# Patient Record
Sex: Female | Born: 1955 | Race: White | Hispanic: No | Smoking: Never smoker
Health system: Southern US, Community
[De-identification: ages and names within clinical notes are randomized; demographics above are authoritative.]

## PROBLEM LIST (undated history)

## (undated) DIAGNOSIS — K900 Celiac disease: Secondary | ICD-10-CM

## (undated) DIAGNOSIS — F50819 Binge eating disorder, unspecified: Secondary | ICD-10-CM

## (undated) DIAGNOSIS — E119 Type 2 diabetes mellitus without complications: Secondary | ICD-10-CM

## (undated) DIAGNOSIS — F419 Anxiety disorder, unspecified: Secondary | ICD-10-CM

## (undated) DIAGNOSIS — F32A Depression, unspecified: Secondary | ICD-10-CM

## (undated) DIAGNOSIS — F4310 Post-traumatic stress disorder, unspecified: Secondary | ICD-10-CM

## (undated) DIAGNOSIS — E7800 Pure hypercholesterolemia, unspecified: Secondary | ICD-10-CM

## (undated) DIAGNOSIS — M545 Low back pain, unspecified: Secondary | ICD-10-CM

## (undated) DIAGNOSIS — F319 Bipolar disorder, unspecified: Secondary | ICD-10-CM

## (undated) DIAGNOSIS — G4733 Obstructive sleep apnea (adult) (pediatric): Secondary | ICD-10-CM

## (undated) DIAGNOSIS — G43909 Migraine, unspecified, not intractable, without status migrainosus: Secondary | ICD-10-CM

## (undated) DIAGNOSIS — J45909 Unspecified asthma, uncomplicated: Secondary | ICD-10-CM

## (undated) DIAGNOSIS — M1990 Unspecified osteoarthritis, unspecified site: Secondary | ICD-10-CM

## (undated) DIAGNOSIS — F329 Major depressive disorder, single episode, unspecified: Secondary | ICD-10-CM

## (undated) DIAGNOSIS — J189 Pneumonia, unspecified organism: Secondary | ICD-10-CM

## (undated) DIAGNOSIS — D649 Anemia, unspecified: Secondary | ICD-10-CM

## (undated) DIAGNOSIS — I10 Essential (primary) hypertension: Secondary | ICD-10-CM

## (undated) DIAGNOSIS — G8929 Other chronic pain: Secondary | ICD-10-CM

## (undated) DIAGNOSIS — K589 Irritable bowel syndrome without diarrhea: Secondary | ICD-10-CM

## (undated) DIAGNOSIS — F5081 Binge eating disorder: Secondary | ICD-10-CM

## (undated) DIAGNOSIS — K219 Gastro-esophageal reflux disease without esophagitis: Secondary | ICD-10-CM

## (undated) DIAGNOSIS — F988 Other specified behavioral and emotional disorders with onset usually occurring in childhood and adolescence: Secondary | ICD-10-CM

## (undated) DIAGNOSIS — E0590 Thyrotoxicosis, unspecified without thyrotoxic crisis or storm: Secondary | ICD-10-CM

## (undated) HISTORY — DX: Type 2 diabetes mellitus without complications: E11.9

## (undated) HISTORY — DX: Other specified behavioral and emotional disorders with onset usually occurring in childhood and adolescence: F98.8

## (undated) HISTORY — PX: COLPOSCOPY: SHX161

## (undated) HISTORY — DX: Pure hypercholesterolemia, unspecified: E78.00

## (undated) HISTORY — PX: HAND SURGERY: SHX662

## (undated) HISTORY — DX: Binge eating disorder, unspecified: F50.819

## (undated) HISTORY — DX: Obstructive sleep apnea (adult) (pediatric): G47.33

## (undated) HISTORY — PX: OTHER SURGICAL HISTORY: SHX169

## (undated) HISTORY — DX: Migraine, unspecified, not intractable, without status migrainosus: G43.909

## (undated) HISTORY — DX: Low back pain, unspecified: M54.50

## (undated) HISTORY — DX: Other chronic pain: G89.29

## (undated) HISTORY — DX: Unspecified asthma, uncomplicated: J45.909

## (undated) HISTORY — DX: Binge eating disorder: F50.81

## (undated) HISTORY — PX: SHOULDER SURGERY: SHX246

## (undated) HISTORY — DX: Post-traumatic stress disorder, unspecified: F43.10

## (undated) HISTORY — DX: Thyrotoxicosis, unspecified without thyrotoxic crisis or storm: E05.90

---

## 2015-08-01 ENCOUNTER — Emergency Department (HOSPITAL_COMMUNITY)
Admission: EM | Admit: 2015-08-01 | Discharge: 2015-08-01 | Disposition: A | Discharge status: Home / Self Care | Payer: Self-pay | Attending: Emergency Medicine | Admitting: Emergency Medicine

## 2015-08-01 ENCOUNTER — Encounter (HOSPITAL_COMMUNITY): Payer: Self-pay | Admitting: General Practice

## 2015-08-01 ENCOUNTER — Emergency Department (HOSPITAL_COMMUNITY): Payer: Self-pay

## 2015-08-01 DIAGNOSIS — S92152A Displaced avulsion fracture (chip fracture) of left talus, initial encounter for closed fracture: Secondary | ICD-10-CM | POA: Insufficient documentation

## 2015-08-01 DIAGNOSIS — W109XXA Fall (on) (from) unspecified stairs and steps, initial encounter: Secondary | ICD-10-CM | POA: Insufficient documentation

## 2015-08-01 DIAGNOSIS — Y9301 Activity, walking, marching and hiking: Secondary | ICD-10-CM | POA: Insufficient documentation

## 2015-08-01 DIAGNOSIS — Y92239 Unspecified place in hospital as the place of occurrence of the external cause: Secondary | ICD-10-CM | POA: Insufficient documentation

## 2015-08-01 DIAGNOSIS — S93402A Sprain of unspecified ligament of left ankle, initial encounter: Secondary | ICD-10-CM | POA: Insufficient documentation

## 2015-08-01 DIAGNOSIS — I10 Essential (primary) hypertension: Secondary | ICD-10-CM | POA: Insufficient documentation

## 2015-08-01 DIAGNOSIS — Z8659 Personal history of other mental and behavioral disorders: Secondary | ICD-10-CM | POA: Insufficient documentation

## 2015-08-01 DIAGNOSIS — Y998 Other external cause status: Secondary | ICD-10-CM | POA: Insufficient documentation

## 2015-08-01 HISTORY — DX: Depression, unspecified: F32.A

## 2015-08-01 HISTORY — DX: Essential (primary) hypertension: I10

## 2015-08-01 HISTORY — DX: Bipolar disorder, unspecified: F31.9

## 2015-08-01 HISTORY — DX: Anxiety disorder, unspecified: F41.9

## 2015-08-01 HISTORY — DX: Major depressive disorder, single episode, unspecified: F32.9

## 2015-08-01 MED ORDER — HYDROCODONE-ACETAMINOPHEN 5-325 MG PO TABS: 1.0000 | ORAL_TABLET | Freq: Four times a day (QID) | ORAL | Status: DC | PRN

## 2015-08-01 MED ORDER — HYDROCODONE-ACETAMINOPHEN 5-325 MG PO TABS
2.0000 | ORAL_TABLET | Freq: Once | ORAL | Status: AC
  Administered 2015-08-01: 2 via ORAL
  Filled 2015-08-01: qty 2

## 2015-08-01 NOTE — Discharge Instructions (Signed)

## 2015-08-01 NOTE — Progress Notes (Signed)
Orthopedic Tech Progress Note Patient Details:  Paige Andrews [birth year] [id number]  Ortho Devices Type of Ortho Device: Ace wrap, Post (short leg) splint Ortho Device/Splint Location: LLE Ortho Device/Splint Interventions: Ordered, Application   Jennye Moccasin 08/01/2015, 8:52 PM

## 2015-08-01 NOTE — ED Notes (Signed)
Pt brought in via [facility] with complaints of left ankle and left knee pain. Pt was picking up boyfriend at [facility] hospital and tripped over a step and fell. Pt went home, hoping the pain would subside. Pt denies any loss of consciousness. Pt able to move all extremities. Pt has palpable pedal pulses and sensation. Pt has a large old bruise on left hip from a previous fall.

## 2015-08-01 NOTE — ED Notes (Signed)
Ortho paged and called back- informed of need for LEFT short posterior leg splint application

## 2015-08-01 NOTE — ED Notes (Signed)
Ortho tech at bedside 
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
## 2015-09-04 ENCOUNTER — Emergency Department (HOSPITAL_BASED_OUTPATIENT_CLINIC_OR_DEPARTMENT_OTHER): Payer: Self-pay

## 2015-09-04 ENCOUNTER — Encounter (HOSPITAL_BASED_OUTPATIENT_CLINIC_OR_DEPARTMENT_OTHER): Payer: Self-pay | Admitting: *Deleted

## 2015-09-04 ENCOUNTER — Emergency Department (HOSPITAL_BASED_OUTPATIENT_CLINIC_OR_DEPARTMENT_OTHER)
Admission: EM | Admit: 2015-09-04 | Discharge: 2015-09-04 | Disposition: A | Discharge status: Home / Self Care | Payer: Self-pay | Attending: Emergency Medicine | Admitting: Emergency Medicine

## 2015-09-04 DIAGNOSIS — Y998 Other external cause status: Secondary | ICD-10-CM | POA: Insufficient documentation

## 2015-09-04 DIAGNOSIS — F419 Anxiety disorder, unspecified: Secondary | ICD-10-CM | POA: Insufficient documentation

## 2015-09-04 DIAGNOSIS — T148XXA Other injury of unspecified body region, initial encounter: Secondary | ICD-10-CM

## 2015-09-04 DIAGNOSIS — R202 Paresthesia of skin: Secondary | ICD-10-CM | POA: Insufficient documentation

## 2015-09-04 DIAGNOSIS — Y9389 Activity, other specified: Secondary | ICD-10-CM | POA: Insufficient documentation

## 2015-09-04 DIAGNOSIS — S60221A Contusion of right hand, initial encounter: Secondary | ICD-10-CM | POA: Insufficient documentation

## 2015-09-04 DIAGNOSIS — S63616A Unspecified sprain of right little finger, initial encounter: Secondary | ICD-10-CM | POA: Insufficient documentation

## 2015-09-04 DIAGNOSIS — S63619A Unspecified sprain of unspecified finger, initial encounter: Secondary | ICD-10-CM

## 2015-09-04 DIAGNOSIS — I10 Essential (primary) hypertension: Secondary | ICD-10-CM | POA: Insufficient documentation

## 2015-09-04 DIAGNOSIS — Y9289 Other specified places as the place of occurrence of the external cause: Secondary | ICD-10-CM | POA: Insufficient documentation

## 2015-09-04 MED ORDER — KETOROLAC TROMETHAMINE 60 MG/2ML IM SOLN
30.0000 mg | Freq: Once | INTRAMUSCULAR | Status: AC
  Administered 2015-09-04: 30 mg via INTRAMUSCULAR
  Filled 2015-09-04: qty 2

## 2015-09-04 MED ORDER — OXYCODONE-ACETAMINOPHEN 5-325 MG PO TABS
1.0000 | ORAL_TABLET | Freq: Once | ORAL | Status: DC
  Filled 2015-09-04: qty 1
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
## 2015-09-04 NOTE — Discharge Instructions (Signed)
Boxer's Knuckle Boxer's knuckle involves pain and weakness over the knuckle of an injured hand. The pain is due to injury of the tendon that straightens the finger (extensor tendon). Normally, a layer of tissue lies over the tendon to keep it in place. Damage to this tissue may allow the extensor tendon to move out of proper position (sublux). This causes the symptoms of boxer's knuckle. Boxer's knuckle often occurs at the first knuckle of the middle finger. SYMPTOMS   Pain and tenderness over the injured knuckle.  Difficulty straightening the finger, without assistance.  Full passive motion of the finger (can be moved with assistance).  Swelling and warmth of the injured finger, over the knuckle. CAUSES  Direct or repeated injury (trauma) to the knuckle, such as with boxing or martial arts, that causes damage to the extensor tendon or the tissue that holds it in place.  RISK INCREASES WITH:   Hitting or fighting sports (boxing, martial arts).  Contact sports (football, rugby).  Poor strength and flexibility.  Previous or concurrent injury to the knuckle. PREVENTION  Maintain appropriate conditioning:  Hand and finger flexibility.  Muscle strength and endurance.  Wear protective equipment (padded gloves). PROGNOSIS  This condition usually heals with splinting or surgery.  RELATED COMPLICATIONS   Permanent loss of full range of motion.  Stiffness of finger.  Weakness of the hand and finger.  Tendon rupture (tearing).  Arthritis of the joint.  Frequently recurring symptoms and repeated injury. Fixing the problem the first time may decrease the chances of recurrence and optimize healing time.  Longer healing time, if not treated properly.  Injury to other structures (bone, cartilage, tendon), and ongoing (chronic) unstableness.  Prolonged impairment (sometimes).  Risks of surgery: infection, injury to nerves (numbness, weakness), bleeding, or problem recurring after  surgery. TREATMENT  Treatment first involves restraining the joint, icing, and elevation of the finger, at or above heart level, to reduce inflammation. Medicines may be given for pain. Often, surgery is advised to repair the injured tissues. After surgery, restraint of the joint is achieved with a cast, brace, or splint. This may allow the tissues to heal. After restraint (with or without surgery), stretching and strengthening exercises are needed to regain complete use of the knuckle. Exercises may be completed at home or with the assistance of a therapist or athletic trainer. Return to sports is allowed after full range of motion and normal strength are achieved, usually after 4 months.  MEDICATION   If pain medication is necessary, nonsteroidal anti-inflammatory medications, such as aspirin and ibuprofen, or other minor pain relievers, such as acetaminophen, are often recommended.  Do not take pain medicine for 7 days before surgery.  Prescription pain relievers are usually prescribed only after surgery. Use only as directed and only as much as you need. SEEK MEDICAL CARE IF:   Pain increases, despite treatment.  Any of the following occur after surgery:  Pain, numbness, or coldness in the finger.  Blue, gray, or dark color appears in the fingernails.  Signs of infection: fever, increased pain, swelling, redness, drainage of fluids, or bleeding in the affected area.  New, unexplained symptoms develop. (Drugs used in treatment may produce side effects.) Document Released: 11/27/2005 Document Revised: 02/19/2012 Document Reviewed: 03/11/2009 [facility] Patient Information 2015 [place], [place]. This information is not intended to replace advice given to you by your health care provider. Make sure you discuss any questions you have with your health care provider.

## 2015-09-04 NOTE — ED Notes (Signed)
Patient transported to X-ray 

## 2015-09-08 ENCOUNTER — Ambulatory Visit: Payer: Self-pay | Admitting: Family Medicine

## 2015-09-10 ENCOUNTER — Ambulatory Visit: Payer: Self-pay | Admitting: Family Medicine

## 2015-09-13 ENCOUNTER — Encounter: Payer: Self-pay | Admitting: Family Medicine

## 2015-09-13 ENCOUNTER — Ambulatory Visit (INDEPENDENT_AMBULATORY_CARE_PROVIDER_SITE_OTHER): Payer: Self-pay | Admitting: Family Medicine

## 2015-09-13 VITALS — BP 123/89 | HR 111 | Ht 64.0 in | Wt 200.0 lb

## 2015-09-13 DIAGNOSIS — R21 Rash and other nonspecific skin eruption: Secondary | ICD-10-CM

## 2015-09-13 DIAGNOSIS — S6991XA Unspecified injury of right wrist, hand and finger(s), initial encounter: Secondary | ICD-10-CM

## 2015-09-13 MED ORDER — PERMETHRIN 5 % EX CREA: 1.0000 "application " | TOPICAL_CREAM | Freq: Once | CUTANEOUS | Status: DC

## 2015-09-13 MED ORDER — TRAMADOL HCL 50 MG PO TABS
50.0000 mg | ORAL_TABLET | Freq: Four times a day (QID) | ORAL | Status: DC | PRN
Start: 2015-09-13 — End: 2015-10-07

## 2015-09-13 NOTE — Patient Instructions (Signed)
Ice as needed 15 minutes at a time 3-4 times a day. Ibuprofen  three times a day with food as needed for pain and inflammation. Tylenol  1-2 tabs three times a day as needed for pain. Tramadol as needed for severe pain - no driving on this medicine though. Work on regaining your finger motion with easy flexion, extension exercises. Follow up with me in 2 weeks. Consider buddy taping your little and ring fingers together if needed.

## 2015-09-14 DIAGNOSIS — S6991XA Unspecified injury of right wrist, hand and finger(s), initial encounter: Secondary | ICD-10-CM | POA: Insufficient documentation

## 2015-09-14 DIAGNOSIS — R21 Rash and other nonspecific skin eruption: Secondary | ICD-10-CM | POA: Insufficient documentation

## 2015-09-14 NOTE — Assessment & Plan Note (Signed)
Lower extremity rash - intensely pruritic rash only lower legs below knees.  Advised we trial permethrin topically to these areas - if she notices this resolves the rash advised to use over entire body, wash sheets and clothes in hot water, place aside in plastic bag for 2 weeks.
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
## 2015-09-14 NOTE — Assessment & Plan Note (Signed)
radiographs show possible 5th proximal phalanx fracture but otherwise normal.  Advised icing, ibuprofen, tylenol, tramadol as needed.  Reviewed buddy taping of 4th and 5th digits.  Start easy motion exercises now, discontinue immobilization.  F/u in 2 weeks to reevaluate.

## 2015-09-27 ENCOUNTER — Ambulatory Visit: Payer: Self-pay | Admitting: Family Medicine

## 2015-10-07 ENCOUNTER — Ambulatory Visit (INDEPENDENT_AMBULATORY_CARE_PROVIDER_SITE_OTHER): Payer: Self-pay | Admitting: Family Medicine

## 2015-10-07 ENCOUNTER — Encounter: Payer: Self-pay | Admitting: Family Medicine

## 2015-10-07 VITALS — BP 130/92 | HR 101 | Ht 64.0 in | Wt 200.0 lb

## 2015-10-07 DIAGNOSIS — S6991XD Unspecified injury of right wrist, hand and finger(s), subsequent encounter: Secondary | ICD-10-CM

## 2015-10-07 MED ORDER — NORTRIPTYLINE HCL 25 MG PO CAPS: 25.0000 mg | ORAL_CAPSULE | Freq: Every day | ORAL | Status: DC

## 2015-10-07 NOTE — Patient Instructions (Signed)
Try nortriptyline at bedtime for pain - can make you sleepy. Ice as needed 15 minutes at a time 3-4 times a day. Ibuprofen 600mg  three times a day with food as needed for pain and inflammation. Tylenol 500mg  1-2 tabs three times a day as needed for pain. We will try the extension splint - wear at all times for next 6 weeks - if you take this off, keep the joint extended and wash gently. Follow up with me in 6 weeks though if you tolerate the nortriptyline you can call me sooner to increase the dose. We can consider an MRI, physical therapy depending on insurance coverage in the future.
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
## 2015-10-12 NOTE — Assessment & Plan Note (Signed)
radiographs showed possible 5th proximal phalanx fracture but otherwise normal.  She has some difficulty with extension at 5th PIP now though able to do so against resistance.  Advised as a precaution would extension splint at this joint for 6 weeks regularly (at all times).  Icing, tylenol, ibuprofen.  She asked about something additional for pain - getting shooting pains up the arm.  We will try nortriptyline at a low dose at bedtime.  F/u in 6 weeks.  Consider MRI, physical therapy if not improving.

## 2015-10-15 ENCOUNTER — Telehealth: Payer: Self-pay | Admitting: Family Medicine

## 2015-10-15 NOTE — Telephone Encounter (Signed)
She will have to establish with a family physician to discuss restless leg syndrome.  Can refer her to [facility] downstairs - all the physicians and PAs, NP down there are great.  Regarding her hand, we reviewed treatment - continue the splint, tylenol, ibuprofen if needed.  We prescribed nortriptyline.  Is she taking this?  If she's tolerating it we can increase it.  We can try a different anti-inflammatory than ibuprofen also.

## 2015-10-15 NOTE — Telephone Encounter (Signed)
Unable to contact patient.

## 2015-11-18 ENCOUNTER — Ambulatory Visit: Payer: Self-pay | Admitting: Family Medicine

## 2017-01-20 ENCOUNTER — Encounter (HOSPITAL_COMMUNITY): Payer: Self-pay | Admitting: *Deleted

## 2017-01-20 ENCOUNTER — Emergency Department (HOSPITAL_COMMUNITY): Payer: Self-pay

## 2017-01-20 DIAGNOSIS — Z79899 Other long term (current) drug therapy: Secondary | ICD-10-CM

## 2017-01-20 DIAGNOSIS — K5900 Constipation, unspecified: Secondary | ICD-10-CM | POA: Diagnosis present

## 2017-01-20 DIAGNOSIS — E860 Dehydration: Secondary | ICD-10-CM | POA: Diagnosis present

## 2017-01-20 DIAGNOSIS — F319 Bipolar disorder, unspecified: Secondary | ICD-10-CM | POA: Diagnosis present

## 2017-01-20 DIAGNOSIS — F419 Anxiety disorder, unspecified: Secondary | ICD-10-CM | POA: Diagnosis present

## 2017-01-20 DIAGNOSIS — R1011 Right upper quadrant pain: Secondary | ICD-10-CM | POA: Diagnosis present

## 2017-01-20 DIAGNOSIS — E871 Hypo-osmolality and hyponatremia: Secondary | ICD-10-CM | POA: Diagnosis present

## 2017-01-20 DIAGNOSIS — E872 Acidosis: Secondary | ICD-10-CM | POA: Diagnosis present

## 2017-01-20 DIAGNOSIS — E876 Hypokalemia: Secondary | ICD-10-CM | POA: Diagnosis present

## 2017-01-20 DIAGNOSIS — R112 Nausea with vomiting, unspecified: Secondary | ICD-10-CM | POA: Diagnosis present

## 2017-01-20 DIAGNOSIS — R739 Hyperglycemia, unspecified: Secondary | ICD-10-CM | POA: Diagnosis present

## 2017-01-20 DIAGNOSIS — Z66 Do not resuscitate: Secondary | ICD-10-CM | POA: Diagnosis present

## 2017-01-20 DIAGNOSIS — K589 Irritable bowel syndrome without diarrhea: Secondary | ICD-10-CM | POA: Diagnosis present

## 2017-01-20 DIAGNOSIS — Z885 Allergy status to narcotic agent status: Secondary | ICD-10-CM

## 2017-01-20 DIAGNOSIS — Z8659 Personal history of other mental and behavioral disorders: Secondary | ICD-10-CM

## 2017-01-20 DIAGNOSIS — K869 Disease of pancreas, unspecified: Principal | ICD-10-CM | POA: Diagnosis present

## 2017-01-20 DIAGNOSIS — I10 Essential (primary) hypertension: Secondary | ICD-10-CM | POA: Diagnosis present

## 2017-01-20 DIAGNOSIS — R7989 Other specified abnormal findings of blood chemistry: Secondary | ICD-10-CM | POA: Diagnosis present

## 2017-01-20 DIAGNOSIS — Z8379 Family history of other diseases of the digestive system: Secondary | ICD-10-CM

## 2017-01-20 DIAGNOSIS — R0789 Other chest pain: Secondary | ICD-10-CM | POA: Diagnosis present

## 2017-01-20 DIAGNOSIS — R1013 Epigastric pain: Secondary | ICD-10-CM | POA: Diagnosis present

## 2017-01-20 LAB — CBC
HCT: 42.3 % (ref 36.0–46.0)
Hemoglobin: 15.4 g/dL — ABNORMAL HIGH (ref 12.0–15.0)
MCH: 34.5 pg — ABNORMAL HIGH (ref 26.0–34.0)
MCHC: 36.4 g/dL — ABNORMAL HIGH (ref 30.0–36.0)
MCV: 94.8 fL (ref 78.0–100.0)
PLATELETS: 174 10*3/uL (ref 150–400)
RBC: 4.46 MIL/uL (ref 3.87–5.11)
RDW: 13.5 % (ref 11.5–15.5)
WBC: 10.3 10*3/uL (ref 4.0–10.5)

## 2017-01-20 LAB — I-STAT TROPONIN, ED: Troponin i, poc: 0 ng/mL (ref 0.00–0.08)

## 2017-01-20 NOTE — ED Triage Notes (Signed)
Pt says that she has had pain in her abdomen, back and chest. She has had multiple episodes of vomiting. She has also had a productive cough.

## 2017-01-20 NOTE — ED Triage Notes (Signed)
PT arrives via EMS, per their report pt has had abdominal pain, n/v and body aches for 2 weeks. 154/90, hr 120, 98% room.

## 2017-01-21 ENCOUNTER — Encounter (HOSPITAL_COMMUNITY): Payer: Self-pay | Admitting: *Deleted

## 2017-01-21 ENCOUNTER — Inpatient Hospital Stay (HOSPITAL_COMMUNITY): Payer: Self-pay

## 2017-01-21 ENCOUNTER — Emergency Department (HOSPITAL_COMMUNITY): Payer: Self-pay

## 2017-01-21 ENCOUNTER — Inpatient Hospital Stay (HOSPITAL_COMMUNITY)
Admission: EM | Admit: 2017-01-21 | Discharge: 2017-01-23 | DRG: 439 | Disposition: A | Discharge status: Home / Self Care | Payer: Self-pay | Attending: Internal Medicine | Admitting: Internal Medicine

## 2017-01-21 DIAGNOSIS — K5900 Constipation, unspecified: Secondary | ICD-10-CM

## 2017-01-21 DIAGNOSIS — F418 Other specified anxiety disorders: Secondary | ICD-10-CM

## 2017-01-21 DIAGNOSIS — R52 Pain, unspecified: Secondary | ICD-10-CM

## 2017-01-21 DIAGNOSIS — R112 Nausea with vomiting, unspecified: Secondary | ICD-10-CM | POA: Diagnosis present

## 2017-01-21 DIAGNOSIS — Q453 Other congenital malformations of pancreas and pancreatic duct: Secondary | ICD-10-CM

## 2017-01-21 DIAGNOSIS — R7401 Elevation of levels of liver transaminase levels: Secondary | ICD-10-CM | POA: Diagnosis present

## 2017-01-21 DIAGNOSIS — R740 Nonspecific elevation of levels of transaminase and lactic acid dehydrogenase [LDH]: Secondary | ICD-10-CM

## 2017-01-21 HISTORY — DX: Irritable bowel syndrome, unspecified: K58.9

## 2017-01-21 HISTORY — DX: Celiac disease: K90.0

## 2017-01-21 LAB — COMPREHENSIVE METABOLIC PANEL
ALT: 119 U/L — AB (ref 14–54)
AST: 80 U/L — AB (ref 15–41)
Albumin: 4.2 g/dL (ref 3.5–5.0)
Alkaline Phosphatase: 67 U/L (ref 38–126)
Anion gap: 12 (ref 5–15)
BILIRUBIN TOTAL: 0.9 mg/dL (ref 0.3–1.2)
BUN: 9 mg/dL (ref 6–20)
CALCIUM: 9.5 mg/dL (ref 8.9–10.3)
CHLORIDE: 101 mmol/L (ref 101–111)
CO2: 18 mmol/L — ABNORMAL LOW (ref 22–32)
CREATININE: 0.79 mg/dL (ref 0.44–1.00)
Glucose, Bld: 181 mg/dL — ABNORMAL HIGH (ref 65–99)
Potassium: 3.4 mmol/L — ABNORMAL LOW (ref 3.5–5.1)
Sodium: 131 mmol/L — ABNORMAL LOW (ref 135–145)
TOTAL PROTEIN: 7.7 g/dL (ref 6.5–8.1)

## 2017-01-21 LAB — URINALYSIS, ROUTINE W REFLEX MICROSCOPIC
Bilirubin Urine: NEGATIVE
GLUCOSE, UA: 150 mg/dL — AB
HGB URINE DIPSTICK: NEGATIVE
Ketones, ur: 5 mg/dL — AB
Leukocytes, UA: NEGATIVE
NITRITE: NEGATIVE
PH: 5 (ref 5.0–8.0)
PROTEIN: 30 mg/dL — AB
SPECIFIC GRAVITY, URINE: 1.025 (ref 1.005–1.030)

## 2017-01-21 LAB — INFLUENZA PANEL BY PCR (TYPE A & B)
INFLBPCR: NEGATIVE
Influenza A By PCR: NEGATIVE

## 2017-01-21 LAB — LIPASE, BLOOD: LIPASE: 19 U/L (ref 11–51)

## 2017-01-21 MED ORDER — SODIUM CHLORIDE 0.9 % IV SOLN
INTRAVENOUS | Status: DC
  Administered 2017-01-21 (×2): via INTRAVENOUS

## 2017-01-21 MED ORDER — POTASSIUM CHLORIDE 2 MEQ/ML IV SOLN
30.0000 meq | Freq: Once | INTRAVENOUS | Status: AC
  Administered 2017-01-21: 30 meq via INTRAVENOUS
  Filled 2017-01-21: qty 15

## 2017-01-21 MED ORDER — POLYETHYLENE GLYCOL 3350 17 G PO PACK
17.0000 g | PACK | Freq: Two times a day (BID) | ORAL | Status: DC
  Administered 2017-01-21 – 2017-01-22 (×3): 17 g via ORAL
  Filled 2017-01-21 (×4): qty 1

## 2017-01-21 MED ORDER — KETOROLAC TROMETHAMINE 30 MG/ML IJ SOLN
30.0000 mg | Freq: Once | INTRAMUSCULAR | Status: AC
  Administered 2017-01-21: 30 mg via INTRAVENOUS
  Filled 2017-01-21: qty 1

## 2017-01-21 MED ORDER — BISACODYL 10 MG RE SUPP
20.0000 mg | Freq: Once | RECTAL | Status: AC
  Administered 2017-01-21: 20 mg via RECTAL
  Filled 2017-01-21: qty 2

## 2017-01-21 MED ORDER — GADOBENATE DIMEGLUMINE 529 MG/ML IV SOLN
19.0000 mL | Freq: Once | INTRAVENOUS | Status: AC | PRN
  Administered 2017-01-21: 19 mL via INTRAVENOUS

## 2017-01-21 MED ORDER — MORPHINE SULFATE (PF) 4 MG/ML IV SOLN: 2.0000 mg | INTRAVENOUS | Status: DC | PRN

## 2017-01-21 MED ORDER — HYDROMORPHONE HCL 2 MG/ML IJ SOLN
0.5000 mg | INTRAMUSCULAR | Status: DC | PRN
  Administered 2017-01-21 – 2017-01-22 (×5): 0.5 mg via INTRAVENOUS
  Filled 2017-01-21 (×5): qty 1

## 2017-01-21 MED ORDER — DOCUSATE SODIUM 100 MG PO CAPS
100.0000 mg | ORAL_CAPSULE | Freq: Two times a day (BID) | ORAL | Status: DC
  Administered 2017-01-21 – 2017-01-23 (×5): 100 mg via ORAL
  Filled 2017-01-21 (×5): qty 1

## 2017-01-21 MED ORDER — ONDANSETRON HCL 4 MG/2ML IJ SOLN
4.0000 mg | Freq: Four times a day (QID) | INTRAMUSCULAR | Status: DC | PRN
Start: 2017-01-21 — End: 2017-01-21
  Administered 2017-01-21: 4 mg via INTRAVENOUS
  Filled 2017-01-21: qty 2

## 2017-01-21 MED ORDER — KCL IN DEXTROSE-NACL 10-5-0.45 MEQ/L-%-% IV SOLN
INTRAVENOUS | Status: DC
  Administered 2017-01-21 – 2017-01-22 (×3): via INTRAVENOUS
  Filled 2017-01-21 (×4): qty 1000

## 2017-01-21 MED ORDER — FENTANYL CITRATE (PF) 100 MCG/2ML IJ SOLN
50.0000 ug | Freq: Once | INTRAMUSCULAR | Status: AC
  Administered 2017-01-21: 50 ug via INTRAVENOUS
  Filled 2017-01-21: qty 2

## 2017-01-21 MED ORDER — PROMETHAZINE HCL 25 MG/ML IJ SOLN
12.5000 mg | Freq: Four times a day (QID) | INTRAMUSCULAR | Status: DC | PRN
  Administered 2017-01-21 – 2017-01-22 (×3): 12.5 mg via INTRAVENOUS
  Filled 2017-01-21 (×3): qty 1

## 2017-01-21 MED ORDER — OXYCODONE HCL 5 MG PO TABS
5.0000 mg | ORAL_TABLET | ORAL | Status: DC | PRN
  Administered 2017-01-21 – 2017-01-23 (×10): 10 mg via ORAL
  Filled 2017-01-21 (×10): qty 2

## 2017-01-21 MED ORDER — SODIUM CHLORIDE 0.9 % IV BOLUS (SEPSIS)
500.0000 mL | Freq: Once | INTRAVENOUS | Status: AC
  Administered 2017-01-21: 500 mL via INTRAVENOUS

## 2017-01-21 MED ORDER — SODIUM CHLORIDE 0.9 % IV SOLN
8.0000 mg | Freq: Three times a day (TID) | INTRAVENOUS | Status: DC
  Administered 2017-01-21 – 2017-01-23 (×6): 8 mg via INTRAVENOUS
  Filled 2017-01-21 (×8): qty 4

## 2017-01-21 MED ORDER — ENOXAPARIN SODIUM 40 MG/0.4ML ~~LOC~~ SOLN
40.0000 mg | SUBCUTANEOUS | Status: DC
  Administered 2017-01-21 – 2017-01-22 (×2): 40 mg via SUBCUTANEOUS
  Filled 2017-01-21 (×2): qty 0.4

## 2017-01-21 MED ORDER — ONDANSETRON HCL 4 MG/2ML IJ SOLN
4.0000 mg | Freq: Once | INTRAMUSCULAR | Status: AC
  Administered 2017-01-21: 4 mg via INTRAVENOUS
  Filled 2017-01-21: qty 2

## 2017-01-21 NOTE — ED Notes (Signed)
Bed: WA03 Expected date:  Expected time:  Means of arrival:  Comments: 

## 2017-01-21 NOTE — Progress Notes (Signed)
PROGRESS NOTE  Paige Andrews  YHC:[id number] DOB: 1956/05/17 DOA: 01/21/2017 PCP: No PCP Per Patient  Brief Narrative:   Paige Andrews is a 61 y.o. female with medical history significant of HTN, pancreatitis, and IBS who presents to the Emergency Department complaining of progressively worsening, constant epigastric pain x 2 weeks but worsened significantly a few days prior to admission.  She has had associated back pain, nausea, vomiting.  She has had a 5-6-lb weight loss over the last month.  CT demonstrated a concerning pancreatic mass.  MRI shows an area of focal scarring/atrophy in the mid/distal pancreatic tail with adherence to adjacent structures including the stomach. She has had previous pancreatitis which could explain scar, however, pancreatic mass was not excluded.  GI consulted for possible EUS.    Assessment & Plan:   Principal Problem:   Pancreatic abnormality Active Problems:   Nausea & vomiting   Transaminitis  Nausea, vomiting and abdominal pain in setting of pancreatic mass/scarring.  LFTs mildly elevated but bilirubin and lipase are normal.  Symptoms may be related to IBS, recent virus, constipation. -  Continue IVF -  Schedule zofran and add prn phenergan -  Intolerant to IV morphine so will start low dose IV dilaudid for pain management pending further evaluation -  [facility]:  Dr. Paulita Fujita is out of town until at least next week -  [facility]:  Dr. Ardis Hughs is booked through the next two weeks for EUS -  I will investigate if any other GI groups in the area could facilitate EUS sooner -  Will try to control symptoms, but if refractory, may need transfer to tertiary care center -  [facility] -  CA-19-9  Constipation -  Bisacodyl suppositories  -  Enema since no BM -  Start miralax, colace  Hyperglycemia -  Check A1c -  SSI  Hyponatremia with metabolic acidosis, likely due to dehydration -  IVF -  Repeat BMP in AM  Hypokalemia -  Potassium in IVF  DVT prophylaxis:   lovenox Code Status:  DNR Family Communication:  Patient alone Disposition Plan:  Pending tolerate diet/fluids and oral medications.     Consultants:   none  Procedures:  none  Antimicrobials:  Anti-infectives    None       Subjective: Ongoing nausea.  No vomiting since coming to floor.  Has not been able to keep pills or food down for several days.  Ongoing epigastric pain that radiates to her back.  No BM in 3-4 days.    Objective: Vitals:   01/21/17 0600 01/21/17 0700 01/21/17 0730 01/21/17 0735  BP: (!) 163/108 136/81 (!) 158/93   Pulse: 100 91 (!) 104   Resp:   18   Temp:   97.9 F (36.6 C)   TempSrc:   Oral   SpO2: 98% 98% 100%   Weight:   87.7 kg (193 lb 4.8 oz)   Height:    5' 4"  (1.626 m)   No intake or output data in the 24 hours ending 01/21/17 1337 Filed Weights   01/21/17 0730  Weight: 87.7 kg (193 lb 4.8 oz)    Examination:  General exam:  Adult female, lying in bed.  No acute distress.  HEENT:  NCAT, MMM Respiratory system: Clear to auscultation bilaterally Cardiovascular system: Regular rate and rhythm, normal S1/S2. No murmurs, rubs, gallops or clicks.  Warm extremities Gastrointestinal system: Normal active bowel sounds, soft, nondistended, TTP very high up in epigastric area to below the  xyphoid process.  No rebound or guarding.   MSK:  Normal tone and bulk, no lower extremity edema Neuro:  Grossly intact    Data Reviewed: I have personally reviewed following labs and imaging studies  CBC:  Recent Labs Lab 01/20/17 2324  WBC 10.3  HGB 15.4*  HCT 42.3  MCV 94.8  PLT 683   Basic Metabolic Panel:  Recent Labs Lab 01/20/17 2324  NA 131*  K 3.4*  CL 101  CO2 18*  GLUCOSE 181*  BUN 9  CREATININE 0.79  CALCIUM 9.5   GFR: Estimated Creatinine Clearance: 80.2 mL/min (by C-G formula based on SCr of 0.79 mg/dL). Liver Function Tests:  Recent Labs Lab 01/20/17 2324  AST 80*  ALT 119*  ALKPHOS 67  BILITOT 0.9  PROT 7.7    ALBUMIN 4.2    Recent Labs Lab 01/20/17 2324  LIPASE 19   No results for input(s): AMMONIA in the last 168 hours. Coagulation Profile: No results for input(s): INR, PROTIME in the last 168 hours. Cardiac Enzymes: No results for input(s): CKTOTAL, CKMB, CKMBINDEX, TROPONINI in the last 168 hours. BNP (last 3 results) No results for input(s): PROBNP in the last 8760 hours. HbA1C: No results for input(s): HGBA1C in the last 72 hours. CBG: No results for input(s): GLUCAP in the last 168 hours. Lipid Profile: No results for input(s): CHOL, HDL, LDLCALC, TRIG, CHOLHDL, LDLDIRECT in the last 72 hours. Thyroid Function Tests: No results for input(s): TSH, T4TOTAL, FREET4, T3FREE, THYROIDAB in the last 72 hours. Anemia Panel: No results for input(s): VITAMINB12, FOLATE, FERRITIN, TIBC, IRON, RETICCTPCT in the last 72 hours. Urine analysis:    Component Value Date/Time   COLORURINE AMBER (A) 01/21/2017 0049   APPEARANCEUR HAZY (A) 01/21/2017 0049   LABSPEC 1.025 01/21/2017 0049   PHURINE 5.0 01/21/2017 0049   GLUCOSEU 150 (A) 01/21/2017 0049   HGBUR NEGATIVE 01/21/2017 0049   BILIRUBINUR NEGATIVE 01/21/2017 0049   KETONESUR 5 (A) 01/21/2017 0049   PROTEINUR 30 (A) 01/21/2017 0049   NITRITE NEGATIVE 01/21/2017 0049   LEUKOCYTESUR NEGATIVE 01/21/2017 0049   Sepsis Labs: @LABRCNTIP (procalcitonin:4,lacticidven:4)  )No results found for this or any previous visit (from the past 240 hour(s)).    Radiology Studies: Dg Chest 2 View  Result Date: 01/20/2017 CLINICAL DATA:  Cough, shortness of breath, chest pain EXAM: CHEST  2 VIEW COMPARISON:  None. FINDINGS: Lungs are clear.  No pleural effusion or pneumothorax. The heart is normal in size. Mild superior endplate compression fracture deformity at L2, age indeterminate. IMPRESSION: No evidence of acute cardiopulmonary disease. Mild superior endplate compression fracture deformity at L2, age indeterminate. Electronically Signed   By:  Julian Hy M.D.   On: 01/20/2017 23:50   Mr Abdomen W Wo Contrast  Result Date: 01/21/2017 CLINICAL DATA:  Worsening abdominal pain x2 weeks, nausea/vomiting, back pain. Abnormal pancreas on CT, evaluate for underlying pancreatic neoplasm. EXAM: MRI ABDOMEN WITHOUT AND WITH CONTRAST TECHNIQUE: Multiplanar multisequence MR imaging of the abdomen was performed both before and after the administration of intravenous contrast. CONTRAST:  78m MULTIHANCE GADOBENATE DIMEGLUMINE 529 MG/ML IV SOLN COMPARISON:  Unenhanced CT abdomen/ pelvis dated 01/21/2017 FINDINGS: Lower chest: Lung bases are clear. Hepatobiliary: Liver is within normal limits. No suspicious/enhancing hepatic lesions. Gallbladder is unremarkable. No intrahepatic or extrahepatic ductal dilatation Pancreas: Pancreatic head/ uncinate process and proximal pancreatic body are within normal limits. However, there is abrupt cut off of the pancreatic tail with surrounding scarring and adherence of adjacent structures, including the  stomach (series 1203/ image 48). It is unclear whether this reflects an underlying hypoenhancing mass with fibrosis or is simply the sequela of prior inflammation/ pancreatitis. No superimposed acute inflammation is evident. No peripancreatic fluid/ pseudocyst. No associated pancreatic ductal dilatation. Spleen:  Within normal limits. Adrenals/Urinary Tract:  Adrenal glands are within normal limits. Kidneys are within normal limits.  No hydronephrosis. Stomach/Bowel: Mild angulation of the mid gastric body which is adherent to the pancreatic tail. Stomach is otherwise within normal limits. Visualized bowel is unremarkable. Vascular/Lymphatic:  No evidence of abdominal aortic aneurysm. No suspicious abdominal lymphadenopathy. Small peripancreatic lymph nodes measuring up to 7 mm Joren Rehm axis in the left upper abdomen (Series 5/ image 21). Other:  No abdominal ascites. Musculoskeletal: No focal osseous lesions. IMPRESSION: Focal  scarring/ atrophy involving the mid/ distal pancreatic tail, with adherence of adjacent structures, including the stomach. While this may reflect sequela of prior pancreatitis in the appropriate clinical setting, underlying pancreatic mass is not excluded. EUS is suggested for further evaluation. Adjacent small peripancreatic lymph nodes measuring up to 7 mm Farron Watrous axis, nonspecific. Electronically Signed   By: Julian Hy M.D.   On: 01/21/2017 12:41   US Abdomen Complete  Result Date: 01/21/2017 CLINICAL DATA:  61 y/o F; chest pain and abdominal pain with nausea and vomiting. EXAM: ABDOMEN ULTRASOUND COMPLETE COMPARISON:  None. FINDINGS: Gallbladder: No gallstones or wall thickening visualized. No sonographic Murphy sign noted by sonographer. Common bile duct: Diameter: 3.1 mm Liver: Diffusely increased echogenicity.  No focal lesion. IVC: No abnormality visualized. Pancreas: Largely obscured by bowel gas. Spleen: Size and appearance within normal limits. Right Kidney: Length: 11.2 cm. Echogenicity within normal limits. No mass or hydronephrosis visualized. Left Kidney: Length: 12.4 cm. Echogenicity within normal limits. No mass or hydronephrosis visualized. Abdominal aorta: Obscured by bowel gas. Visualized segment measures 2.6 cm, within normal limits. Other findings: None. IMPRESSION: 1. No acute process identified. 2. Hepatic steatosis. Electronically Signed   By: Kristine Garbe M.D.   On: 01/21/2017 02:08   Ct Renal Stone Study  Result Date: 01/21/2017 CLINICAL DATA:  Constant in worsening epigastric pain for 2 weeks. Back pain, nausea, vomiting, generalized body aches, and constipation. EXAM: CT ABDOMEN AND PELVIS WITHOUT CONTRAST TECHNIQUE: Multidetector CT imaging of the abdomen and pelvis was performed following the standard protocol without IV contrast. COMPARISON:  Ultrasound 01/21/2017 FINDINGS: Lower chest: Lung bases are clear. Hepatobiliary: No focal liver abnormality is seen.  No gallstones, gallbladder wall thickening, or biliary dilatation. Pancreas: Linear infiltration around the pancreas with scarring and vascular retraction centered at the junction of the body and tail. Mildly enlarged celiac axis and peripancreatic lymph nodes. This may represent acute pancreatitis but an infiltrating mass lesion is not excluded. Consider further evaluation of the pancreas with contrast-enhanced pancreatic protocol CT or MRI. No pancreatic ductal dilatation. No loculated fluid collections. Spleen: Normal in size without focal abnormality. Adrenals/Urinary Tract: Adrenal glands are unremarkable. Kidneys are normal, without renal calculi, focal lesion, or hydronephrosis. Bladder is unremarkable. Stomach/Bowel: Stomach, small bowel, and colon are not abnormally distended. No wall thickening or inflammatory changes appreciated. Appendix is not identified. Vascular/Lymphatic: No significant vascular findings are present. No enlarged abdominal or pelvic lymph nodes. Reproductive: Uterus and bilateral adnexa are unremarkable. Other: No free air or free fluid in the abdomen. Abdominal wall musculature appears intact. Musculoskeletal: Anterior compression of the L1 vertebral body, age indeterminate. IMPRESSION: There is infiltration around the pancreas with angular scarring and retraction of adjacent structures. Mild prominence  of regional lymph nodes. Changes could be due to acute pancreatitis but appearance is worrisome for underlying neoplasm. Recommend further evaluation of the pancreas with contrast-enhanced pancreatic protocol CT or MRI. Anterior compression of the L1 vertebra, age indeterminate. Electronically Signed   By: Lucienne Capers M.D.   On: 01/21/2017 04:19     Scheduled Meds: . enoxaparin (LOVENOX) injection  40 mg Subcutaneous Q24H   Continuous Infusions: . sodium chloride 125 mL/hr at 01/21/17 1151     LOS: 0 days    Time spent: 30 min    Janece Canterbury, MD [facility] Pager [phone]  If 7PM-7AM, please contact night-coverage [email or web address] Password [facility] 01/21/2017, 1:37 PM

## 2017-01-21 NOTE — H&P (Signed)
History and Physical    Paige Andrews OAC:[id number] DOB: 24-Jul-1956 DOA: 01/21/2017   PCP: No PCP Per Patient Chief Complaint:  Chief Complaint  Patient presents with  . Abdominal Pain    HPI: Paige Andrews is a 61 y.o. female with medical history significant of HTN and IBS who presents to the Emergency Department complaining of progressively worsening, constant epigastric pain x 2 weeks but worsened significantly a few days ago. Pt states she was eating fatty foods when the episode began, and reports that she has eliminated gluten from her diet with no relief of symptoms. She notes associated back pain, nausea, vomiting, generalized body aches and constipation. Per pt, she has taken Ducalax with no relief. No alleviating or modifying factors noted.No SHx of cholecystectomy. She denies any fever, chills, or any other associated symptoms at this time.   ED Course: CT scan is concerning for lesion of pancreas that may or may not be neoplasm (on further discussion with radiologist, they are quite concerned about this actually).  Mild transaminitis.  Review of Systems: As per HPI otherwise 10 point review of systems negative.    Past Medical History:  Diagnosis Date  . Anxiety   . Bipolar 1 disorder ([place])   . Depression   . Hypertension     Past Surgical History:  Procedure Laterality Date  . carpel tunnel Right   . CESAREAN SECTION    . SHOULDER SURGERY Right      reports that she has never smoked. She does not have any smokeless tobacco history on file. She reports that she drinks alcohol. Her drug history is not on file.  No Known Allergies  No family history on file. No recent sick contacts   Prior to Admission medications   Medication Sig Start Date End Date Taking? Authorizing Provider  nortriptyline (PAMELOR) 25 MG capsule Take 1 capsule (25 mg total) by mouth at bedtime. Patient not taking: Reported on 01/21/2017 10/07/15   Dene Gentry, MD  permethrin (ACTICIN) 5 %  cream Apply 1 application topically once. Patient not taking: Reported on 01/21/2017 09/13/15   Dene Gentry, MD    Physical Exam: Vitals:   01/20/17 2351 01/21/17 0057 01/21/17 0321 01/21/17 0509  BP: (!) 144/101 133/90 131/84 151/94  Pulse: 111 101 102 93  Resp:  16 18 16   Temp:    98.3 F (36.8 C)  TempSrc:    Oral  SpO2:  96% 95% 98%      Constitutional: NAD, calm, comfortable Eyes: PERRL, lids and conjunctivae normal ENMT: Mucous membranes are moist. Posterior pharynx clear of any exudate or lesions.Normal dentition.  Neck: normal, supple, no masses, no thyromegaly Respiratory: clear to auscultation bilaterally, no wheezing, no crackles. Normal respiratory effort. No accessory muscle use.  Cardiovascular: Regular rate and rhythm, no murmurs / rubs / gallops. No extremity edema. 2+ pedal pulses. No carotid bruits.  Abdomen: no tenderness, no masses palpated. No hepatosplenomegaly. Bowel sounds positive.  Musculoskeletal: no clubbing / cyanosis. No joint deformity upper and lower extremities. Good ROM, no contractures. Normal muscle tone.  Skin: no rashes, lesions, ulcers. No induration Neurologic: CN 2-12 grossly intact. Sensation intact, DTR normal. Strength 5/5 in all 4.  Psychiatric: Normal judgment and insight. Alert and oriented x 3. Normal mood.    Labs on Admission: I have personally reviewed following labs and imaging studies  CBC:  Recent Labs Lab 01/20/17 2324  WBC 10.3  HGB 15.4*  HCT 42.3  MCV 94.8  PLT  102   Basic Metabolic Panel:  Recent Labs Lab 01/20/17 2324  NA 131*  K 3.4*  CL 101  CO2 18*  GLUCOSE 181*  BUN 9  CREATININE 0.79  CALCIUM 9.5   GFR: CrCl cannot be calculated (Unknown ideal weight.). Liver Function Tests:  Recent Labs Lab 01/20/17 2324  AST 80*  ALT 119*  ALKPHOS 67  BILITOT 0.9  PROT 7.7  ALBUMIN 4.2    Recent Labs Lab 01/20/17 2324  LIPASE 19   No results for input(s): AMMONIA in the last 168  hours. Coagulation Profile: No results for input(s): INR, PROTIME in the last 168 hours. Cardiac Enzymes: No results for input(s): CKTOTAL, CKMB, CKMBINDEX, TROPONINI in the last 168 hours. BNP (last 3 results) No results for input(s): PROBNP in the last 8760 hours. HbA1C: No results for input(s): HGBA1C in the last 72 hours. CBG: No results for input(s): GLUCAP in the last 168 hours. Lipid Profile: No results for input(s): CHOL, HDL, LDLCALC, TRIG, CHOLHDL, LDLDIRECT in the last 72 hours. Thyroid Function Tests: No results for input(s): TSH, T4TOTAL, FREET4, T3FREE, THYROIDAB in the last 72 hours. Anemia Panel: No results for input(s): VITAMINB12, FOLATE, FERRITIN, TIBC, IRON, RETICCTPCT in the last 72 hours. Urine analysis:    Component Value Date/Time   COLORURINE AMBER (A) 01/21/2017 0049   APPEARANCEUR HAZY (A) 01/21/2017 0049   LABSPEC 1.025 01/21/2017 0049   PHURINE 5.0 01/21/2017 0049   GLUCOSEU 150 (A) 01/21/2017 0049   HGBUR NEGATIVE 01/21/2017 0049   BILIRUBINUR NEGATIVE 01/21/2017 0049   KETONESUR 5 (A) 01/21/2017 0049   PROTEINUR 30 (A) 01/21/2017 0049   NITRITE NEGATIVE 01/21/2017 0049   LEUKOCYTESUR NEGATIVE 01/21/2017 0049   Sepsis Labs: @LABRCNTIP (procalcitonin:4,lacticidven:4) )No results found for this or any previous visit (from the past 240 hour(s)).   Radiological Exams on Admission: Dg Chest 2 View  Result Date: 01/20/2017 CLINICAL DATA:  Cough, shortness of breath, chest pain EXAM: CHEST  2 VIEW COMPARISON:  None. FINDINGS: Lungs are clear.  No pleural effusion or pneumothorax. The heart is normal in size. Mild superior endplate compression fracture deformity at L2, age indeterminate. IMPRESSION: No evidence of acute cardiopulmonary disease. Mild superior endplate compression fracture deformity at L2, age indeterminate. Electronically Signed   By: Julian Hy M.D.   On: 01/20/2017 23:50   US Abdomen Complete  Result Date: 01/21/2017 CLINICAL  DATA:  61 y/o F; chest pain and abdominal pain with nausea and vomiting. EXAM: ABDOMEN ULTRASOUND COMPLETE COMPARISON:  None. FINDINGS: Gallbladder: No gallstones or wall thickening visualized. No sonographic Murphy sign noted by sonographer. Common bile duct: Diameter: 3.1 mm Liver: Diffusely increased echogenicity.  No focal lesion. IVC: No abnormality visualized. Pancreas: Largely obscured by bowel gas. Spleen: Size and appearance within normal limits. Right Kidney: Length: 11.2 cm. Echogenicity within normal limits. No mass or hydronephrosis visualized. Left Kidney: Length: 12.4 cm. Echogenicity within normal limits. No mass or hydronephrosis visualized. Abdominal aorta: Obscured by bowel gas. Visualized segment measures 2.6 cm, within normal limits. Other findings: None. IMPRESSION: 1. No acute process identified. 2. Hepatic steatosis. Electronically Signed   By: Kristine Garbe M.D.   On: 01/21/2017 02:08   Ct Renal Stone Study  Result Date: 01/21/2017 CLINICAL DATA:  Constant in worsening epigastric pain for 2 weeks. Back pain, nausea, vomiting, generalized body aches, and constipation. EXAM: CT ABDOMEN AND PELVIS WITHOUT CONTRAST TECHNIQUE: Multidetector CT imaging of the abdomen and pelvis was performed following the standard protocol without IV contrast. COMPARISON:  Ultrasound 01/21/2017 FINDINGS: Lower chest: Lung bases are clear. Hepatobiliary: No focal liver abnormality is seen. No gallstones, gallbladder wall thickening, or biliary dilatation. Pancreas: Linear infiltration around the pancreas with scarring and vascular retraction centered at the junction of the body and tail. Mildly enlarged celiac axis and peripancreatic lymph nodes. This may represent acute pancreatitis but an infiltrating mass lesion is not excluded. Consider further evaluation of the pancreas with contrast-enhanced pancreatic protocol CT or MRI. No pancreatic ductal dilatation. No loculated fluid collections. Spleen:  Normal in size without focal abnormality. Adrenals/Urinary Tract: Adrenal glands are unremarkable. Kidneys are normal, without renal calculi, focal lesion, or hydronephrosis. Bladder is unremarkable. Stomach/Bowel: Stomach, small bowel, and colon are not abnormally distended. No wall thickening or inflammatory changes appreciated. Appendix is not identified. Vascular/Lymphatic: No significant vascular findings are present. No enlarged abdominal or pelvic lymph nodes. Reproductive: Uterus and bilateral adnexa are unremarkable. Other: No free air or free fluid in the abdomen. Abdominal wall musculature appears intact. Musculoskeletal: Anterior compression of the L1 vertebral body, age indeterminate. IMPRESSION: There is infiltration around the pancreas with angular scarring and retraction of adjacent structures. Mild prominence of regional lymph nodes. Changes could be due to acute pancreatitis but appearance is worrisome for underlying neoplasm. Recommend further evaluation of the pancreas with contrast-enhanced pancreatic protocol CT or MRI. Anterior compression of the L1 vertebra, age indeterminate. Electronically Signed   By: Lucienne Capers M.D.   On: 01/21/2017 04:19    EKG: Independently reviewed.  Assessment/Plan Principal Problem:   Pancreatic abnormality Active Problems:   Nausea & vomiting   Transaminitis    1. Pancreatic abnormality on CT - 1. Getting MRI abdomen with and without contrast to further evaluate 2. N/V -  1. Zofran 2. IVF 3. Transaminitis - mild at this point 1. Repeat CMP tomorrow AM   DVT prophylaxis: Lovenox Code Status: Full Family Communication: No family in room Consults called: None Admission status: Admit to inpatient   Etta Quill DO [facility] Pager [phone] from 7PM-7AM  If 7AM-7PM, please contact the day physician for the patient [email or web address] Password [facility]  01/21/2017, 6:46 AM

## 2017-01-21 NOTE — ED Notes (Signed)
Pt with Ultrasound °

## 2017-01-21 NOTE — ED Provider Notes (Signed)
[facility]-EMERGENCY DEPT Provider Note   CSN: [id number] Arrival date & time: 01/20/17  2315 By signing my name below, I, Paige Andrews, attest that this documentation has been prepared under the direction and in the presence of Avan Gullett, MD . Electronically Signed: Levon HedgerElizabeth Andrews, Scribe. 01/21/2017. 2:48 AM.   History   Chief Complaint Chief Complaint  Patient presents with  . Abdominal Pain   HPI Victory DakinKerry Andrews is a 61 y.o. female with a Hx of HTN and IBS who presents to the Emergency Department complaining of progressively worsening, constant epigastric pain x 2 weeks but worsened significantly a few days ago. Pt states she was eating fatty foods when the episode began, and reports that she has eliminated gluten from her diet with no relief of symptoms. She notes associated back pain, nausea, vomiting, generalized body aches and constipation. Per pt, she has taken Ducalax with no relief. No alleviating or modifying factors noted.No SHx of cholecystectomy. She denies any fever, chills, or any other associated symptoms at this time.   The history is provided by the patient. No language interpreter was used.  Abdominal Pain   This is a recurrent problem. The current episode started more than 1 week ago. The problem occurs constantly. The problem has been gradually worsening. The pain is associated with eating. The pain is located in the epigastric region, RUQ and LLQ. Associated symptoms include nausea, vomiting and constipation. Pertinent negatives include fever. Nothing relieves the symptoms. Past workup does not include GI consult. Her past medical history is significant for irritable bowel syndrome.   Past Medical History:  Diagnosis Date  . Anxiety   . Bipolar 1 disorder (HCC)   . Depression   . Hypertension     Patient Active Problem List   Diagnosis Date Noted  . Injury of right hand 09/14/2015  . Rash 09/14/2015    Past Surgical History:  Procedure Laterality Date  .  carpel tunnel Right   . CESAREAN SECTION    . SHOULDER SURGERY Right     OB History    No data available      Home Medications    Prior to Admission medications   Medication Sig Start Date End Date Taking? Authorizing Provider  alprazolam Prudy Feeler(XANAX) 2 MG tablet Take 2 mg by mouth.    Historical Provider, MD  baclofen (LIORESAL) 10 MG tablet Take 10 mg by mouth.    Historical Provider, MD  gabapentin (NEURONTIN) 600 MG tablet Take by mouth.    Historical Provider, MD  nortriptyline (PAMELOR) 25 MG capsule Take 1 capsule (25 mg total) by mouth at bedtime. 10/07/15   Lenda KelpShane R Hudnall, MD  permethrin (ACTICIN) 5 % cream Apply 1 application topically once. 09/13/15   Lenda KelpShane R Hudnall, MD    Family History No family history on file.  Social History Social History  Substance Use Topics  . Smoking status: Never Smoker  . Smokeless tobacco: Not on file  . Alcohol use 0.0 oz/week     Comment: drinks occasionally    Allergies   Patient has no known allergies.  Review of Systems Review of Systems  Constitutional: Negative for chills and fever.  Gastrointestinal: Positive for abdominal pain, constipation, nausea and vomiting.  All other systems reviewed and are negative.  Physical Exam Updated Vital Signs BP 131/84 (BP Location: Right Arm)   Pulse 102   Temp 98 F (36.7 C) (Oral)   Resp 18   SpO2 95%   Physical Exam  Constitutional: She  is oriented to person, place, and time. She appears well-developed and well-nourished. No distress.  HENT:  Head: Normocephalic and atraumatic.  Mouth/Throat: Oropharynx is clear and moist. No oropharyngeal exudate.  Moist mucous membranes   Eyes: Conjunctivae are normal. Pupils are equal, round, and reactive to light.  Neck: No JVD present.  Trachea midline No bruit  Cardiovascular: Normal rate, regular rhythm, normal heart sounds and intact distal pulses.   Pulmonary/Chest: Effort normal and breath sounds normal. No stridor. No respiratory  distress. She has no wheezes. She has no rales.  Abdominal: Soft. Bowel sounds are normal. She exhibits distension. There is no rebound, no guarding, no tenderness at McBurney's point and negative Murphy's sign.  Musculoskeletal: Normal range of motion.  Neurological: She is alert and oriented to person, place, and time. She has normal reflexes. She displays normal reflexes.  Skin: Skin is warm and dry. Capillary refill takes less than 2 seconds.  Psychiatric: She has a normal mood and affect. Her behavior is normal.  Nursing note and vitals reviewed.  ED Treatments / Results   Vitals:   01/21/17 0057 01/21/17 0321  BP: 133/90 131/84  Pulse: 101 102  Resp: 16 18  Temp:      DIAGNOSTIC STUDIES:  Oxygen Saturation is 98% on RA, normal by my interpretation.    COORDINATION OF CARE:  1:01 AM Discussed treatment plan with pt at bedside and pt agreed to plan.  Labs (all labs ordered are listed, but only abnormal results are displayed) Labs Reviewed  COMPREHENSIVE METABOLIC PANEL - Abnormal; Notable for the following:       Result Value   Sodium 131 (*)    Potassium 3.4 (*)    CO2 18 (*)    Glucose, Bld 181 (*)    AST 80 (*)    ALT 119 (*)    All other components within normal limits  CBC - Abnormal; Notable for the following:    Hemoglobin 15.4 (*)    MCH 34.5 (*)    MCHC 36.4 (*)    All other components within normal limits  URINALYSIS, ROUTINE W REFLEX MICROSCOPIC - Abnormal; Notable for the following:    Color, Urine AMBER (*)    APPearance HAZY (*)    Glucose, UA 150 (*)    Ketones, ur 5 (*)    Protein, ur 30 (*)    Bacteria, UA RARE (*)    Squamous Epithelial / LPF 6-30 (*)    All other components within normal limits  LIPASE, BLOOD  I-STAT TROPOININ, ED    EKG  EKG Interpretation  Date/Time:  Saturday January 20 2017 23:21:38 EST Ventricular Rate:  115 PR Interval:    QRS Duration: 98 QT Interval:  320 QTC Calculation: 443 R Axis:   -16 Text  Interpretation:  Sinus tachycardia Borderline left axis deviation Low voltage, extremity and precordial leads Consider anterior infarct No old tracing to compare Confirmed by BELFI  MD, MELANIE ([id number]) on 01/20/2017 11:54:56 PM       Radiology Dg Chest 2 View  Result Date: 01/20/2017 CLINICAL DATA:  Cough, shortness of breath, chest pain EXAM: CHEST  2 VIEW COMPARISON:  None. FINDINGS: Lungs are clear.  No pleural effusion or pneumothorax. The heart is normal in size. Mild superior endplate compression fracture deformity at L2, age indeterminate. IMPRESSION: No evidence of acute cardiopulmonary disease. Mild superior endplate compression fracture deformity at L2, age indeterminate. Electronically Signed   By: Charline Bills M.D.   On: 01/20/2017  23:50   US Abdomen Complete  Result Date: 01/21/2017 CLINICAL DATA:  61 y/o F; chest pain and abdominal pain with nausea and vomiting. EXAM: ABDOMEN ULTRASOUND COMPLETE COMPARISON:  None. FINDINGS: Gallbladder: No gallstones or wall thickening visualized. No sonographic Murphy sign noted by sonographer. Common bile duct: Diameter: 3.1 mm Liver: Diffusely increased echogenicity.  No focal lesion. IVC: No abnormality visualized. Pancreas: Largely obscured by bowel gas. Spleen: Size and appearance within normal limits. Right Kidney: Length: 11.2 cm. Echogenicity within normal limits. No mass or hydronephrosis visualized. Left Kidney: Length: 12.4 cm. Echogenicity within normal limits. No mass or hydronephrosis visualized. Abdominal aorta: Obscured by bowel gas. Visualized segment measures 2.6 cm, within normal limits. Other findings: None. IMPRESSION: 1. No acute process identified. 2. Hepatic steatosis. Electronically Signed   By: Mitzi Hansen M.D.   On: 01/21/2017 02:08    Procedures Procedures (including critical care time)  Medications Ordered in ED Medications  ondansetron (ZOFRAN) injection 4 mg (4 mg Intravenous Given 01/21/17 0118)    sodium chloride 0.9 % bolus 500 mL (0 mLs Intravenous Stopped 01/21/17 0209)  fentaNYL (SUBLIMAZE) injection 50 mcg (50 mcg Intravenous Given 01/21/17 0118)      Final Clinical Impressions(s) / ED Diagnoses  Pancreatic lesion: will need admission for work up to exclude pancreatic CA.   New Prescriptions  I personally performed the services described in this documentation, which was scribed in my presence. The recorded information has been reviewed and is accurate.      Cy Blamer, MD 01/21/17 [phone]

## 2017-01-22 LAB — COMPREHENSIVE METABOLIC PANEL
ALT: 93 U/L — AB (ref 14–54)
AST: 64 U/L — ABNORMAL HIGH (ref 15–41)
Albumin: 4 g/dL (ref 3.5–5.0)
Alkaline Phosphatase: 58 U/L (ref 38–126)
Anion gap: 5 (ref 5–15)
BILIRUBIN TOTAL: 1.1 mg/dL (ref 0.3–1.2)
BUN: <5 mg/dL — ABNORMAL LOW (ref 6–20)
CHLORIDE: 107 mmol/L (ref 101–111)
CO2: 25 mmol/L (ref 22–32)
CREATININE: 0.76 mg/dL (ref 0.44–1.00)
Calcium: 8.7 mg/dL — ABNORMAL LOW (ref 8.9–10.3)
Glucose, Bld: 154 mg/dL — ABNORMAL HIGH (ref 65–99)
Potassium: 3.7 mmol/L (ref 3.5–5.1)
Sodium: 137 mmol/L (ref 135–145)
Total Protein: 6.9 g/dL (ref 6.5–8.1)

## 2017-01-22 LAB — TROPONIN I

## 2017-01-22 MED ORDER — PANTOPRAZOLE SODIUM 40 MG PO TBEC
40.0000 mg | DELAYED_RELEASE_TABLET | Freq: Two times a day (BID) | ORAL | Status: DC
  Administered 2017-01-22 – 2017-01-23 (×3): 40 mg via ORAL
  Filled 2017-01-22 (×3): qty 1

## 2017-01-22 MED ORDER — HYDROXYZINE HCL 25 MG PO TABS
25.0000 mg | ORAL_TABLET | Freq: Three times a day (TID) | ORAL | Status: DC | PRN
  Administered 2017-01-22 – 2017-01-23 (×3): 25 mg via ORAL
  Filled 2017-01-22 (×3): qty 1

## 2017-01-22 MED ORDER — GI COCKTAIL ~~LOC~~
30.0000 mL | Freq: Once | ORAL | Status: AC
  Administered 2017-01-22: 30 mL via ORAL
  Filled 2017-01-22: qty 30

## 2017-01-22 MED ORDER — DIPHENHYDRAMINE HCL 25 MG PO CAPS
25.0000 mg | ORAL_CAPSULE | ORAL | Status: DC | PRN
  Administered 2017-01-22 (×3): 25 mg via ORAL
  Filled 2017-01-22 (×3): qty 1

## 2017-01-22 MED ORDER — IBUPROFEN 200 MG PO TABS
200.0000 mg | ORAL_TABLET | Freq: Once | ORAL | Status: AC
  Administered 2017-01-22: 200 mg via ORAL
  Filled 2017-01-22: qty 1

## 2017-01-22 MED ORDER — ACETAMINOPHEN 325 MG PO TABS
650.0000 mg | ORAL_TABLET | Freq: Four times a day (QID) | ORAL | Status: DC | PRN
  Administered 2017-01-22: 650 mg via ORAL
  Filled 2017-01-22: qty 2

## 2017-01-22 MED ORDER — HYDROXYZINE HCL 25 MG PO TABS
25.0000 mg | ORAL_TABLET | Freq: Once | ORAL | Status: AC
  Administered 2017-01-22: 25 mg via ORAL
  Filled 2017-01-22: qty 1

## 2017-01-22 NOTE — Progress Notes (Signed)
PROGRESS NOTE  Paige Andrews  ASN:[id number] DOB: 22-Jun-1956 DOA: 01/21/2017 PCP: No PCP Per Patient  Brief Narrative:   Paige Andrews is a 61 y.o. female with medical history significant of HTN, pancreatitis, and IBS who presents to the Emergency Department complaining of progressively worsening, constant epigastric pain x 2 weeks but worsened significantly a few days prior to admission.  She has had associated back pain, nausea, vomiting.  She has had a 5-6-lb weight loss over the last month.  CT demonstrated a concerning pancreatic mass.  MRI shows an area of focal scarring/atrophy in the mid/distal pancreatic tail with adherence to adjacent structures including the stomach. She has had previous pancreatitis which could explain scar, however, pancreatic mass was not excluded.  GI consulted for possible EUS.    Assessment & Plan:   Principal Problem:   Pancreatic abnormality Active Problems:   Nausea & vomiting   Transaminitis  Nausea, vomiting and abdominal pain in setting of pancreatic mass/scarring.  LFTs mildly elevated but bilirubin and lipase are normal.  Symptoms may be related to IBS, recent virus, constipation.   -  Continue IVF -  Schedule zofran and add prn phenergan -  GI cocktail once -  Start BID PPI -  Advance to soft diet -  Encouraged small frequent amounts of fluids -  Transition to oral pain medication -  [facility] consult pending -  CEA -  CA-19-9  Possible chest discomfort, atypical.  Initial troponin negative but will check one more -  ECG:  NSR, no evidence of acute ischemia -  Check troponin x 1:  negative  Constipation, resolved with enema yesterday -  Continue miralax, colace  Hyperglycemia -  A1c pending  Hyponatremia with metabolic acidosis, likely due to dehydration, resolved with IVF  Hypokalemia, resolved with potassium in IVF  DVT prophylaxis:  lovenox Code Status:  DNR Family Communication:  Patient alone Disposition Plan:  Pending  tolerate diet/fluids and oral medications.  SW and CM consults placed as patient does not have health insurance and will not be able to afford medications, appointments, or procedures.     Consultants:   [facility]   Procedures:  none  Antimicrobials:  Anti-infectives    None       Subjective: Ongoing nausea, but vomiting has improved.  Had a large BM yesterday after her enema and some of her abdominal and back pain has resolved.  Some pain persists, however.  Would like to try some solid foods today. Having some pressure in the substernal/epigastric area that radiates under her right breast without associated SOB.  Her nausea has not worsened since pain started.      Objective: Vitals:   01/21/17 1411 01/21/17 2106 01/21/17 2133 01/22/17 0504  BP: (!) 126/92 (!) 164/106 (!) 160/100 119/90  Pulse: 95 (!) 115 90 87  Resp: 18 20 20 18   Temp: 97.6 F (36.4 C) 98 F (36.7 C)  98.6 F (37 C)  TempSrc: Oral Oral  Oral  SpO2: 99% 98% 100% 100%  Weight:      Height:        Intake/Output Summary (Last 24 hours) at 01/22/17 1103 Last data filed at 01/22/17 0700  Gross per 24 hour  Intake          2465.33 ml  Output             2050 ml  Net           415.33 ml   Danley Danker  Weights   01/21/17 0730  Weight: 87.7 kg (193 lb 4.8 oz)    Examination:  General exam:  Adult female, lying in bed.  No acute distress.  HEENT:  NCAT, MMM Respiratory system: Clear to auscultation bilaterally Cardiovascular system: Regular rate and rhythm, normal S1/S2. No murmurs, rubs, gallops or clicks.  Warm extremities Gastrointestinal system: Normal active bowel sounds, soft, nondistended, TTP in epigastric area.  No rebound or guarding.   MSK:  Normal tone and bulk, no lower extremity edema Neuro:  Grossly intact    Data Reviewed: I have personally reviewed following labs and imaging studies  CBC:  Recent Labs Lab 01/20/17 2324  WBC 10.3  HGB 15.4*  HCT 42.3  MCV 94.8  PLT 979   Basic  Metabolic Panel:  Recent Labs Lab 01/20/17 2324 01/22/17 0423  NA 131* 137  K 3.4* 3.7  CL 101 107  CO2 18* 25  GLUCOSE 181* 154*  BUN 9 <5*  CREATININE 0.79 0.76  CALCIUM 9.5 8.7*   GFR: Estimated Creatinine Clearance: 80.2 mL/min (by C-G formula based on SCr of 0.76 mg/dL). Liver Function Tests:  Recent Labs Lab 01/20/17 2324 01/22/17 0423  AST 80* 64*  ALT 119* 93*  ALKPHOS 67 58  BILITOT 0.9 1.1  PROT 7.7 6.9  ALBUMIN 4.2 4.0    Recent Labs Lab 01/20/17 2324  LIPASE 19   No results for input(s): AMMONIA in the last 168 hours. Coagulation Profile: No results for input(s): INR, PROTIME in the last 168 hours. Cardiac Enzymes:  Recent Labs Lab 01/22/17 0908  TROPONINI <0.03   BNP (last 3 results) No results for input(s): PROBNP in the last 8760 hours. HbA1C: No results for input(s): HGBA1C in the last 72 hours. CBG: No results for input(s): GLUCAP in the last 168 hours. Lipid Profile: No results for input(s): CHOL, HDL, LDLCALC, TRIG, CHOLHDL, LDLDIRECT in the last 72 hours. Thyroid Function Tests: No results for input(s): TSH, T4TOTAL, FREET4, T3FREE, THYROIDAB in the last 72 hours. Anemia Panel: No results for input(s): VITAMINB12, FOLATE, FERRITIN, TIBC, IRON, RETICCTPCT in the last 72 hours. Urine analysis:    Component Value Date/Time   COLORURINE AMBER (A) 01/21/2017 0049   APPEARANCEUR HAZY (A) 01/21/2017 0049   LABSPEC 1.025 01/21/2017 0049   PHURINE 5.0 01/21/2017 0049   GLUCOSEU 150 (A) 01/21/2017 0049   HGBUR NEGATIVE 01/21/2017 0049   BILIRUBINUR NEGATIVE 01/21/2017 0049   KETONESUR 5 (A) 01/21/2017 0049   PROTEINUR 30 (A) 01/21/2017 0049   NITRITE NEGATIVE 01/21/2017 0049   LEUKOCYTESUR NEGATIVE 01/21/2017 0049   Sepsis Labs: @LABRCNTIP (procalcitonin:4,lacticidven:4)  )No results found for this or any previous visit (from the past 240 hour(s)).    Radiology Studies: Dg Chest 2 View  Result Date: 01/20/2017 CLINICAL DATA:   Cough, shortness of breath, chest pain EXAM: CHEST  2 VIEW COMPARISON:  None. FINDINGS: Lungs are clear.  No pleural effusion or pneumothorax. The heart is normal in size. Mild superior endplate compression fracture deformity at L2, age indeterminate. IMPRESSION: No evidence of acute cardiopulmonary disease. Mild superior endplate compression fracture deformity at L2, age indeterminate. Electronically Signed   By: Julian Hy M.D.   On: 01/20/2017 23:50   Mr Abdomen W Wo Contrast  Result Date: 01/21/2017 CLINICAL DATA:  Worsening abdominal pain x2 weeks, nausea/vomiting, back pain. Abnormal pancreas on CT, evaluate for underlying pancreatic neoplasm. EXAM: MRI ABDOMEN WITHOUT AND WITH CONTRAST TECHNIQUE: Multiplanar multisequence MR imaging of the abdomen was performed both before and  after the administration of intravenous contrast. CONTRAST:  75m MULTIHANCE GADOBENATE DIMEGLUMINE 529 MG/ML IV SOLN COMPARISON:  Unenhanced CT abdomen/ pelvis dated 01/21/2017 FINDINGS: Lower chest: Lung bases are clear. Hepatobiliary: Liver is within normal limits. No suspicious/enhancing hepatic lesions. Gallbladder is unremarkable. No intrahepatic or extrahepatic ductal dilatation Pancreas: Pancreatic head/ uncinate process and proximal pancreatic body are within normal limits. However, there is abrupt cut off of the pancreatic tail with surrounding scarring and adherence of adjacent structures, including the stomach (series 1203/ image 48). It is unclear whether this reflects an underlying hypoenhancing mass with fibrosis or is simply the sequela of prior inflammation/ pancreatitis. No superimposed acute inflammation is evident. No peripancreatic fluid/ pseudocyst. No associated pancreatic ductal dilatation. Spleen:  Within normal limits. Adrenals/Urinary Tract:  Adrenal glands are within normal limits. Kidneys are within normal limits.  No hydronephrosis. Stomach/Bowel: Mild angulation of the mid gastric body which is  adherent to the pancreatic tail. Stomach is otherwise within normal limits. Visualized bowel is unremarkable. Vascular/Lymphatic:  No evidence of abdominal aortic aneurysm. No suspicious abdominal lymphadenopathy. Small peripancreatic lymph nodes measuring up to 7 mm Faiga Stones axis in the left upper abdomen (Series 5/ image 21). Other:  No abdominal ascites. Musculoskeletal: No focal osseous lesions. IMPRESSION: Focal scarring/ atrophy involving the mid/ distal pancreatic tail, with adherence of adjacent structures, including the stomach. While this may reflect sequela of prior pancreatitis in the appropriate clinical setting, underlying pancreatic mass is not excluded. EUS is suggested for further evaluation. Adjacent small peripancreatic lymph nodes measuring up to 7 mm Ewel Lona axis, nonspecific. Electronically Signed   By: SJulian HyM.D.   On: 01/21/2017 12:41   UKoreaAbdomen Complete  Result Date: 01/21/2017 CLINICAL DATA:  61y/o F; chest pain and abdominal pain with nausea and vomiting. EXAM: ABDOMEN ULTRASOUND COMPLETE COMPARISON:  None. FINDINGS: Gallbladder: No gallstones or wall thickening visualized. No sonographic Murphy sign noted by sonographer. Common bile duct: Diameter: 3.1 mm Liver: Diffusely increased echogenicity.  No focal lesion. IVC: No abnormality visualized. Pancreas: Largely obscured by bowel gas. Spleen: Size and appearance within normal limits. Right Kidney: Length: 11.2 cm. Echogenicity within normal limits. No mass or hydronephrosis visualized. Left Kidney: Length: 12.4 cm. Echogenicity within normal limits. No mass or hydronephrosis visualized. Abdominal aorta: Obscured by bowel gas. Visualized segment measures 2.6 cm, within normal limits. Other findings: None. IMPRESSION: 1. No acute process identified. 2. Hepatic steatosis. Electronically Signed   By: LKristine GarbeM.D.   On: 01/21/2017 02:08   Ct Renal Stone Study  Result Date: 01/21/2017 CLINICAL DATA:  Constant  in worsening epigastric pain for 2 weeks. Back pain, nausea, vomiting, generalized body aches, and constipation. EXAM: CT ABDOMEN AND PELVIS WITHOUT CONTRAST TECHNIQUE: Multidetector CT imaging of the abdomen and pelvis was performed following the standard protocol without IV contrast. COMPARISON:  Ultrasound 01/21/2017 FINDINGS: Lower chest: Lung bases are clear. Hepatobiliary: No focal liver abnormality is seen. No gallstones, gallbladder wall thickening, or biliary dilatation. Pancreas: Linear infiltration around the pancreas with scarring and vascular retraction centered at the junction of the body and tail. Mildly enlarged celiac axis and peripancreatic lymph nodes. This may represent acute pancreatitis but an infiltrating mass lesion is not excluded. Consider further evaluation of the pancreas with contrast-enhanced pancreatic protocol CT or MRI. No pancreatic ductal dilatation. No loculated fluid collections. Spleen: Normal in size without focal abnormality. Adrenals/Urinary Tract: Adrenal glands are unremarkable. Kidneys are normal, without renal calculi, focal lesion, or hydronephrosis. Bladder is unremarkable. Stomach/Bowel:  Stomach, small bowel, and colon are not abnormally distended. No wall thickening or inflammatory changes appreciated. Appendix is not identified. Vascular/Lymphatic: No significant vascular findings are present. No enlarged abdominal or pelvic lymph nodes. Reproductive: Uterus and bilateral adnexa are unremarkable. Other: No free air or free fluid in the abdomen. Abdominal wall musculature appears intact. Musculoskeletal: Anterior compression of the L1 vertebral body, age indeterminate. IMPRESSION: There is infiltration around the pancreas with angular scarring and retraction of adjacent structures. Mild prominence of regional lymph nodes. Changes could be due to acute pancreatitis but appearance is worrisome for underlying neoplasm. Recommend further evaluation of the pancreas with  contrast-enhanced pancreatic protocol CT or MRI. Anterior compression of the L1 vertebra, age indeterminate. Electronically Signed   By: Lucienne Capers M.D.   On: 01/21/2017 04:19     Scheduled Meds: . docusate sodium  100 mg Oral BID  . enoxaparin (LOVENOX) injection  40 mg Subcutaneous Q24H  . ondansetron (ZOFRAN) IV  8 mg Intravenous Q8H  . pantoprazole  40 mg Oral BID  . polyethylene glycol  17 g Oral BID   Continuous Infusions: . dextrose 5 % and 0.45 % NaCl with KCl 10 mEq/L 75 mL/hr at 01/22/17 0857     LOS: 1 day    Time spent: 30 min    Janece Canterbury, MD [facility] Pager [phone]  If 7PM-7AM, please contact night-coverage [email or web address] Password [facility] 01/22/2017, 11:03 AM

## 2017-01-22 NOTE — Progress Notes (Signed)
CSW consulted due to pt lacking health insurance and needing assistance with medications. CSW is unable to assist with this request. RNCM has been consulted and may be able to assist with request.   CSW signing off.  Cori RazorJamie Joon Pohle LCSW [phone]

## 2017-01-22 NOTE — Care Management Note (Addendum)
Case Management Note  Patient Details  Name: Paige DakinKerry Borkenhagen MRN: [id number] Date of Birth: 01/10/1956  Subjective/Objective:         61 yo admitted with Pancreatic abnormality.           Action/Plan: From home with significant other. [place] and does not have a PCP or insurance. This CM provided pt with information on [facility], [facility], and other PCP's in the area that take self pay or medicaid pts. Pt states she is unsure what prescriptions she will be discharged on. Pt states she will call [facility] to make a hospital follow up appointment. CM will continue to follow and assist with medications if qualifies.  Expected Discharge Date:  01/22/17               Expected Discharge Plan:  Home/Self Care  In-House Referral:     Discharge planning Services  CM Consult, [facility]  Post Acute Care Choice:    Choice offered to:     DME Arranged:    DME Agency:     HH Arranged:    HH Agency:     Status of Service:  In process, will continue to follow  If discussed at Long Length of Stay Meetings, dates discussed:    Additional CommentsBartholome Bill:  Jontavia Leatherbury H, RN 01/22/2017, 10:20 AM  [phone]

## 2017-01-22 NOTE — Consult Note (Signed)
[facility] CONSULT Reason for consult: abdominal pain and pancreatic mass Referring Physician: [facility] hospital[facility] no PCP  Paige Andrews is an 61 y.o. female.  HPI: she came to the emergency room after 2 to 3 weeks of progressively worsening epigastric pain. This occurred after eating fatty foods. She had back pain some nausea and vomiting no fever. It was felt that this could be biliary disease but ultrasound did not show any gallstones or ductal dilatation. CT scan showed some prominence of the pancreas consistent with mild pancreatitis and some enlarged lymph nodes leading to MRI which showed abrupt cutoff of the pancreatic duct with scarring and inflammation. There was not a clear-cut mass seen. There is felt to be scarring 2 adjacent structures. The question of neoplasm versus scarring from prior pancreatitis was consider. The patient notes that she used to be a very heavy drinker and started drinking when she was 78 to 61 years old continue throughout her youth. She quit drinking approximately 4 years ago. 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 She is not had any pancreatitis and at least 4 years.  Past Medical History:  Diagnosis Date  . Anxiety   . Bipolar 1 disorder ([place])   . Celiac disease   . Depression   . Hypertension   . IBS (irritable bowel syndrome)     Past Surgical History:  Procedure Laterality Date  . carpel tunnel Right   . CESAREAN SECTION    . SHOULDER SURGERY Right     Family History  Problem Relation Age of Onset  . Dementia Mother   . Breast cancer Mother   . Pancreatitis Father   . Dementia Father     Social History:  reports that she has never smoked. She has never used smokeless tobacco. She reports that she does not drink alcohol or use drugs.  Allergies:  Allergies  Allergen Reactions  . Morphine And Related Anxiety    Patient states, " it makes me  feel like I am crawling out of my  skin"    Medications; Prior to Admission medications   Medication Sig Start Date End Date Taking? Authorizing Provider  nortriptyline (PAMELOR) 25 MG capsule Take 1 capsule (25 mg total) by mouth at bedtime. Patient not taking: Reported on 01/21/2017 10/07/15   Dene Gentry, MD  permethrin (ACTICIN) 5 % cream Apply 1 application topically once. Patient not taking: Reported on 01/21/2017 09/13/15   Dene Gentry, MD   . docusate sodium  100 mg Oral BID  . enoxaparin (LOVENOX) injection  40 mg Subcutaneous Q24H  . ondansetron (ZOFRAN) IV  8 mg Intravenous Q8H  . pantoprazole  40 mg Oral BID  . polyethylene glycol  17 g Oral BID   PRN Meds diphenhydrAMINE, oxyCODONE, promethazine Results for orders placed or performed during the hospital encounter of 01/21/17 (from the past 48 hour(s))  Lipase, blood     Status: None   Collection Time: 01/20/17 11:24 PM  Result Value Ref Range   Lipase 19 11 - 51 U/L  Comprehensive metabolic panel     Status: Abnormal   Collection Time: 01/20/17 11:24 PM  Result Value Ref Range   Sodium 131 (L) 135 - 145 mmol/L   Potassium 3.4 (L) 3.5 - 5.1 mmol/L   Chloride 101 101 - 111 mmol/L   CO2 18 (L) 22 - 32 mmol/L   Glucose, Bld 181 (H) 65 - 99 mg/dL   BUN 9  6 - 20 mg/dL   Creatinine, Ser 0.79 0.44 - 1.00 mg/dL   Calcium 9.5 8.9 - 10.3 mg/dL   Total Protein 7.7 6.5 - 8.1 g/dL   Albumin 4.2 3.5 - 5.0 g/dL   AST 80 (H) 15 - 41 U/L   ALT 119 (H) 14 - 54 U/L   Alkaline Phosphatase 67 38 - 126 U/L   Total Bilirubin 0.9 0.3 - 1.2 mg/dL   GFR calc non Af Amer >60 >60 mL/min   GFR calc Af Amer >60 >60 mL/min    Comment: (NOTE) The eGFR has been calculated using the CKD EPI equation. This calculation has not been validated in all clinical situations. eGFR's persistently <60 mL/min signify possible Chronic Kidney Disease.    Anion gap 12 5 - 15  CBC     Status: Abnormal   Collection Time: 01/20/17 11:24 PM  Result Value Ref Range   WBC 10.3 4.0 -  10.5 K/uL   RBC 4.46 3.87 - 5.11 MIL/uL   Hemoglobin 15.4 (H) 12.0 - 15.0 g/dL   HCT 42.3 36.0 - 46.0 %   MCV 94.8 78.0 - 100.0 fL   MCH 34.5 (H) 26.0 - 34.0 pg   MCHC 36.4 (H) 30.0 - 36.0 g/dL   RDW 13.5 11.5 - 15.5 %   Platelets 174 150 - 400 K/uL  I-Stat Troponin, ED (not at [facility])     Status: None   Collection Time: 01/20/17 11:30 PM  Result Value Ref Range   Troponin i, poc 0.00 0.00 - 0.08 ng/mL   Comment 3            Comment: Due to the release kinetics of cTnI, a negative result within the first hours of the onset of symptoms does not rule out myocardial infarction with certainty. If myocardial infarction is still suspected, repeat the test at appropriate intervals.   Urinalysis, Routine w reflex microscopic     Status: Abnormal   Collection Time: 01/21/17 12:49 AM  Result Value Ref Range   Color, Urine AMBER (A) YELLOW    Comment: BIOCHEMICALS MAY BE AFFECTED BY COLOR   APPearance HAZY (A) CLEAR   Specific Gravity, Urine 1.025 1.005 - 1.030   pH 5.0 5.0 - 8.0   Glucose, UA 150 (A) NEGATIVE mg/dL   Hgb urine dipstick NEGATIVE NEGATIVE   Bilirubin Urine NEGATIVE NEGATIVE   Ketones, ur 5 (A) NEGATIVE mg/dL   Protein, ur 30 (A) NEGATIVE mg/dL   Nitrite NEGATIVE NEGATIVE   Leukocytes, UA NEGATIVE NEGATIVE   RBC / HPF 0-5 0 - 5 RBC/hpf   WBC, UA 0-5 0 - 5 WBC/hpf   Bacteria, UA RARE (A) NONE SEEN   Squamous Epithelial / LPF 6-30 (A) NONE SEEN   Mucous PRESENT   Influenza panel by PCR (type A & B)     Status: None   Collection Time: 01/21/17  8:28 AM  Result Value Ref Range   Influenza A By PCR NEGATIVE NEGATIVE   Influenza B By PCR NEGATIVE NEGATIVE    Comment: (NOTE) The Xpert Xpress Flu assay is intended as an aid in the diagnosis of  influenza and should not be used as a sole basis for treatment.  This  assay is FDA approved for nasopharyngeal swab specimens only. Nasal  washings and aspirates are unacceptable for Xpert Xpress Flu testing.   Comprehensive  metabolic panel     Status: Abnormal   Collection Time: 01/22/17  4:23 AM  Result Value Ref  Range   Sodium 137 135 - 145 mmol/L   Potassium 3.7 3.5 - 5.1 mmol/L   Chloride 107 101 - 111 mmol/L   CO2 25 22 - 32 mmol/L   Glucose, Bld 154 (H) 65 - 99 mg/dL   BUN <5 (L) 6 - 20 mg/dL   Creatinine, Ser 0.76 0.44 - 1.00 mg/dL   Calcium 8.7 (L) 8.9 - 10.3 mg/dL   Total Protein 6.9 6.5 - 8.1 g/dL   Albumin 4.0 3.5 - 5.0 g/dL   AST 64 (H) 15 - 41 U/L   ALT 93 (H) 14 - 54 U/L   Alkaline Phosphatase 58 38 - 126 U/L   Total Bilirubin 1.1 0.3 - 1.2 mg/dL   GFR calc non Af Amer >60 >60 mL/min   GFR calc Af Amer >60 >60 mL/min    Comment: (NOTE) The eGFR has been calculated using the CKD EPI equation. This calculation has not been validated in all clinical situations. eGFR's persistently <60 mL/min signify possible Chronic Kidney Disease.    Anion gap 5 5 - 15  Troponin I     Status: None   Collection Time: 01/22/17  9:08 AM  Result Value Ref Range   Troponin I <0.03 <0.03 ng/mL    Dg Chest 2 View  Result Date: 01/20/2017 CLINICAL DATA:  Cough, shortness of breath, chest pain EXAM: CHEST  2 VIEW COMPARISON:  None. FINDINGS: Lungs are clear.  No pleural effusion or pneumothorax. The heart is normal in size. Mild superior endplate compression fracture deformity at L2, age indeterminate. IMPRESSION: No evidence of acute cardiopulmonary disease. Mild superior endplate compression fracture deformity at L2, age indeterminate. Electronically Signed   By: Julian Hy M.D.   On: 01/20/2017 23:50   Mr Abdomen W Wo Contrast  Result Date: 01/21/2017 CLINICAL DATA:  Worsening abdominal pain x2 weeks, nausea/vomiting, back pain. Abnormal pancreas on CT, evaluate for underlying pancreatic neoplasm. EXAM: MRI ABDOMEN WITHOUT AND WITH CONTRAST TECHNIQUE: Multiplanar multisequence MR imaging of the abdomen was performed both before and after the administration of intravenous contrast. CONTRAST:  82m  MULTIHANCE GADOBENATE DIMEGLUMINE 529 MG/ML IV SOLN COMPARISON:  Unenhanced CT abdomen/ pelvis dated 01/21/2017 FINDINGS: Lower chest: Lung bases are clear. Hepatobiliary: Liver is within normal limits. No suspicious/enhancing hepatic lesions. Gallbladder is unremarkable. No intrahepatic or extrahepatic ductal dilatation Pancreas: Pancreatic head/ uncinate process and proximal pancreatic body are within normal limits. However, there is abrupt cut off of the pancreatic tail with surrounding scarring and adherence of adjacent structures, including the stomach (series 1203/ image 48). It is unclear whether this reflects an underlying hypoenhancing mass with fibrosis or is simply the sequela of prior inflammation/ pancreatitis. No superimposed acute inflammation is evident. No peripancreatic fluid/ pseudocyst. No associated pancreatic ductal dilatation. Spleen:  Within normal limits. Adrenals/Urinary Tract:  Adrenal glands are within normal limits. Kidneys are within normal limits.  No hydronephrosis. Stomach/Bowel: Mild angulation of the mid gastric body which is adherent to the pancreatic tail. Stomach is otherwise within normal limits. Visualized bowel is unremarkable. Vascular/Lymphatic:  No evidence of abdominal aortic aneurysm. No suspicious abdominal lymphadenopathy. Small peripancreatic lymph nodes measuring up to 7 mm short axis in the left upper abdomen (Series 5/ image 21). Other:  No abdominal ascites. Musculoskeletal: No focal osseous lesions. IMPRESSION: Focal scarring/ atrophy involving the mid/ distal pancreatic tail, with adherence of adjacent structures, including the stomach. While this may reflect sequela of prior pancreatitis in the appropriate clinical setting, underlying pancreatic mass is not  excluded. EUS is suggested for further evaluation. Adjacent small peripancreatic lymph nodes measuring up to 7 mm short axis, nonspecific. Electronically Signed   By: Julian Hy M.D.   On: 01/21/2017  12:41   US Abdomen Complete  Result Date: 01/21/2017 CLINICAL DATA:  61 y/o F; chest pain and abdominal pain with nausea and vomiting. EXAM: ABDOMEN ULTRASOUND COMPLETE COMPARISON:  None. FINDINGS: Gallbladder: No gallstones or wall thickening visualized. No sonographic Murphy sign noted by sonographer. Common bile duct: Diameter: 3.1 mm Liver: Diffusely increased echogenicity.  No focal lesion. IVC: No abnormality visualized. Pancreas: Largely obscured by bowel gas. Spleen: Size and appearance within normal limits. Right Kidney: Length: 11.2 cm. Echogenicity within normal limits. No mass or hydronephrosis visualized. Left Kidney: Length: 12.4 cm. Echogenicity within normal limits. No mass or hydronephrosis visualized. Abdominal aorta: Obscured by bowel gas. Visualized segment measures 2.6 cm, within normal limits. Other findings: None. IMPRESSION: 1. No acute process identified. 2. Hepatic steatosis. Electronically Signed   By: Kristine Garbe M.D.   On: 01/21/2017 02:08   Ct Renal Stone Study  Result Date: 01/21/2017 CLINICAL DATA:  Constant in worsening epigastric pain for 2 weeks. Back pain, nausea, vomiting, generalized body aches, and constipation. EXAM: CT ABDOMEN AND PELVIS WITHOUT CONTRAST TECHNIQUE: Multidetector CT imaging of the abdomen and pelvis was performed following the standard protocol without IV contrast. COMPARISON:  Ultrasound 01/21/2017 FINDINGS: Lower chest: Lung bases are clear. Hepatobiliary: No focal liver abnormality is seen. No gallstones, gallbladder wall thickening, or biliary dilatation. Pancreas: Linear infiltration around the pancreas with scarring and vascular retraction centered at the junction of the body and tail. Mildly enlarged celiac axis and peripancreatic lymph nodes. This may represent acute pancreatitis but an infiltrating mass lesion is not excluded. Consider further evaluation of the pancreas with contrast-enhanced pancreatic protocol CT or MRI. No  pancreatic ductal dilatation. No loculated fluid collections. Spleen: Normal in size without focal abnormality. Adrenals/Urinary Tract: Adrenal glands are unremarkable. Kidneys are normal, without renal calculi, focal lesion, or hydronephrosis. Bladder is unremarkable. Stomach/Bowel: Stomach, small bowel, and colon are not abnormally distended. No wall thickening or inflammatory changes appreciated. Appendix is not identified. Vascular/Lymphatic: No significant vascular findings are present. No enlarged abdominal or pelvic lymph nodes. Reproductive: Uterus and bilateral adnexa are unremarkable. Other: No free air or free fluid in the abdomen. Abdominal wall musculature appears intact. Musculoskeletal: Anterior compression of the L1 vertebral body, age indeterminate. IMPRESSION: There is infiltration around the pancreas with angular scarring and retraction of adjacent structures. Mild prominence of regional lymph nodes. Changes could be due to acute pancreatitis but appearance is worrisome for underlying neoplasm. Recommend further evaluation of the pancreas with contrast-enhanced pancreatic protocol CT or MRI. Anterior compression of the L1 vertebra, age indeterminate. Electronically Signed   By: Lucienne Capers M.D.   On: 01/21/2017 04:19   ROS: Constitutional: no chronic problems until the past 2 or 3 weeks ago HEENT: no problems with her eyes or years. Cardiovascular: no known heart disease or chest pain. Respiratory: negative GI: as above GU: Musculoskeletal: negative. Neuro/Psychiatric: history of anxiety and depression for which he is taking medication. Endocrine/Heme: negative            Blood pressure 109/89, pulse (!) 104, temperature 98 F (36.7 C), temperature source Oral, resp. rate 18, height _0  (1.626 m), weight 87.7 kg (193 lb 4.8 oz), SpO2 96 %.  Physical exam:   General-- pleasant white female in no acute distress. ENT-- nonicteric Neck-- supple  Heart-- regular rate  rhythm without murmurs are gallops Lungs-- clear Abdomen-- nondistended soft incompletely nontender with good bowel sounds. Psych-- alert and oriented, answers questions appropriately   Assessment: 1. Abdominal pain/pancreatic abnormality. It is possible that this could be a pancreatic tumor, however, the patient has a long history of alcohol abuse and has had several episodes in the past pancreatitis. This could all be scarring from past pancreatitis. 2. Bipolar disease and anxiety 3. History of alcohol abuse. No alcohol in over 4 years  Plan: this point I would treat her conservatively holder diet treat her pain and see how she does. She will likely need ERCP or MRCP some point in the future to evaluator pancreatic duct. She may need EUS as well. CA 19 9 is pending at this time.   Ilee Randleman JR,Oskar Cretella L 01/22/2017, 3:25 PM   This note was created using voice recognition software and minor errors may Have occurred unintentionally. Pager: [phone] If no answer or after hours call [phone]

## 2017-01-23 DIAGNOSIS — K5900 Constipation, unspecified: Secondary | ICD-10-CM

## 2017-01-23 LAB — COMPREHENSIVE METABOLIC PANEL
ALBUMIN: 3.6 g/dL (ref 3.5–5.0)
ALK PHOS: 51 U/L (ref 38–126)
ALT: 91 U/L — ABNORMAL HIGH (ref 14–54)
AST: 65 U/L — AB (ref 15–41)
Anion gap: 5 (ref 5–15)
CALCIUM: 8.5 mg/dL — AB (ref 8.9–10.3)
CO2: 22 mmol/L (ref 22–32)
CREATININE: 0.86 mg/dL (ref 0.44–1.00)
Chloride: 106 mmol/L (ref 101–111)
GFR calc Af Amer: >60 mL/min (ref >60)
GFR calc non Af Amer: >60 mL/min (ref >60)
GLUCOSE: 148 mg/dL — AB (ref 65–99)
Potassium: 3.8 mmol/L (ref 3.5–5.1)
SODIUM: 133 mmol/L — AB (ref 135–145)
Total Bilirubin: 1.5 mg/dL — ABNORMAL HIGH (ref 0.3–1.2)
Total Protein: 6.2 g/dL — ABNORMAL LOW (ref 6.5–8.1)

## 2017-01-23 LAB — HEMOGLOBIN A1C
Hgb A1c MFr Bld: 6.3 % — ABNORMAL HIGH (ref 4.8–5.6)
Mean Plasma Glucose: 134 mg/dL

## 2017-01-23 LAB — CEA: CEA: 1.3 ng/mL (ref 0.0–4.7)

## 2017-01-23 LAB — CANCER ANTIGEN 19-9: CA 19-9: 23 U/mL (ref 0–35)

## 2017-01-23 MED ORDER — BISACODYL 10 MG RE SUPP: 10.0000 mg | Freq: Every day | RECTAL | 0 refills | Status: DC | PRN

## 2017-01-23 MED ORDER — PANTOPRAZOLE SODIUM 40 MG PO TBEC: 40.0000 mg | DELAYED_RELEASE_TABLET | Freq: Two times a day (BID) | ORAL | 0 refills | Status: DC

## 2017-01-23 MED ORDER — IBUPROFEN 200 MG PO TABS
200.0000 mg | ORAL_TABLET | ORAL | Status: DC | PRN
  Administered 2017-01-23: 200 mg via ORAL
  Filled 2017-01-23: qty 1

## 2017-01-23 MED ORDER — KETOROLAC TROMETHAMINE 15 MG/ML IJ SOLN: 15.0000 mg | Freq: Four times a day (QID) | INTRAMUSCULAR | Status: DC | PRN

## 2017-01-23 MED ORDER — NAPROXEN 250 MG PO TABS: 250.0000 mg | ORAL_TABLET | Freq: Two times a day (BID) | ORAL | 0 refills | Status: DC

## 2017-01-23 MED ORDER — ONDANSETRON HCL 4 MG PO TABS: 4.0000 mg | ORAL_TABLET | Freq: Three times a day (TID) | ORAL | 0 refills | Status: DC | PRN

## 2017-01-23 MED ORDER — PROMETHAZINE HCL 12.5 MG PO TABS: 12.5000 mg | ORAL_TABLET | Freq: Four times a day (QID) | ORAL | 0 refills | Status: DC | PRN

## 2017-01-23 MED ORDER — POLYETHYLENE GLYCOL 3350 17 G PO PACK: 17.0000 g | PACK | Freq: Two times a day (BID) | ORAL | 0 refills | Status: DC

## 2017-01-23 MED ORDER — BISACODYL 10 MG RE SUPP: 10.0000 mg | Freq: Every day | RECTAL | Status: DC | PRN

## 2017-01-23 MED FILL — ?PANTOPRAZOLE SOD DR 40MG: 40 MG | 30 days supply | Qty: 60 | Fill #0

## 2017-01-23 NOTE — Discharge Summary (Addendum)
Physician Discharge Summary  Paige Andrews YQI:[id number] DOB: 01-27-1956 DOA: 01/21/2017  PCP: No PCP Per Patient  Admit date: 01/21/2017 Discharge date: 01/23/2017  Admitted From: home  Disposition:  home  Recommendations for Outpatient Follow-up:  1. Patient has no insurance:  [employer] and wellness for prescriptions and follow up appointment 2. Follow up with [facility] for EUS   Home Health:  none  Equipment/Devices:  None  Discharge Condition:  Stable, improved CODE STATUS:  DNR Diet recommendation:  Small volumes of fluids and food throughout the day   Brief/Interim Summary:  Paige Andrews a 61 y.o.femalewith medical history significant of HTN, EtOH pancreatitis, and IBS who presents to the Emergency Department complaining of progressively worsening, constant epigastric pain x 2 weeks but worsened significantly a few days prior to admission.  She has had associated back pain, nausea, vomiting.  She has had a 5-6-lb weight loss over the last month.  CT demonstrated a concerning pancreatic mass.  MRI confirmed an area of focal scarring/atrophy in the mid/distal pancreatic tail with adherence to adjacent structures including the stomach. She has had previous pancreatitis which could explain scar, however, pancreatic mass was not excluded.  GI was consulted for possible EUS which will be arranged as an outpatient.  Her CA 19-9 and CEA were normal.  Her nausea and vomiting improved with zofran and phenergan and PPI.  She asked for high doses of narcotics for pain management but ultimately was discharged with advice to use tylenol and ibuprofen for pain.  I strongly suspect that her symptoms are exacerbated by depression and anxiety and high level of stress due to lack of money.  She is applying for food stamps, medicaid, and other assistance.  She has previously taken medication for anxiety and may benefit from SSRI again.  Given her history of EtOH abuse, recommend against resuming her  xanax and valium which she tapered off of when she moved from [place] to [place].    Discharge Diagnoses:  Principal Problem:   Pancreatic abnormality Active Problems:   Nausea & vomiting   Transaminitis   Constipation  Nausea, vomiting and abdominal pain in setting of pancreatic mass/scarring.  LFTs mildly elevated but bilirubin and lipase are normal.  Symptoms may be related to IBS, recent virus, constipation.  Symptoms improved after constipation resolved. -  Recommended small frequent sips and meals -  Rx given for zofran and phenergan -  Started BID PPI -  GI recommends outpatient EUS to further evaluate pancreatic mass -  CEA and CA-19-9 negative  Possible chest discomfort, atypical.  Initial troponin negative but will check one more -  ECG:  NSR, no evidence of acute ischemia -  Troponin negative  Constipation, resolved with enema -  Continue miralax, colace  Hyperglycemia -  A1c 5.3  Hyponatremia with metabolic acidosis, likely due to dehydration, resolved with IVF  Hypokalemia, resolved with potassium in IVF  Discharge Instructions  Discharge Instructions    Call MD for:  difficulty breathing, headache or visual disturbances    Complete by:  As directed    Call MD for:  extreme fatigue    Complete by:  As directed    Call MD for:  hives    Complete by:  As directed    Call MD for:  persistant dizziness or light-headedness    Complete by:  As directed    Call MD for:  persistant nausea and vomiting    Complete by:  As directed    Call  MD for:  severe uncontrolled pain    Complete by:  As directed    Call MD for:  temperature >100.4    Complete by:  As directed    Diet general    Complete by:  As directed    Increase activity slowly    Complete by:  As directed        Medication List    STOP taking these medications   nortriptyline 25 MG capsule Commonly known as:  PAMELOR   permethrin 5 % cream Commonly known as:  ACTICIN     TAKE  these medications   bisacodyl 10 MG suppository Commonly known as:  DULCOLAX Place 1 suppository (10 mg total) rectally daily as needed for mild constipation or moderate constipation.   naproxen 250 MG tablet Commonly known as:  NAPROSYN Take 1 tablet (250 mg total) by mouth 2 (two) times daily with a meal.   ondansetron 4 MG tablet Commonly known as:  ZOFRAN Take 1 tablet (4 mg total) by mouth every 8 (eight) hours as needed for nausea or vomiting.   pantoprazole 40 MG tablet Commonly known as:  PROTONIX Take 1 tablet (40 mg total) by mouth 2 (two) times daily.   polyethylene glycol packet Commonly known as:  MIRALAX Take 17 g by mouth 2 (two) times daily.   promethazine 12.5 MG tablet Commonly known as:  PHENERGAN Take 1 tablet (12.5 mg total) by mouth every 6 (six) hours as needed for nausea or vomiting.      Follow-up Information    [facility]. Go on 01/25/2017.   Why:  at 11:30 am. Bring photo ID and discharge papers from the hospital. Contact information: [place] [phone] [place], MD. Schedule an appointment as soon as possible for a visit in 3 week(s).   Specialty:  Gastroenterology Contact information: [address] [place] [address] [phone]          Allergies  Allergen Reactions  . Morphine And Related Anxiety    Patient states, " it makes me  feel like I am crawling out of my skin"    Consultations: [facility]   Procedures/Studies: Dg Chest 2 View  Result Date: 01/20/2017 CLINICAL DATA:  Cough, shortness of breath, chest pain EXAM: CHEST  2 VIEW COMPARISON:  None. FINDINGS: Lungs are clear.  No pleural effusion or pneumothorax. The heart is normal in size. Mild superior endplate compression fracture deformity at L2, age indeterminate. IMPRESSION: No evidence of acute cardiopulmonary disease. Mild superior endplate  compression fracture deformity at L2, age indeterminate. Electronically Signed   By: Julian Hy M.D.   On: 01/20/2017 23:50   Mr Abdomen W Wo Contrast  Result Date: 01/21/2017 CLINICAL DATA:  Worsening abdominal pain x2 weeks, nausea/vomiting, back pain. Abnormal pancreas on CT, evaluate for underlying pancreatic neoplasm. EXAM: MRI ABDOMEN WITHOUT AND WITH CONTRAST TECHNIQUE: Multiplanar multisequence MR imaging of the abdomen was performed both before and after the administration of intravenous contrast. CONTRAST:  47m MULTIHANCE GADOBENATE DIMEGLUMINE 529 MG/ML IV SOLN COMPARISON:  Unenhanced CT abdomen/ pelvis dated 01/21/2017 FINDINGS: Lower chest: Lung bases are clear. Hepatobiliary: Liver is within normal limits. No suspicious/enhancing hepatic lesions. Gallbladder is unremarkable. No intrahepatic or extrahepatic ductal dilatation Pancreas: Pancreatic head/ uncinate process and proximal pancreatic body are within normal limits. However, there is abrupt cut off of the pancreatic tail with surrounding scarring  and adherence of adjacent structures, including the stomach (series 1203/ image 48). It is unclear whether this reflects an underlying hypoenhancing mass with fibrosis or is simply the sequela of prior inflammation/ pancreatitis. No superimposed acute inflammation is evident. No peripancreatic fluid/ pseudocyst. No associated pancreatic ductal dilatation. Spleen:  Within normal limits. Adrenals/Urinary Tract:  Adrenal glands are within normal limits. Kidneys are within normal limits.  No hydronephrosis. Stomach/Bowel: Mild angulation of the mid gastric body which is adherent to the pancreatic tail. Stomach is otherwise within normal limits. Visualized bowel is unremarkable. Vascular/Lymphatic:  No evidence of abdominal aortic aneurysm. No suspicious abdominal lymphadenopathy. Small peripancreatic lymph nodes measuring up to 7 mm Omran Keelin axis in the left upper abdomen (Series 5/ image 21). Other:   No abdominal ascites. Musculoskeletal: No focal osseous lesions. IMPRESSION: Focal scarring/ atrophy involving the mid/ distal pancreatic tail, with adherence of adjacent structures, including the stomach. While this may reflect sequela of prior pancreatitis in the appropriate clinical setting, underlying pancreatic mass is not excluded. EUS is suggested for further evaluation. Adjacent small peripancreatic lymph nodes measuring up to 7 mm Nirvana Blanchett axis, nonspecific. Electronically Signed   By: Julian Hy M.D.   On: 01/21/2017 12:41   US Abdomen Complete  Result Date: 01/21/2017 CLINICAL DATA:  61 y/o F; chest pain and abdominal pain with nausea and vomiting. EXAM: ABDOMEN ULTRASOUND COMPLETE COMPARISON:  None. FINDINGS: Gallbladder: No gallstones or wall thickening visualized. No sonographic Murphy sign noted by sonographer. Common bile duct: Diameter: 3.1 mm Liver: Diffusely increased echogenicity.  No focal lesion. IVC: No abnormality visualized. Pancreas: Largely obscured by bowel gas. Spleen: Size and appearance within normal limits. Right Kidney: Length: 11.2 cm. Echogenicity within normal limits. No mass or hydronephrosis visualized. Left Kidney: Length: 12.4 cm. Echogenicity within normal limits. No mass or hydronephrosis visualized. Abdominal aorta: Obscured by bowel gas. Visualized segment measures 2.6 cm, within normal limits. Other findings: None. IMPRESSION: 1. No acute process identified. 2. Hepatic steatosis. Electronically Signed   By: Kristine Garbe M.D.   On: 01/21/2017 02:08   Ct Renal Stone Study  Result Date: 01/21/2017 CLINICAL DATA:  Constant in worsening epigastric pain for 2 weeks. Back pain, nausea, vomiting, generalized body aches, and constipation. EXAM: CT ABDOMEN AND PELVIS WITHOUT CONTRAST TECHNIQUE: Multidetector CT imaging of the abdomen and pelvis was performed following the standard protocol without IV contrast. COMPARISON:  Ultrasound 01/21/2017 FINDINGS:  Lower chest: Lung bases are clear. Hepatobiliary: No focal liver abnormality is seen. No gallstones, gallbladder wall thickening, or biliary dilatation. Pancreas: Linear infiltration around the pancreas with scarring and vascular retraction centered at the junction of the body and tail. Mildly enlarged celiac axis and peripancreatic lymph nodes. This may represent acute pancreatitis but an infiltrating mass lesion is not excluded. Consider further evaluation of the pancreas with contrast-enhanced pancreatic protocol CT or MRI. No pancreatic ductal dilatation. No loculated fluid collections. Spleen: Normal in size without focal abnormality. Adrenals/Urinary Tract: Adrenal glands are unremarkable. Kidneys are normal, without renal calculi, focal lesion, or hydronephrosis. Bladder is unremarkable. Stomach/Bowel: Stomach, small bowel, and colon are not abnormally distended. No wall thickening or inflammatory changes appreciated. Appendix is not identified. Vascular/Lymphatic: No significant vascular findings are present. No enlarged abdominal or pelvic lymph nodes. Reproductive: Uterus and bilateral adnexa are unremarkable. Other: No free air or free fluid in the abdomen. Abdominal wall musculature appears intact. Musculoskeletal: Anterior compression of the L1 vertebral body, age indeterminate. IMPRESSION: There is infiltration around the pancreas with angular scarring  and retraction of adjacent structures. Mild prominence of regional lymph nodes. Changes could be due to acute pancreatitis but appearance is worrisome for underlying neoplasm. Recommend further evaluation of the pancreas with contrast-enhanced pancreatic protocol CT or MRI. Anterior compression of the L1 vertebra, age indeterminate. Electronically Signed   By: Lucienne Capers M.D.   On: 01/21/2017 04:19   Subjective: Feeling better today.  Able to ambulate in the halls.  Had additional bowel movements which have helped her back and abdominal pain.   Persistent nausea, but less vomiting and tolerated more food and fluids yesterday.    Discharge Exam: Vitals:   01/22/17 2116 01/23/17 0446  BP: 136/83 131/83  Pulse: 96 92  Resp: 18 16  Temp: 98 F (36.7 C) 97.9 F (36.6 C)   Vitals:   01/22/17 0504 01/22/17 1457 01/22/17 2116 01/23/17 0446  BP: 119/90 109/89 136/83 131/83  Pulse: 87 (!) 104 96 92  Resp: 18 18 18 16   Temp: 98.6 F (37 C) 98 F (36.7 C) 98 F (36.7 C) 97.9 F (36.6 C)  TempSrc: Oral Oral Oral Oral  SpO2: 100% 96% 98% 99%  Weight:      Height:        General exam:  Adult female, lying in bed.  No acute distress.  HEENT:  NCAT, MMM Respiratory system: Clear to auscultation bilaterally Cardiovascular system: Regular rate and rhythm, normal S1/S2. No murmurs, rubs, gallops or clicks.  Warm extremities Gastrointestinal system: Normal active bowel sounds, soft, nondistended, nontender.  No rebound or guarding.   MSK:  Normal tone and bulk, no lower extremity edema Neuro:  Grossly intact     The results of significant diagnostics from this hospitalization (including imaging, microbiology, ancillary and laboratory) are listed below for reference.     Microbiology: No results found for this or any previous visit (from the past 240 hour(s)).   Labs: BNP (last 3 results) No results for input(s): BNP in the last 8760 hours. Basic Metabolic Panel:  Recent Labs Lab 01/20/17 2324 01/22/17 0423 01/23/17 0413  NA 131* 137 133*  K 3.4* 3.7 3.8  CL 101 107 106  CO2 18* 25 22  GLUCOSE 181* 154* 148*  BUN 9 <5* <5*  CREATININE 0.79 0.76 0.86  CALCIUM 9.5 8.7* 8.5*   Liver Function Tests:  Recent Labs Lab 01/20/17 2324 01/22/17 0423 01/23/17 0413  AST 80* 64* 65*  ALT 119* 93* 91*  ALKPHOS 67 58 51  BILITOT 0.9 1.1 1.5*  PROT 7.7 6.9 6.2*  ALBUMIN 4.2 4.0 3.6    Recent Labs Lab 01/20/17 2324  LIPASE 19   No results for input(s): AMMONIA in the last 168 hours. CBC:  Recent Labs Lab  01/20/17 2324  WBC 10.3  HGB 15.4*  HCT 42.3  MCV 94.8  PLT 174   Cardiac Enzymes:  Recent Labs Lab 01/22/17 0908  TROPONINI <0.03   BNP: Invalid input(s): POCBNP CBG: No results for input(s): GLUCAP in the last 168 hours. D-Dimer No results for input(s): DDIMER in the last 72 hours. Hgb A1c  Recent Labs  01/22/17 0423  HGBA1C 6.3*   Lipid Profile No results for input(s): CHOL, HDL, LDLCALC, TRIG, CHOLHDL, LDLDIRECT in the last 72 hours. Thyroid function studies No results for input(s): TSH, T4TOTAL, T3FREE, THYROIDAB in the last 72 hours.  Invalid input(s): FREET3 Anemia work up No results for input(s): VITAMINB12, FOLATE, FERRITIN, TIBC, IRON, RETICCTPCT in the last 72 hours. Urinalysis    Component Value Date/Time  COLORURINE AMBER (A) 01/21/2017 0049   APPEARANCEUR HAZY (A) 01/21/2017 0049   LABSPEC 1.025 01/21/2017 0049   PHURINE 5.0 01/21/2017 0049   GLUCOSEU 150 (A) 01/21/2017 0049   HGBUR NEGATIVE 01/21/2017 0049   BILIRUBINUR NEGATIVE 01/21/2017 0049   KETONESUR 5 (A) 01/21/2017 0049   PROTEINUR 30 (A) 01/21/2017 0049   NITRITE NEGATIVE 01/21/2017 0049   LEUKOCYTESUR NEGATIVE 01/21/2017 0049   Sepsis Labs Invalid input(s): PROCALCITONIN,  WBC,  LACTICIDVEN   Time coordinating discharge: Over 30 minutes  SIGNED:   Janece Canterbury, MD  [facility] 01/23/2017, 5:02 PM Pager   If 7PM-7AM, please contact night-coverage [email or web address] Password [email or web address]

## 2017-01-25 ENCOUNTER — Ambulatory Visit: Payer: Self-pay | Attending: Internal Medicine | Admitting: Physician Assistant

## 2017-01-25 ENCOUNTER — Encounter: Payer: Self-pay | Admitting: Licensed Clinical Social Worker

## 2017-01-25 ENCOUNTER — Encounter: Payer: Self-pay | Admitting: Physician Assistant

## 2017-01-25 VITALS — BP 126/89 | HR 107 | Temp 98.5°F | Resp 16 | Wt 195.0 lb

## 2017-01-25 DIAGNOSIS — R7989 Other specified abnormal findings of blood chemistry: Secondary | ICD-10-CM | POA: Insufficient documentation

## 2017-01-25 DIAGNOSIS — Q453 Other congenital malformations of pancreas and pancreatic duct: Secondary | ICD-10-CM

## 2017-01-25 DIAGNOSIS — F319 Bipolar disorder, unspecified: Secondary | ICD-10-CM | POA: Insufficient documentation

## 2017-01-25 DIAGNOSIS — Z79899 Other long term (current) drug therapy: Secondary | ICD-10-CM | POA: Insufficient documentation

## 2017-01-25 DIAGNOSIS — E871 Hypo-osmolality and hyponatremia: Secondary | ICD-10-CM | POA: Insufficient documentation

## 2017-01-25 DIAGNOSIS — R109 Unspecified abdominal pain: Secondary | ICD-10-CM | POA: Insufficient documentation

## 2017-01-25 DIAGNOSIS — I10 Essential (primary) hypertension: Secondary | ICD-10-CM | POA: Insufficient documentation

## 2017-01-25 DIAGNOSIS — Z885 Allergy status to narcotic agent status: Secondary | ICD-10-CM | POA: Insufficient documentation

## 2017-01-25 DIAGNOSIS — K900 Celiac disease: Secondary | ICD-10-CM | POA: Insufficient documentation

## 2017-01-25 DIAGNOSIS — F329 Major depressive disorder, single episode, unspecified: Secondary | ICD-10-CM

## 2017-01-25 DIAGNOSIS — F419 Anxiety disorder, unspecified: Secondary | ICD-10-CM | POA: Insufficient documentation

## 2017-01-25 DIAGNOSIS — K589 Irritable bowel syndrome without diarrhea: Secondary | ICD-10-CM | POA: Insufficient documentation

## 2017-01-25 DIAGNOSIS — R933 Abnormal findings on diagnostic imaging of other parts of digestive tract: Secondary | ICD-10-CM | POA: Insufficient documentation

## 2017-01-25 DIAGNOSIS — R945 Abnormal results of liver function studies: Secondary | ICD-10-CM

## 2017-01-25 DIAGNOSIS — G4700 Insomnia, unspecified: Secondary | ICD-10-CM | POA: Insufficient documentation

## 2017-01-25 DIAGNOSIS — F418 Other specified anxiety disorders: Secondary | ICD-10-CM

## 2017-01-25 MED ORDER — TRAZODONE HCL 50 MG PO TABS: ORAL_TABLET | ORAL | 3 refills | Status: DC

## 2017-01-25 NOTE — Patient Instructions (Addendum)
EDWARDS JR,JAMES L, MD. Schedule an appointment as soon as possible for a visit in 3 week(s).   Specialty:  Gastroenterology Contact information: [address] [place] [address] [phone]   Schedule lab appointment at time of financial appt in the next week or 2.

## 2017-01-25 NOTE — Progress Notes (Signed)
Paige Andrews, is a 61 y.o. female  YHC:[id number]  DVV:[id number]  DOB - December 18, 1955  Subjective:  Chief Complaint and HPI: Paige Andrews is a 61 y.o. female here today to establish care and for a follow up visit after being hospitalized 01/21/2017-01/23/2017 for epigastric pain and presumed pancreatitis.  An abnormal area was found on MRI/CT that could not be R/O as pancreatic mass and she has a GI f/up appt regarding that on 02/20/2017.  She has a h/o alcoholism but has not had a a drink since severe pancreatitis in about 2012.  She does not attend AA.  She is only having mild abdominal pain and most of her symptoms have resolved since hospitalization.  She has not picked up her prescriptions yet.  She was being seen by [facility] in the fall but stopped going to f/up appts. She is having trouble sleeping.  She can't go to sleep or stay asleep.  She has taken [place] in the past.  Zyprexa also helped.  She has been on various meds for anxiety and depression but has never found the right fit.  She denies SI/HI  Recent ED/Hospital notes reviewed.  AA 19-9 and CEA were normal.    ROS:   Constitutional:  No f/c, No night sweats, No unexplained weight loss. EENT:  No vision changes, No blurry vision, No hearing changes. No mouth, throat, or ear problems.  Respiratory: No cough, No SOB Cardiac: No CP, no palpitations GI:  Mild abd pain, No N/V/D. GU: No Urinary s/sx Musculoskeletal: No joint pain Neuro: No headache, no dizziness, no motor weakness.  Skin: No rash Endocrine:  No polydipsia. No polyuria.  Psych: Denies SI/HI.  Admits to anxiety and depression  No problems updated.  ALLERGIES: Allergies  Allergen Reactions  . Morphine And Related Anxiety    Patient states, " it makes me  feel like I am crawling out of my skin"    PAST MEDICAL HISTORY: Past Medical History:  Diagnosis Date  . Anxiety   . Bipolar 1 disorder ([place])   . Celiac disease   . Depression   .  Hypertension   . IBS (irritable bowel syndrome)     MEDICATIONS AT HOME: Prior to Admission medications   Medication Sig Start Date End Date Taking? Authorizing Provider  pantoprazole (PROTONIX) 40 MG tablet Take 1 tablet (40 mg total) by mouth 2 (two) times daily. 01/23/17  Yes Janece Canterbury, MD  bisacodyl (DULCOLAX) 10 MG suppository Place 1 suppository (10 mg total) rectally daily as needed for mild constipation or moderate constipation. Patient not taking: Reported on 01/25/2017 01/23/17   Janece Canterbury, MD  naproxen (NAPROSYN) 250 MG tablet Take 1 tablet (250 mg total) by mouth 2 (two) times daily with a meal. Patient not taking: Reported on 01/25/2017 01/23/17   Janece Canterbury, MD  ondansetron (ZOFRAN) 4 MG tablet Take 1 tablet (4 mg total) by mouth every 8 (eight) hours as needed for nausea or vomiting. Patient not taking: Reported on 01/25/2017 01/23/17   Janece Canterbury, MD  polyethylene glycol [facility]) packet Take 17 g by mouth 2 (two) times daily. Patient not taking: Reported on 01/25/2017 01/23/17   Janece Canterbury, MD  promethazine (PHENERGAN) 12.5 MG tablet Take 1 tablet (12.5 mg total) by mouth every 6 (six) hours as needed for nausea or vomiting. 01/23/17   Janece Canterbury, MD  traZODone (DESYREL) 50 MG tablet 1-1/12 at bedtime prn 01/25/17   Argentina Donovan, PA-C     Objective:  EXAM:   Vitals:   01/25/17 1148  BP: 126/89  Pulse: (!) 107  Resp: 16  Temp: 98.5 F (36.9 C)  TempSrc: Oral  SpO2: 95%  Weight: 195 lb (88.5 kg)    General appearance : A&OX3. NAD. Non-toxic-appearing. Appears anxious HEENT: Atraumatic and Normocephalic.  PERRLA. EOM intact.  TM clear B. Mouth-MMM, post pharynx WNL w/o erythema, No PND. Neck: supple, no JVD. No cervical lymphadenopathy. No thyromegaly Chest/Lungs:  Breathing-non-labored, Good air entry bilaterally, breath sounds normal without rales, rhonchi, or wheezing  CVS: S1 S2 regular, no murmurs, gallops, rubs  Abdomen: Bowel  sounds present, Non tender and not distended with no gaurding, rigidity or rebound. Extremities: Bilateral Lower Ext shows no edema, both legs are warm to touch with = pulse throughout Neurology:  CN II-XII grossly intact, Non focal.   Psych:  TP linear. J/I WNL. Normal speech. Appropriate eye contact and affect.  Skin:  No Rash  Data Review Lab Results  Component Value Date   HGBA1C 6.3 (H) 01/22/2017     Assessment & Plan   1. Insomnia, unspecified type Will try- traZODone (DESYREL) 50 MG tablet; 1-1/12 at bedtime prn  Dispense: 45 tablet; Refill: 3  2. Elevated LFTs Check labs in 1 week - Comprehensive metabolic panel; Future  3. Hyponatremia Check in 1 week - Comprehensive metabolic panel; Future  4. Pancreatic abnormality Keep appt on February 20, 2017 [facility] L, MD. Schedule an appointment as soon as possible for a visit in 3 week(s).   Specialty:  Gastroenterology Contact information: [address] [place] [address] [phone]  5. Anxiety and depression I really think she could benefit from attending [place] meetings.  I have encouraged her to do so and I gave her a schedule.  As of now, she will f/up at [facility] and met with Christa See, social worker today.  Resources given.  Consider SSRI but she defers today.     Patient have been counseled extensively about nutrition and exercise  Return in about 4 weeks (around 02/22/2017) for assign PCP, f/up abnormal labs/insomnia.  The patient was given clear instructions to go to ER or return to medical center if symptoms don't improve, worsen or new problems develop. The patient verbalized understanding. The patient was told to call to get lab results if they haven't heard anything in the next week.     Freeman Caldron, PA-C [facility], [place]   01/25/2017, 1:21 PMPatient ID: Paige Andrews, female   DOB: [birth year], 61 y.o.   MRN:  [id number]

## 2017-01-26 NOTE — BH Specialist Note (Signed)
Session Start time: 12:00 PM   End Time: 12:30 PM Total Time:  30 minutes Type of Service: Charlo: No.   Interpreter Name & Language: N/A # [facility] Visits July 2017-June 2018: 1st   SUBJECTIVE: Paige Andrews is a 61 y.o. female  Pt. was referred by Weyman Pedro for:  anxiety, depression and community resources (sobriety support). Pt. reports the following symptoms/concerns: difficulty focusing, frequent panic attacks, racing thoughts, difficulty sleeping, and low energy Duration of problem:  Ongoing Pt reported that she was diagnosed with Bipolar, Depression, and Anxiety at age 19 Severity: severe Previous treatment: Pt was receiving behavioral health services through [facility] in August 2017. She is not participating in services at this time   OBJECTIVE: Mood: Anxious & Affect: Appropriate Risk of harm to self or others: Pt denied SI/HI/AVH Assessments administered: PHQ-9; GAD-7  LIFE CONTEXT:  Family & Social: Pt resides with long time boyfriend who accompanied pt during visit. She has an adult daughter who resides in [place].  School/ Work: Pt is unemployed. She was denied disability and social security benefits. Pt plans to apply for food stamps in the near future Self-Care: Pt has hx of substance use (alcohol) She has been sober since 2012 Life changes: Pt is experiencing financial strain What is important to pt/family (values): Family, Independence, Good Health   GOALS ADDRESSED:  Decrease symptoms of depression Decrease symptoms of anxiety Increase adequate support system for patient Increase knowledge of coping skills  INTERVENTIONS: Solution Focused, Strength-based and Supportive   ASSESSMENT:  Pt currently experiencing depression and anxiety triggered by financial strain. She reports difficulty focusing, frequent panic attacks, racing thoughts, difficulty sleeping, and low energy. Pt was accompanied by long time boyfriend  during visit and reports receiving support from him and his siblings. Pt may benefit from psychotherapy and medication management. LCSWA educated pt on the cycle of depression and the importance of implementing healthy coping strategies to decrease symptoms. Pt was successful in identifying healthy strategies to utilize on a daily basis. She reports participating in [place] in [place]; however, is not interested in attending meetings at this time. LCSWA disscussed benefits of scheduling an appointment with Financial Counseling to assist with financial strain. Pt is aware of community agencies that provide therapy, medication management, and crisis interventions. LCSWA provided family with resources for crisis resources, sleep hygiene, and food insecurity.      PLAN: 1. F/U with behavioral health clinician: Pt was encouraged to contact [facility] if symptoms worsen or fail to improve to schedule behavioral appointments at [facility]. 2. Behavioral Health meds: Desyrel 3. Behavioral recommendations: LCSWA recommends that pt apply healthy coping skills discussed, initiate behavioral health services, and utilize community resources. Pt is encouraged to schedule follow up appointment with LCSWA 4. Referral: Brief Counseling/Psychotherapy, [employer], Problem-solving teaching/coping strategies, Psychoeducation and Supportive Counseling 5. From scale of 1-10, how likely are you to follow plan: 7/10   Rebekah Chesterfield, MSW, [place] Worker 01/26/17 2:30 PM  Warmhandoff:   Warm Hand Off Completed.

## 2017-02-09 ENCOUNTER — Ambulatory Visit: Payer: Self-pay | Attending: Internal Medicine

## 2017-02-09 ENCOUNTER — Ambulatory Visit: Payer: Self-pay

## 2017-02-09 DIAGNOSIS — R7989 Other specified abnormal findings of blood chemistry: Secondary | ICD-10-CM

## 2017-02-09 DIAGNOSIS — R945 Abnormal results of liver function studies: Principal | ICD-10-CM

## 2017-02-09 DIAGNOSIS — E871 Hypo-osmolality and hyponatremia: Secondary | ICD-10-CM | POA: Insufficient documentation

## 2017-02-09 LAB — COMPREHENSIVE METABOLIC PANEL
ALBUMIN: 4.2 g/dL (ref 3.6–5.1)
ALK PHOS: 61 U/L (ref 33–130)
ALT: 96 U/L — ABNORMAL HIGH (ref 6–29)
AST: 69 U/L — ABNORMAL HIGH (ref 10–35)
BILIRUBIN TOTAL: 1.1 mg/dL (ref 0.2–1.2)
BUN: 6 mg/dL — ABNORMAL LOW (ref 7–25)
CALCIUM: 9.8 mg/dL (ref 8.6–10.4)
CO2: 23 mmol/L (ref 20–31)
CREATININE: 0.83 mg/dL (ref 0.50–0.99)
Chloride: 105 mmol/L (ref 98–110)
GLUCOSE: 145 mg/dL — AB (ref 65–99)
Potassium: 4 mmol/L (ref 3.5–5.3)
Sodium: 138 mmol/L (ref 135–146)
TOTAL PROTEIN: 6.9 g/dL (ref 6.1–8.1)

## 2017-02-14 ENCOUNTER — Telehealth: Payer: Self-pay

## 2017-02-14 NOTE — Telephone Encounter (Signed)
Contacted pt to go over lab results pt is aware of results and doesn't have any questions or concerns 

## 2017-02-22 ENCOUNTER — Ambulatory Visit: Payer: Self-pay | Admitting: Family Medicine

## 2017-03-06 ENCOUNTER — Telehealth: Payer: Self-pay | Admitting: General Practice

## 2017-03-06 MED ORDER — PANTOPRAZOLE SODIUM 40 MG PO TBEC: 40.0000 mg | DELAYED_RELEASE_TABLET | Freq: Two times a day (BID) | ORAL | 0 refills | Status: DC

## 2017-03-06 NOTE — Telephone Encounter (Signed)
Pt calling in to request a refill of her pantoprazole. Please f/u. Thank you.

## 2017-03-06 NOTE — Telephone Encounter (Signed)
Pantoprazole refilled x 30 days - patient needs office visit to be established for further refills.

## 2017-03-30 ENCOUNTER — Encounter: Payer: Self-pay | Admitting: Family Medicine

## 2017-03-30 ENCOUNTER — Ambulatory Visit: Payer: Self-pay | Attending: Family Medicine | Admitting: Family Medicine

## 2017-03-30 VITALS — BP 130/86 | HR 83 | Temp 98.3°F | Ht 64.0 in | Wt 189.4 lb

## 2017-03-30 DIAGNOSIS — K869 Disease of pancreas, unspecified: Secondary | ICD-10-CM | POA: Insufficient documentation

## 2017-03-30 DIAGNOSIS — Z79899 Other long term (current) drug therapy: Secondary | ICD-10-CM | POA: Insufficient documentation

## 2017-03-30 DIAGNOSIS — K219 Gastro-esophageal reflux disease without esophagitis: Secondary | ICD-10-CM | POA: Insufficient documentation

## 2017-03-30 DIAGNOSIS — Z885 Allergy status to narcotic agent status: Secondary | ICD-10-CM | POA: Insufficient documentation

## 2017-03-30 DIAGNOSIS — F319 Bipolar disorder, unspecified: Secondary | ICD-10-CM | POA: Insufficient documentation

## 2017-03-30 DIAGNOSIS — G4700 Insomnia, unspecified: Secondary | ICD-10-CM | POA: Insufficient documentation

## 2017-03-30 DIAGNOSIS — F419 Anxiety disorder, unspecified: Secondary | ICD-10-CM | POA: Insufficient documentation

## 2017-03-30 DIAGNOSIS — K589 Irritable bowel syndrome without diarrhea: Secondary | ICD-10-CM | POA: Insufficient documentation

## 2017-03-30 DIAGNOSIS — I10 Essential (primary) hypertension: Secondary | ICD-10-CM | POA: Insufficient documentation

## 2017-03-30 DIAGNOSIS — K900 Celiac disease: Secondary | ICD-10-CM | POA: Insufficient documentation

## 2017-03-30 DIAGNOSIS — K2100 Gastro-esophageal reflux disease with esophagitis, without bleeding: Secondary | ICD-10-CM

## 2017-03-30 DIAGNOSIS — K580 Irritable bowel syndrome with diarrhea: Secondary | ICD-10-CM | POA: Insufficient documentation

## 2017-03-30 DIAGNOSIS — Q453 Other congenital malformations of pancreas and pancreatic duct: Secondary | ICD-10-CM

## 2017-03-30 MED ORDER — PROMETHAZINE HCL 12.5 MG PO TABS: 12.5000 mg | ORAL_TABLET | Freq: Three times a day (TID) | ORAL | 1 refills | Status: DC | PRN

## 2017-03-30 MED ORDER — PANTOPRAZOLE SODIUM 40 MG PO TBEC: 40.0000 mg | DELAYED_RELEASE_TABLET | Freq: Two times a day (BID) | ORAL | 2 refills | Status: DC

## 2017-03-30 MED ORDER — POLYETHYLENE GLYCOL 3350 17 G PO PACK: 17.0000 g | PACK | Freq: Two times a day (BID) | ORAL | 2 refills | Status: DC

## 2017-03-30 MED ORDER — TRAZODONE HCL 50 MG PO TABS: 50.0000 mg | ORAL_TABLET | Freq: Every day | ORAL | 3 refills | Status: DC

## 2017-03-30 MED ORDER — NAPROXEN 250 MG PO TABS: 250.0000 mg | ORAL_TABLET | Freq: Two times a day (BID) | ORAL | 0 refills | Status: DC

## 2017-03-30 MED FILL — PANTOPRAZOLE SOD DR 40 MG T: 40 | 30 days supply | Qty: 60 | Fill #0

## 2017-03-30 MED FILL — PROMETHAZINE 12.5 MG TABLET: 12.5 | 10 days supply | Qty: 30 | Fill #0

## 2017-03-30 MED FILL — ONDANSETRON HCL 4 MG TABLET: 4 | 6 days supply | Qty: 20 | Fill #0

## 2017-03-30 MED FILL — traZODone HCL 50 MG TABS: 50 | 30 days supply | Qty: 30 | Fill #0

## 2017-03-30 NOTE — Progress Notes (Signed)
Subjective:  Patient ID: Paige Andrews, female    DOB: 08/23/1956  Age: 61 y.o. MRN: [id number]  CC: Abdominal Pain (severe pain- right side); Nausea; and vomited   HPI Paige Andrews is a 61 year old female with a history of IBS, previous alcohol abuse, pancreatitis who presents with abdominal pain which starts in the epigastrium and radiates to her RUQ and LUQ and sometimes up her chest with associated nausea, vomiting and Diarrhea.  She was hospitalized at [facility] in 01/2017 for same symptoms and MRI abdomen had revealed Focal scarring/ atrophy involving the mid/ distal pancreatic tail,with adherence of adjacent structures, including the stomach.While this may reflect sequela of prior pancreatitis in the appropriate clinical setting, underlying pancreatic mass is notexcluded. EUS is suggested for further evaluation. She missed her appointment with GI scheduled for 02/20/17 as she did not have the $85 required for that visit.  Her symptoms improved after discharge but worsens over the last week and is better today. At other times she has alternating diarrhea and constipation.  Requests refill of her chronic meds and has been unable to obtain them due to financial constraints - she has been informed the pharmacy will work with her regarding payment.   Past Medical History:  Diagnosis Date  . Anxiety   . Bipolar 1 disorder (HCC)   . Celiac disease   . Depression   . Hypertension   . IBS (irritable bowel syndrome)     Past Surgical History:  Procedure Laterality Date  . carpel tunnel Right   . CESAREAN SECTION    . SHOULDER SURGERY Right     Allergies  Allergen Reactions  . Morphine And Related Anxiety    Patient states, " it makes me  feel like I am crawling out of my skin"     Outpatient Medications Prior to Visit  Medication Sig Dispense Refill  . pantoprazole (PROTONIX) 40 MG tablet Take 1 tablet (40 mg total) by mouth 2 (two) times daily. 60 tablet 0  .  bisacodyl (DULCOLAX) 10 MG suppository Place 1 suppository (10 mg total) rectally daily as needed for mild constipation or moderate constipation. (Patient not taking: Reported on 01/25/2017) 30 suppository 0  . naproxen (NAPROSYN) 250 MG tablet Take 1 tablet (250 mg total) by mouth 2 (two) times daily with a meal. (Patient not taking: Reported on 01/25/2017) 60 tablet 0  . ondansetron (ZOFRAN) 4 MG tablet Take 1 tablet (4 mg total) by mouth every 8 (eight) hours as needed for nausea or vomiting. (Patient not taking: Reported on 01/25/2017) 20 tablet 0  . polyethylene glycol (MIRALAX) packet Take 17 g by mouth 2 (two) times daily. (Patient not taking: Reported on 01/25/2017) 100 each 0  . promethazine (PHENERGAN) 12.5 MG tablet Take 1 tablet (12.5 mg total) by mouth every 6 (six) hours as needed for nausea or vomiting. (Patient not taking: Reported on 03/30/2017) 30 tablet 0  . traZODone (DESYREL) 50 MG tablet 1-1/12 at bedtime prn (Patient not taking: Reported on 03/30/2017) 45 tablet 3   No facility-administered medications prior to visit.     ROS Review of Systems  Constitutional: Negative for activity change, appetite change and fatigue.  HENT: Negative for congestion, sinus pressure and sore throat.   Eyes: Negative for visual disturbance.  Respiratory: Negative for cough, chest tightness, shortness of breath and wheezing.   Cardiovascular: Negative for chest pain and palpitations.  Gastrointestinal: Positive for abdominal pain, constipation, diarrhea, nausea and vomiting. Negative for abdominal  distention.  Endocrine: Negative for polydipsia.  Genitourinary: Negative for dysuria and frequency.  Musculoskeletal: Negative for arthralgias and back pain.  Skin: Negative for rash.  Neurological: Negative for tremors, light-headedness and numbness.  Hematological: Does not bruise/bleed easily.  Psychiatric/Behavioral: Positive for sleep disturbance. Negative for agitation and behavioral problems.     Objective:  BP 130/86 (BP Location: Right Arm, Patient Position: Sitting, Cuff Size: Small)   Pulse 83   Temp 98.3 F (36.8 C) (Oral)   Ht  (1.626 m)   Wt 189 lb 6.4 oz (85.9 kg)   SpO2 96%   BMI 32.51 kg/m   BP/Weight 03/30/2017 01/25/2017 01/23/2017  Systolic BP 130 126 131  Diastolic BP 86 89 83  Wt. (Lbs) 189.4 195 -  BMI 32.51 33.47 -      Physical Exam  Constitutional: She is oriented to person, place, and time. She appears well-developed and well-nourished.  Cardiovascular: Normal rate, normal heart sounds and intact distal pulses.   No murmur heard. Pulmonary/Chest: Effort normal and breath sounds normal. She has no wheezes. She has no rales. She exhibits no tenderness.  Abdominal: Soft. Bowel sounds are normal. She exhibits no distension and no mass. There is tenderness (in epigastrium , LUQ, RUQ).  Musculoskeletal: Normal range of motion.  Neurological: She is alert and oriented to person, place, and time.  Skin: Skin is warm and dry.  Psychiatric: She has a normal mood and affect.     CLINICAL DATA:  Worsening abdominal pain x2 weeks, nausea/vomiting, back pain. Abnormal pancreas on CT, evaluate for underlying pancreatic neoplasm.  EXAM: MRI ABDOMEN WITHOUT AND WITH CONTRAST  TECHNIQUE: Multiplanar multisequence MR imaging of the abdomen was performed both before and after the administration of intravenous contrast.  CONTRAST:  19mL MULTIHANCE GADOBENATE DIMEGLUMINE 529 MG/ML IV SOLN  COMPARISON:  Unenhanced CT abdomen/ pelvis dated 01/21/2017  FINDINGS: Lower chest: Lung bases are clear.  Hepatobiliary: Liver is within normal limits. No suspicious/enhancing hepatic lesions.  Gallbladder is unremarkable. No intrahepatic or extrahepatic ductal dilatation  Pancreas: Pancreatic head/ uncinate process and proximal pancreatic body are within normal limits.  However, there is abrupt cut off of the pancreatic tail with surrounding  scarring and adherence of adjacent structures, including the stomach (series 1203/ image 48). It is unclear whether this reflects an underlying hypoenhancing mass with fibrosis or is simply the sequela of prior inflammation/ pancreatitis.  No superimposed acute inflammation is evident. No peripancreatic fluid/ pseudocyst.  No associated pancreatic ductal dilatation.  Spleen:  Within normal limits.  Adrenals/Urinary Tract:  Adrenal glands are within normal limits.  Kidneys are within normal limits.  No hydronephrosis.  Stomach/Bowel: Mild angulation of the mid gastric body which is adherent to the pancreatic tail. Stomach is otherwise within normal limits.  Visualized bowel is unremarkable.  Vascular/Lymphatic:  No evidence of abdominal aortic aneurysm.  No suspicious abdominal lymphadenopathy.  Small peripancreatic lymph nodes measuring up to 7 mm short axis in the left upper abdomen (Series 5/ image 21).  Other:  No abdominal ascites.  Musculoskeletal: No focal osseous lesions.  IMPRESSION: Focal scarring/ atrophy involving the mid/ distal pancreatic tail, with adherence of adjacent structures, including the stomach.  While this may reflect sequela of prior pancreatitis in the appropriate clinical setting, underlying pancreatic mass is not excluded. EUS is suggested for further evaluation.  Adjacent small peripancreatic lymph nodes measuring up to 7 mm short axis, nonspecific.   Electronically Signed   By: Roselie Awkward.D.  On: 01/21/2017 12:41  Assessment & Plan:   1. Insomnia, unspecified type Uncontrolled as she has been out of Trazodone - traZODone (DESYREL) 50 MG tablet; Take 1 tablet (50 mg total) by mouth at bedtime.  Dispense: 30 tablet; Refill: 3  2. Irritable bowel syndrome with diarrhea Uncontrolled Refusing Bentyl, would like to stay on Miralax - polyethylene glycol (MIRALAX) packet; Take 17 g by mouth 2 (two) times daily.   Dispense: 60 each; Refill: 2  3. Pancreatic abnormality Needs EUS Unable to see GI due to cost Advised to apply for [facility] financial discount to facilitate referral - to notify us once she is approved so referral can be placed  4. Gastroesophageal reflux disease with esophagitis - pantoprazole (PROTONIX) 40 MG tablet; Take 1 tablet (40 mg total) by mouth 2 (two) times daily.  Dispense: 60 tablet; Refill: 2 - promethazine (PHENERGAN) 12.5 MG tablet; Take 1 tablet (12.5 mg total) by mouth every 8 (eight) hours as needed for nausea or vomiting.  Dispense: 30 tablet; Refill: 1 - H. pylori breath test   Meds ordered this encounter  Medications  . traZODone (DESYREL) 50 MG tablet    Sig: Take 1 tablet (50 mg total) by mouth at bedtime.    Dispense:  30 tablet    Refill:  3  . pantoprazole (PROTONIX) 40 MG tablet    Sig: Take 1 tablet (40 mg total) by mouth 2 (two) times daily.    Dispense:  60 tablet    Refill:  2  . promethazine (PHENERGAN) 12.5 MG tablet    Sig: Take 1 tablet (12.5 mg total) by mouth every 8 (eight) hours as needed for nausea or vomiting.    Dispense:  30 tablet    Refill:  1  . polyethylene glycol (MIRALAX) packet    Sig: Take 17 g by mouth 2 (two) times daily.    Dispense:  60 each    Refill:  2    Follow-up: 3 weeks follow up on symptoms  Jaclyn Shaggy MD

## 2017-03-30 NOTE — Patient Instructions (Signed)
Food Choices for Gastroesophageal Reflux Disease, Adult When you have gastroesophageal reflux disease (GERD), the foods you eat and your eating habits are very important. Choosing the right foods can help ease your discomfort. What guidelines do I need to follow?  Choose fruits, vegetables, whole grains, and low-fat dairy products.  Choose low-fat meat, fish, and poultry.  Limit fats such as oils, salad dressings, butter, nuts, and avocado.  Keep a food diary. This helps you identify foods that cause symptoms.  Avoid foods that cause symptoms. These may be different for everyone.  Eat small meals often instead of 3 large meals a day.  Eat your meals slowly, in a place where you are relaxed.  Limit fried foods.  Cook foods using methods other than frying.  Avoid drinking alcohol.  Avoid drinking large amounts of liquids with your meals.  Avoid bending over or lying down until 2-3 hours after eating. What foods are not recommended? These are some foods and drinks that may make your symptoms worse: Vegetables  Tomatoes. Tomato juice. Tomato and spaghetti sauce. Chili peppers. Onion and garlic. Horseradish. Fruits  Oranges, grapefruit, and lemon (fruit and juice). Meats  High-fat meats, fish, and poultry. This includes hot dogs, ribs, ham, sausage, salami, and bacon. Dairy  Whole milk and chocolate milk. Sour cream. Cream. Butter. Ice cream. Cream cheese. Drinks  Coffee and tea. Bubbly (carbonated) drinks or energy drinks. Condiments  Hot sauce. Barbecue sauce. Sweets/Desserts  Chocolate and cocoa. Donuts. Peppermint and spearmint. Fats and Oils  High-fat foods. This includes French fries and potato chips. Other  Vinegar. Strong spices. This includes black pepper, white pepper, red pepper, cayenne, curry powder, cloves, ginger, and chili powder. The items listed above may not be a complete list of foods and drinks to avoid. Contact your dietitian for more information.    This information is not intended to replace advice given to you by your health care provider. Make sure you discuss any questions you have with your health care provider. Document Released: 05/28/2012 Document Revised: 05/04/2016 Document Reviewed: 10/01/2013 Elsevier Interactive Patient Education  2017 Elsevier Inc.  

## 2017-04-02 LAB — H.PYLORI BREATH TEST (REFLEX): H. PYLORI BREATH TEST: NEGATIVE

## 2017-04-02 LAB — H. PYLORI BREATH TEST

## 2017-04-03 ENCOUNTER — Telehealth: Payer: Self-pay

## 2017-04-03 NOTE — Telephone Encounter (Signed)
Writer spoke with patient and informed her that her lab results were normal per Dr. Venetia Night.  Patient stated understanding.

## 2017-04-03 NOTE — Telephone Encounter (Signed)
-----   Message from Enobong Amao, MD sent at 04/02/2017  4:44 PM EDT ----- Please inform the patient that labs are normal. Thank you. 

## 2017-04-25 ENCOUNTER — Ambulatory Visit: Payer: Self-pay | Admitting: Family Medicine

## 2017-05-29 ENCOUNTER — Ambulatory Visit: Payer: Self-pay | Admitting: Family Medicine

## 2017-06-01 ENCOUNTER — Ambulatory Visit: Payer: Self-pay | Admitting: Family Medicine

## 2017-06-08 ENCOUNTER — Encounter: Payer: Self-pay | Admitting: Family Medicine

## 2017-06-08 ENCOUNTER — Ambulatory Visit: Payer: Self-pay | Admitting: Licensed Clinical Social Worker

## 2017-06-08 ENCOUNTER — Other Ambulatory Visit: Payer: Self-pay | Admitting: Family Medicine

## 2017-06-08 ENCOUNTER — Ambulatory Visit: Payer: Self-pay | Attending: Family Medicine | Admitting: Family Medicine

## 2017-06-08 DIAGNOSIS — M549 Dorsalgia, unspecified: Secondary | ICD-10-CM | POA: Insufficient documentation

## 2017-06-08 DIAGNOSIS — F32A Depression, unspecified: Secondary | ICD-10-CM

## 2017-06-08 DIAGNOSIS — K2100 Gastro-esophageal reflux disease with esophagitis, without bleeding: Secondary | ICD-10-CM

## 2017-06-08 DIAGNOSIS — F329 Major depressive disorder, single episode, unspecified: Secondary | ICD-10-CM

## 2017-06-08 DIAGNOSIS — G8929 Other chronic pain: Secondary | ICD-10-CM

## 2017-06-08 DIAGNOSIS — M25511 Pain in right shoulder: Secondary | ICD-10-CM

## 2017-06-08 DIAGNOSIS — F331 Major depressive disorder, recurrent, moderate: Secondary | ICD-10-CM

## 2017-06-08 DIAGNOSIS — G4700 Insomnia, unspecified: Secondary | ICD-10-CM | POA: Insufficient documentation

## 2017-06-08 DIAGNOSIS — F419 Anxiety disorder, unspecified: Principal | ICD-10-CM

## 2017-06-08 DIAGNOSIS — G4709 Other insomnia: Secondary | ICD-10-CM

## 2017-06-08 DIAGNOSIS — M5441 Lumbago with sciatica, right side: Secondary | ICD-10-CM

## 2017-06-08 DIAGNOSIS — M25519 Pain in unspecified shoulder: Secondary | ICD-10-CM | POA: Insufficient documentation

## 2017-06-08 MED ORDER — GABAPENTIN 300 MG PO CAPS: 300.0000 mg | ORAL_CAPSULE | Freq: Two times a day (BID) | ORAL | 3 refills | Status: DC

## 2017-06-08 MED ORDER — CYCLOBENZAPRINE HCL 10 MG PO TABS: 10.0000 mg | ORAL_TABLET | Freq: Two times a day (BID) | ORAL | 3 refills | Status: DC | PRN

## 2017-06-08 MED ORDER — DULOXETINE HCL 60 MG PO CPEP: 60.0000 mg | ORAL_CAPSULE | Freq: Every day | ORAL | 3 refills | Status: DC

## 2017-06-08 MED FILL — GABAPENTIN 300 MG CAPSULE: 300 | 30 days supply | Qty: 60 | Fill #0

## 2017-06-08 MED FILL — ?DULOXETINE HCL DR 60 MG CA: 60 MG | 30 days supply | Qty: 30 | Fill #0

## 2017-06-08 MED FILL — PROMETHAZINE 12.5 MG TABLET: 12.5 | 10 days supply | Qty: 30 | Fill #1

## 2017-06-08 MED FILL — traZODone HCL 50 MG TABS: 50 | 30 days supply | Qty: 30 | Fill #1

## 2017-06-08 MED FILL — CYCLOBENZAPRINE 10 MG TAB: 10 | 30 days supply | Qty: 60 | Fill #0

## 2017-06-08 MED FILL — ?PANTOPRAZOLE SOD DR 40MG: 40 MG | 30 days supply | Qty: 60 | Fill #1

## 2017-06-08 NOTE — Progress Notes (Signed)
Subjective:  Patient ID: Paige Andrews, female    DOB: November 25, 1956  Age: 61 y.o. MRN: [id number]  CC: Follow-up (GI sx's, N/V abdominal pain ); Headache; and Back Pain   HPi Paige Andrews is a 61 year old female with a history of IBS, previous alcohol abuse, pancreatitis who presents to the clinic for follow-up of abdominal pain which she states is somewhat better ever since she has been on Protonix. H. pylori came back negative.   She was hospitalized at [facility] in 01/2017 for same symptoms and MRI abdomen had revealed Focal scarring/ atrophy involving the mid/ distal pancreatic tail,with adherence of adjacent structures, including the stomach.While this may reflect sequela of prior pancreatitis in the appropriate clinical setting, underlying pancreatic mass is notexcluded. EUS is suggested for further evaluation. She has been unable to see GI due to financial constraints.  She complains of low back pain which radiates down her right leg and received epidural steroid injections in the past with some relief. She also has right shoulder pain which is intermittent and worse on reaching over her head. The pain makes it difficult to sleep despite taking trazodone and she also notes a crawling sensation in her legs  She complains of eating a lot due to being depressed. She has to care for her sick son who suffered traumatic brain injury in the military and has to drive to [place] several times a week to care for him. This has worsened her back pain. Pain is absent at this time. Denies suicidal ideation or intents  Past Medical History:  Diagnosis Date  . Anxiety   . Bipolar 1 disorder (HCC)   . Celiac disease   . Depression   . Hypertension   . IBS (irritable bowel syndrome)     Past Surgical History:  Procedure Laterality Date  . carpel tunnel Right   . CESAREAN SECTION    . SHOULDER SURGERY Right     Allergies  Allergen Reactions  . Morphine And Related Anxiety   Patient states, " it makes me  feel like I am crawling out of my skin"     Outpatient Medications Prior to Visit  Medication Sig Dispense Refill  . bisacodyl (DULCOLAX) 10 MG suppository Place 1 suppository (10 mg total) rectally daily as needed for mild constipation or moderate constipation. 30 suppository 0  . pantoprazole (PROTONIX) 40 MG tablet Take 1 tablet (40 mg total) by mouth 2 (two) times daily. 60 tablet 2  . polyethylene glycol (MIRALAX) packet Take 17 g by mouth 2 (two) times daily. 60 each 2  . promethazine (PHENERGAN) 12.5 MG tablet Take 1 tablet (12.5 mg total) by mouth every 8 (eight) hours as needed for nausea or vomiting. 30 tablet 1  . traZODone (DESYREL) 50 MG tablet Take 1 tablet (50 mg total) by mouth at bedtime. 30 tablet 3  . naproxen (NAPROSYN) 250 MG tablet Take 1 tablet (250 mg total) by mouth 2 (two) times daily with a meal. (Patient not taking: Reported on 06/08/2017) 60 tablet 0  . ondansetron (ZOFRAN) 4 MG tablet Take 1 tablet (4 mg total) by mouth every 8 (eight) hours as needed for nausea or vomiting. (Patient not taking: Reported on 01/25/2017) 20 tablet 0   No facility-administered medications prior to visit.     ROS Review of Systems  Constitutional: Negative for activity change, appetite change and fatigue.  HENT: Negative for congestion, sinus pressure and sore throat.   Eyes: Negative for visual disturbance.  Respiratory: Negative for cough, chest tightness, shortness of breath and wheezing.   Cardiovascular: Negative for chest pain and palpitations.  Gastrointestinal: Negative for abdominal distention, abdominal pain and constipation.  Endocrine: Negative for polydipsia.  Genitourinary: Negative for dysuria and frequency.  Musculoskeletal: Positive for back pain. Negative for arthralgias.  Skin: Negative for rash.  Neurological: Negative for tremors, light-headedness and numbness.  Hematological: Does not bruise/bleed easily.    Psychiatric/Behavioral: Positive for dysphoric mood and sleep disturbance. Negative for agitation and behavioral problems.    Objective:  BP (!) 140/101 (BP Location: Left Arm, Patient Position: Sitting, Cuff Size: Normal)   Pulse (!) 103   Temp 98.2 F (36.8 C) (Oral)   Resp 20   Wt 188 lb (85.3 kg)   LMP 12/11/2014 (Approximate)   SpO2 96%   BMI 32.27 kg/m   BP/Weight 06/08/2017 03/30/2017 01/25/2017  Systolic BP 140 130 126  Diastolic BP 101 86 89  Wt. (Lbs) 188 189.4 195  BMI 32.27 32.51 33.47      Physical Exam  Constitutional: She is oriented to person, place, and time. She appears well-developed and well-nourished.  Cardiovascular: Normal rate, normal heart sounds and intact distal pulses.   No murmur heard. Pulmonary/Chest: Effort normal and breath sounds normal. She has no wheezes. She has no rales. She exhibits no tenderness.  Abdominal: Soft. Bowel sounds are normal. She exhibits no distension and no mass. There is no tenderness.  Musculoskeletal: Normal range of motion. She exhibits no edema or tenderness.  Negative straight leg raise bilaterally  Neurological: She is alert and oriented to person, place, and time.  Psychiatric:  Dysphoric mood     Assessment & Plan:   1. Moderate episode of recurrent major depressive disorder (HCC) Exacerbated by underlying psychosocial stressors, not in crisis LCSW called in for counseling - DULoxetine (CYMBALTA) 60 MG capsule; Take 1 capsule (60 mg total) by mouth daily.  Dispense: 30 capsule; Refill: 3  2. Chronic right-sided low back pain with right-sided sciatica Status post epidural spinal injection in the past - gabapentin (NEURONTIN) 300 MG capsule; Take 1 capsule (300 mg total) by mouth 2 (two) times daily.  Dispense: 60 capsule; Refill: 3 - cyclobenzaprine (FLEXERIL) 10 MG tablet; Take 1 tablet (10 mg total) by mouth 2 (two) times daily as needed for muscle spasms.  Dispense: 60 tablet; Refill: 3  3. Chronic  right shoulder pain Cannot exclude underlying osteoarthritis May benefit from OTC NSAIDs  4. Other insomnia Currently on trazodone Hopefully improvement of pain control will bring about improvement   Meds ordered this encounter  Medications  . gabapentin (NEURONTIN) 300 MG capsule    Sig: Take 1 capsule (300 mg total) by mouth 2 (two) times daily.    Dispense:  60 capsule    Refill:  3  . cyclobenzaprine (FLEXERIL) 10 MG tablet    Sig: Take 1 tablet (10 mg total) by mouth 2 (two) times daily as needed for muscle spasms.    Dispense:  60 tablet    Refill:  3  . DULoxetine (CYMBALTA) 60 MG capsule    Sig: Take 1 capsule (60 mg total) by mouth daily.    Dispense:  30 capsule    Refill:  3    Follow-up: Return in about 3 months (around 09/08/2017) for For follow-up on depression and back pain.   This note has been created with Education officer, environmental. Any transcriptional errors are unintentional.     Paige Andrews  Venetia NightAmao MD

## 2017-06-08 NOTE — BH Specialist Note (Signed)
Integrated Behavioral Health Follow Up Visit  MRN: [id number] Name: Paige Andrews   Session Start time: 4:30 PM Session End time: 5:00 PM Total time: 30 minutes Number of Integrated Behavioral Health Clinician visits: 2/10  Type of Service: Integrated Behavioral Health- Individual/Family Interpretor:No. Interpretor Name and Language: N/A   Warm Hand Off Completed.       SUBJECTIVE: Paige Andrews is a 61 y.o. female accompanied by patient. Patient was referred by Dr. Venetia NightAmao for depression and anxiety. Patient reports the following symptoms/concerns: difficulty focusing, frequent panic attacks, racing thoughts, difficulty sleeping, and low energy Duration of problem: Ongoing Pt reported that she was diagnosed with Bipolar, Depression, and Anxiety at age 61; Severity of problem: severe  OBJECTIVE: Mood: Anxious and Depressed and Affect: Depressed Risk of harm to self or others: Suicidal ideation No plan to harm self or others   LIFE CONTEXT: Family and Social: Pt resides with long time boyfriend. She [place] adult [place] and [place]: Pt is unemployed. She was denied disability and social security benefits. She receives food stamps ($190) Self-Care: Pt has hx of substance use (alcohol) She has been sober since 2012.  Life Changes: Pt is experiencing financial strain. She assists in caring for son with TBI and assisting daughter with selling/moving out of the country  GOALS ADDRESSED: Patient will reduce symptoms of: anxiety and depression and increase knowledge and/or ability of: coping skills and also: Increase adequate support systems for patient/family  INTERVENTIONS: Solution-Focused Strategies, Mindfulness or Relaxation Training, Supportive Counseling and Link to [facility] Standardized Assessments completed: PHQ 2&9 with C-SSRS  ASSESSMENT: Patient currently experiencing depression and anxiety triggered by  caregiver stress and financial strain. She reports difficulty focusing, frequent panic attacks, racing thoughts, difficulty sleeping, and low energy. Patient may benefit from psychotherapy. She participates in medication management through PCP. LCSWA discussed grounding interventions with pt. Resources for utility assistance and labor were provided.    PLAN: 1. Follow up with behavioral health clinician on : Pt was encouraged tocontact LCSWA if symptoms worsen or fail to improveto [facility] 2. Behavioral recommendations: LCSWA recommends that pt apply healthy coping skills discussed and utilize community resources. Pt is encouraged to schedule follow up appointment with LCSWA 3. Referral(s): Community Resources:  Finances 4. "From scale of 1-10, how likely are you to follow plan?": 7/10  Bridgett LarssonJasmine D Cuauhtemoc Huegel, LCSW 06/12/17 10:37 AM

## 2017-06-08 NOTE — Patient Instructions (Signed)

## 2017-07-09 MED FILL — ?DULOXETINE HCL DR 60 MG CA: 60 MG | 30 days supply | Qty: 30 | Fill #1

## 2017-07-09 MED FILL — traZODone HCL 50 MG TABS: 50 | 30 days supply | Qty: 30 | Fill #2

## 2017-07-09 MED FILL — CYCLOBENZAPRINE 10 MG TAB: 10 | 30 days supply | Qty: 60 | Fill #1

## 2017-08-29 ENCOUNTER — Other Ambulatory Visit: Payer: Self-pay | Admitting: Family Medicine

## 2017-08-29 DIAGNOSIS — G4700 Insomnia, unspecified: Secondary | ICD-10-CM

## 2017-08-29 MED FILL — ?DULOXETINE HCL DR 60 MG CA: 60 MG | 30 days supply | Qty: 30 | Fill #2

## 2017-08-29 MED FILL — traZODone HCL 50 MG TABS: 50 | 30 days supply | Qty: 30 | Fill #3

## 2017-08-29 MED FILL — CYCLOBENZAPRINE 10 MG TAB: 10 | 30 days supply | Qty: 60 | Fill #2

## 2017-09-06 ENCOUNTER — Ambulatory Visit: Payer: Self-pay | Attending: Family Medicine | Admitting: Family Medicine

## 2017-09-06 ENCOUNTER — Encounter: Payer: Self-pay | Admitting: Family Medicine

## 2017-09-06 VITALS — BP 144/99 | HR 102 | Temp 97.5°F | Ht 64.0 in | Wt 190.6 lb

## 2017-09-06 DIAGNOSIS — I10 Essential (primary) hypertension: Secondary | ICD-10-CM | POA: Insufficient documentation

## 2017-09-06 DIAGNOSIS — Z79899 Other long term (current) drug therapy: Secondary | ICD-10-CM | POA: Insufficient documentation

## 2017-09-06 DIAGNOSIS — M5441 Lumbago with sciatica, right side: Secondary | ICD-10-CM | POA: Insufficient documentation

## 2017-09-06 DIAGNOSIS — F319 Bipolar disorder, unspecified: Secondary | ICD-10-CM | POA: Insufficient documentation

## 2017-09-06 DIAGNOSIS — K900 Celiac disease: Secondary | ICD-10-CM | POA: Insufficient documentation

## 2017-09-06 DIAGNOSIS — K589 Irritable bowel syndrome without diarrhea: Secondary | ICD-10-CM | POA: Insufficient documentation

## 2017-09-06 DIAGNOSIS — F329 Major depressive disorder, single episode, unspecified: Secondary | ICD-10-CM

## 2017-09-06 DIAGNOSIS — Z23 Encounter for immunization: Secondary | ICD-10-CM | POA: Insufficient documentation

## 2017-09-06 DIAGNOSIS — K2100 Gastro-esophageal reflux disease with esophagitis, without bleeding: Secondary | ICD-10-CM

## 2017-09-06 DIAGNOSIS — K859 Acute pancreatitis without necrosis or infection, unspecified: Secondary | ICD-10-CM | POA: Insufficient documentation

## 2017-09-06 DIAGNOSIS — Z9119 Patient's noncompliance with other medical treatment and regimen: Secondary | ICD-10-CM | POA: Insufficient documentation

## 2017-09-06 DIAGNOSIS — G4700 Insomnia, unspecified: Secondary | ICD-10-CM | POA: Insufficient documentation

## 2017-09-06 DIAGNOSIS — G8929 Other chronic pain: Secondary | ICD-10-CM

## 2017-09-06 DIAGNOSIS — J45909 Unspecified asthma, uncomplicated: Secondary | ICD-10-CM | POA: Insufficient documentation

## 2017-09-06 DIAGNOSIS — R945 Abnormal results of liver function studies: Secondary | ICD-10-CM

## 2017-09-06 DIAGNOSIS — R7989 Other specified abnormal findings of blood chemistry: Secondary | ICD-10-CM | POA: Insufficient documentation

## 2017-09-06 DIAGNOSIS — M25561 Pain in right knee: Secondary | ICD-10-CM | POA: Insufficient documentation

## 2017-09-06 DIAGNOSIS — K219 Gastro-esophageal reflux disease without esophagitis: Secondary | ICD-10-CM | POA: Insufficient documentation

## 2017-09-06 DIAGNOSIS — F419 Anxiety disorder, unspecified: Secondary | ICD-10-CM | POA: Insufficient documentation

## 2017-09-06 DIAGNOSIS — J4520 Mild intermittent asthma, uncomplicated: Secondary | ICD-10-CM | POA: Insufficient documentation

## 2017-09-06 MED ORDER — PANTOPRAZOLE SODIUM 40 MG PO TBEC: 40.0000 mg | DELAYED_RELEASE_TABLET | Freq: Every day | ORAL | 3 refills | Status: DC

## 2017-09-06 MED ORDER — ALBUTEROL SULFATE HFA 108 (90 BASE) MCG/ACT IN AERS: 2.0000 | INHALATION_SPRAY | Freq: Four times a day (QID) | RESPIRATORY_TRACT | 2 refills | Status: DC | PRN

## 2017-09-06 MED ORDER — HYDROXYZINE HCL 25 MG PO TABS: 25.0000 mg | ORAL_TABLET | Freq: Three times a day (TID) | ORAL | 3 refills | Status: DC | PRN

## 2017-09-06 MED ORDER — GABAPENTIN 300 MG PO CAPS: 300.0000 mg | ORAL_CAPSULE | Freq: Two times a day (BID) | ORAL | 3 refills | Status: DC

## 2017-09-06 MED ORDER — DULOXETINE HCL 60 MG PO CPEP: 60.0000 mg | ORAL_CAPSULE | Freq: Every day | ORAL | 3 refills | Status: DC

## 2017-09-06 MED ORDER — MELOXICAM 7.5 MG PO TABS: 7.5000 mg | ORAL_TABLET | Freq: Every day | ORAL | 1 refills | Status: DC

## 2017-09-06 MED ORDER — TRAZODONE HCL 100 MG PO TABS: 100.0000 mg | ORAL_TABLET | Freq: Every day | ORAL | 3 refills | Status: DC

## 2017-09-06 MED ORDER — BACLOFEN 10 MG PO TABS: 10.0000 mg | ORAL_TABLET | Freq: Three times a day (TID) | ORAL | 3 refills | Status: DC

## 2017-09-06 MED FILL — !VENTOLIN HFA INHALER: 108 (90 BAS | 25 days supply | Qty: 18 | Fill #0

## 2017-09-06 MED FILL — hydrOXYzine HCL 25 MG TABS: 25 | 20 days supply | Qty: 60 | Fill #0

## 2017-09-06 MED FILL — traZODone HCL 100 MG TABS: 100 | 30 days supply | Qty: 30 | Fill #0

## 2017-09-06 MED FILL — BACLOFEN 10 MG TABLET: 10 | 30 days supply | Qty: 90 | Fill #0

## 2017-09-06 MED FILL — MELOXICAM 7.5 MG TABLET: 7.5 | 30 days supply | Qty: 30 | Fill #0

## 2017-09-06 MED FILL — ?PANTOPRAZOLE SOD DR 40MG: 40 MG | 30 days supply | Qty: 30 | Fill #0

## 2017-09-06 NOTE — Progress Notes (Signed)
Subjective:  Patient ID: Paige Andrews, female    DOB: Apr 13, 1956  Age: 61 y.o. MRN: [id number]  CC: Depression and Back Pain   HPI Paige Andrews  is a 61 year old female with a history of IBS, previous alcohol abuse, pancreatitis, GERD, Bipolar disorder, anxiety and depression who presents for a follow up visit.  She complains depression is uncontrolled but she has not been compliant with Cymbalta due to lack of finances to make it to the pharmacy and also to pay for for it. Informs me the pharmacy said this would be her last refill for free. Complains of feeling suffocated while sleeping. Insomnia is uncontrolled on current dose of Trazodone; states she did better on Ambien in the past. Endorses snoring and daytime somnolence and headaches.  Right knee has been painful for the last few weeks and she has chronic low back pain radiating to the right lower extremity which is severe. She previously received Percocet in [place] which helped. Was also on Baclofen in the past which helped her back and her insomnia. Denies numbness in extremities or loss of sphincteric function  Would like to go back on a rescue inhaler for her Asthma and also complains of generalized pruritus. Denies chest pain or SOB.  Past Medical History:  Diagnosis Date  . Anxiety   . Bipolar 1 disorder ([place])   . Celiac disease   . Depression   . Hypertension   . IBS (irritable bowel syndrome)     Past Surgical History:  Procedure Laterality Date  . carpel tunnel Right   . CESAREAN SECTION    . SHOULDER SURGERY Right     Allergies  Allergen Reactions  . Morphine And Related Anxiety    Patient states, " it makes me  feel like I am crawling out of my skin"    Outpatient Medications Prior to Visit  Medication Sig Dispense Refill  . polyethylene glycol (MIRALAX) packet Take 17 g by mouth 2 (two) times daily. 60 each 2  . promethazine (PHENERGAN) 12.5 MG tablet Take 1 tablet (12.5 mg total) by mouth  every 8 (eight) hours as needed for nausea or vomiting. 30 tablet 1  . cyclobenzaprine (FLEXERIL) 10 MG tablet Take 1 tablet (10 mg total) by mouth 2 (two) times daily as needed for muscle spasms. 60 tablet 3  . DULoxetine (CYMBALTA) 60 MG capsule Take 1 capsule (60 mg total) by mouth daily. 30 capsule 3  . pantoprazole (PROTONIX) 40 MG tablet Take 1 tablet (40 mg total) by mouth 2 (two) times daily. 60 tablet 2  . traZODone (DESYREL) 50 MG tablet Take 1 tablet (50 mg total) by mouth at bedtime. 30 tablet 3  . bisacodyl (DULCOLAX) 10 MG suppository Place 1 suppository (10 mg total) rectally daily as needed for mild constipation or moderate constipation. (Patient not taking: Reported on 09/06/2017) 30 suppository 0  . gabapentin (NEURONTIN) 300 MG capsule Take 1 capsule (300 mg total) by mouth 2 (two) times daily. (Patient not taking: Reported on 09/06/2017) 60 capsule 3   No facility-administered medications prior to visit.     ROS Review of Systems  Constitutional: Negative for activity change, appetite change and fatigue.  HENT: Negative for congestion, sinus pressure and sore throat.   Eyes: Negative for visual disturbance.  Respiratory: Negative for cough, chest tightness, shortness of breath and wheezing.   Cardiovascular: Negative for chest pain and palpitations.  Gastrointestinal: Negative for abdominal distention, abdominal pain and constipation.  Endocrine: Negative for  polydipsia.  Genitourinary: Negative for dysuria and frequency.  Musculoskeletal:       See hpi  Skin: Negative for rash.  Neurological: Negative for tremors, light-headedness and numbness.  Hematological: Does not bruise/bleed easily.  Psychiatric/Behavioral: Positive for dysphoric mood and sleep disturbance. Negative for agitation and behavioral problems.    Objective:  BP (!) 144/99   Pulse (!) 102   Temp (!) 97.5 F (36.4 C) (Oral)   Ht 5' 4"  (1.626 m)   Wt 190 lb 9.6 oz (86.5 kg)   SpO2 96%   BMI 32.72  kg/m   BP/Weight 09/06/2017 06/08/2017 1/61/0960  Systolic BP 454 098 119  Diastolic BP 99 147 86  Wt. (Lbs) 190.6 188 189.4  BMI 32.72 32.27 32.51      Physical Exam  Constitutional: She is oriented to person, place, and time. She appears well-developed and well-nourished.  Cardiovascular: Normal heart sounds and intact distal pulses.  Tachycardia present.   No murmur heard. Pulmonary/Chest: Effort normal and breath sounds normal. She has no wheezes. She has no rales. She exhibits no tenderness.  Abdominal: Soft. Bowel sounds are normal. She exhibits no distension and no mass. There is no tenderness.  Musculoskeletal: Normal range of motion. She exhibits no edema or tenderness (No tenderness on palpation of the knee; crepitus on left knee flexion).  No lumbar spine tenderness on exam, negative straight leg raise  Neurological: She is alert and oriented to person, place, and time.  Psychiatric: Her speech is rapid and/or pressured. She exhibits a depressed mood.      Depression screen [facility] 2/9 09/06/2017 06/08/2017 03/30/2017  Decreased Interest 3 3 3   Down, Depressed, Hopeless 3 3 3   PHQ - 2 Score 6 6 6   Altered sleeping 3 3 3   Tired, decreased energy 3 3 3   Change in appetite 3 3 3   Feeling bad or failure about yourself  3 - 3  Trouble concentrating 3 3 3   Moving slowly or fidgety/restless 3 3 3   Suicidal thoughts 3 1 1   PHQ-9 Score 27 22 25     Assessment & Plan:   1. Anxiety and depression Uncontrolled due to non compliance and underlying stressors Hydroxyzine added which will also help with insomnia and pruritus She had an elevated phq 9 score but is not willing to receive recommended counseling or follow through with the LCSW She will benefit from Psych eval due to underlying Bipolar but she again declines following [facility] despite persuasion  Not in crises at this time but is at a high risk - DULoxetine (CYMBALTA) 60 MG capsule; Take 1 capsule (60 mg total) by  mouth daily.  Dispense: 30 capsule; Refill: 3  2. Need for influenza vaccination - Flu Vaccine QUAD 36+ mos IM  3. Chronic right-sided low back pain with right-sided sciatica Uncontrolled Switch from Flexeril to Baclofen Will discuss need for Tramadol and controlled substances contract at next visit if still uncontrolled - gabapentin (NEURONTIN) 300 MG capsule; Take 1 capsule (300 mg total) by mouth 2 (two) times daily.  Dispense: 60 capsule; Refill: 3 - XR Lumbar Spine 2-3 Views  4. Acute pain of right knee Possibly underlying Osteoarthritis Placed on Meloxicam  5. Mild intermittent asthma without complication No acute flares MDI prn  6. Insomnia, unspecified type Uncontrolled Will need sleep study to evaluate for sleep apnea which she declines due to lack of transportation. Raised dose of Trazodone - traZODone (DESYREL) 100 MG tablet; Take 1 tablet (100 mg total) by  mouth at bedtime.  Dispense: 30 tablet; Refill: 3  7. Gastroesophageal reflux disease with esophagitis Stable - pantoprazole (PROTONIX) 40 MG tablet; Take 1 tablet (40 mg total) by mouth daily.  Dispense: 60 tablet; Refill: 3  8. Elevated LFTs She denies current alcohol consumption Previous h/o alcohol abuse - CMP14+EGFR   Meds ordered this encounter  Medications  . DULoxetine (CYMBALTA) 60 MG capsule    Sig: Take 1 capsule (60 mg total) by mouth daily.    Dispense:  30 capsule    Refill:  3  . baclofen (LIORESAL) 10 MG tablet    Sig: Take 1 tablet (10 mg total) by mouth 3 (three) times daily.    Dispense:  90 each    Refill:  3  . traZODone (DESYREL) 100 MG tablet    Sig: Take 1 tablet (100 mg total) by mouth at bedtime.    Dispense:  30 tablet    Refill:  3    Discontinue previous dose  . pantoprazole (PROTONIX) 40 MG tablet    Sig: Take 1 tablet (40 mg total) by mouth daily.    Dispense:  60 tablet    Refill:  3  . gabapentin (NEURONTIN) 300 MG capsule    Sig: Take 1 capsule (300 mg total) by  mouth 2 (two) times daily.    Dispense:  60 capsule    Refill:  3  . hydrOXYzine (ATARAX/VISTARIL) 25 MG tablet    Sig: Take 1 tablet (25 mg total) by mouth 3 (three) times daily as needed.    Dispense:  60 tablet    Refill:  3  . albuterol (PROVENTIL HFA;VENTOLIN HFA) 108 (90 Base) MCG/ACT inhaler    Sig: Inhale 2 puffs into the lungs every 6 (six) hours as needed for wheezing or shortness of breath.    Dispense:  1 Inhaler    Refill:  2  . meloxicam (MOBIC) 7.5 MG tablet    Sig: Take 1 tablet (7.5 mg total) by mouth daily.    Dispense:  30 tablet    Refill:  1    Follow-up: Return in about 1 month (around 10/06/2017) for Follow-up of depression with Jasmine; 3 months with PCP-depression.   Arnoldo Morale MD

## 2017-09-06 NOTE — Patient Instructions (Signed)

## 2017-09-07 ENCOUNTER — Telehealth: Payer: Self-pay

## 2017-09-07 LAB — CMP14+EGFR
ALK PHOS: 97 IU/L (ref 39–117)
ALT: 38 IU/L — AB (ref 0–32)
AST: 27 IU/L (ref 0–40)
Albumin/Globulin Ratio: 1.7 (ref 1.2–2.2)
Albumin: 4.5 g/dL (ref 3.6–4.8)
BILIRUBIN TOTAL: 0.5 mg/dL (ref 0.0–1.2)
BUN / CREAT RATIO: 17 (ref 12–28)
BUN: 13 mg/dL (ref 8–27)
CO2: 20 mmol/L (ref 20–29)
CREATININE: 0.77 mg/dL (ref 0.57–1.00)
Calcium: 10.2 mg/dL (ref 8.7–10.3)
Chloride: 103 mmol/L (ref 96–106)
GFR calc Af Amer: 96 mL/min/{1.73_m2} (ref >59)
GFR calc non Af Amer: 84 mL/min/{1.73_m2} (ref >59)
GLUCOSE: 242 mg/dL — AB (ref 65–99)
Globulin, Total: 2.7 g/dL (ref 1.5–4.5)
Potassium: 4.3 mmol/L (ref 3.5–5.2)
Sodium: 137 mmol/L (ref 134–144)
Total Protein: 7.2 g/dL (ref 6.0–8.5)

## 2017-09-07 NOTE — Telephone Encounter (Signed)
Pt was called and informed of lab results. 

## 2017-10-05 MED FILL — BACLOFEN 10 MG TABLET: 10 | 30 days supply | Qty: 90 | Fill #1

## 2017-10-05 MED FILL — traZODone HCL 100 MG TABS: 100 | 30 days supply | Qty: 30 | Fill #1

## 2017-10-05 MED FILL — ?DULOXETINE HCL DR 60 MG CA: 60 MG | 30 days supply | Qty: 30 | Fill #3

## 2017-10-05 MED FILL — MELOXICAM 7.5 MG TABLET: 7.5 | 30 days supply | Qty: 30 | Fill #1

## 2017-10-05 MED FILL — hydrOXYzine HCL 25 MG TABS: 25 | 20 days supply | Qty: 60 | Fill #1

## 2017-10-05 MED FILL — ?CYCLOBENZAPRINE 10 MG TABL: 10 | 30 days supply | Qty: 60 | Fill #3

## 2017-10-17 ENCOUNTER — Ambulatory Visit: Payer: Self-pay

## 2017-11-12 ENCOUNTER — Other Ambulatory Visit: Payer: Self-pay | Admitting: Family Medicine

## 2017-11-12 DIAGNOSIS — G8929 Other chronic pain: Secondary | ICD-10-CM

## 2017-11-12 DIAGNOSIS — M5441 Lumbago with sciatica, right side: Principal | ICD-10-CM

## 2017-11-12 MED FILL — hydrOXYzine HCL 25 MG TABS: 25 | 20 days supply | Qty: 60 | Fill #2

## 2017-11-12 MED FILL — BACLOFEN 10 MG TABLET: 10 | 30 days supply | Qty: 90 | Fill #2

## 2017-11-12 MED FILL — traZODone HCL 100 MG TABS: 100 | 30 days supply | Qty: 30 | Fill #2

## 2017-11-12 MED FILL — ?DULOXETINE HCL DR 60 MG CA: 60 MG | 30 days supply | Qty: 30 | Fill #0

## 2017-11-12 MED FILL — !VENTOLIN HFA INHALER: 108 (90 BAS | 25 days supply | Qty: 18 | Fill #1 | Status: TO

## 2017-11-13 MED FILL — ?CYCLOBENZAPRINE 10 MG TABL: 10 | 30 days supply | Qty: 60 | Fill #0

## 2017-11-13 MED FILL — MELOXICAM 7.5 MG TABLET: 7.5 | 30 days supply | Qty: 30 | Fill #0

## 2017-12-07 ENCOUNTER — Ambulatory Visit: Payer: Self-pay | Admitting: Family Medicine

## 2018-01-01 ENCOUNTER — Ambulatory Visit: Payer: Self-pay | Attending: Family Medicine | Admitting: Family Medicine

## 2018-01-01 ENCOUNTER — Ambulatory Visit (HOSPITAL_BASED_OUTPATIENT_CLINIC_OR_DEPARTMENT_OTHER): Payer: Self-pay | Admitting: Licensed Clinical Social Worker

## 2018-01-01 ENCOUNTER — Encounter: Payer: Self-pay | Admitting: Family Medicine

## 2018-01-01 VITALS — BP 136/83 | HR 118 | Temp 97.5°F | Ht 64.0 in | Wt 191.8 lb

## 2018-01-01 DIAGNOSIS — G4709 Other insomnia: Secondary | ICD-10-CM

## 2018-01-01 DIAGNOSIS — G4700 Insomnia, unspecified: Secondary | ICD-10-CM | POA: Insufficient documentation

## 2018-01-01 DIAGNOSIS — R61 Generalized hyperhidrosis: Secondary | ICD-10-CM | POA: Insufficient documentation

## 2018-01-01 DIAGNOSIS — M5441 Lumbago with sciatica, right side: Secondary | ICD-10-CM | POA: Insufficient documentation

## 2018-01-01 DIAGNOSIS — K2100 Gastro-esophageal reflux disease with esophagitis, without bleeding: Secondary | ICD-10-CM

## 2018-01-01 DIAGNOSIS — F419 Anxiety disorder, unspecified: Secondary | ICD-10-CM | POA: Insufficient documentation

## 2018-01-01 DIAGNOSIS — Z79899 Other long term (current) drug therapy: Secondary | ICD-10-CM | POA: Insufficient documentation

## 2018-01-01 DIAGNOSIS — K5900 Constipation, unspecified: Secondary | ICD-10-CM | POA: Insufficient documentation

## 2018-01-01 DIAGNOSIS — E119 Type 2 diabetes mellitus without complications: Secondary | ICD-10-CM | POA: Insufficient documentation

## 2018-01-01 DIAGNOSIS — J4520 Mild intermittent asthma, uncomplicated: Secondary | ICD-10-CM | POA: Insufficient documentation

## 2018-01-01 DIAGNOSIS — F329 Major depressive disorder, single episode, unspecified: Secondary | ICD-10-CM

## 2018-01-01 DIAGNOSIS — F332 Major depressive disorder, recurrent severe without psychotic features: Secondary | ICD-10-CM

## 2018-01-01 DIAGNOSIS — M79671 Pain in right foot: Secondary | ICD-10-CM | POA: Insufficient documentation

## 2018-01-01 DIAGNOSIS — G8929 Other chronic pain: Secondary | ICD-10-CM

## 2018-01-01 DIAGNOSIS — K581 Irritable bowel syndrome with constipation: Secondary | ICD-10-CM | POA: Insufficient documentation

## 2018-01-01 LAB — POCT GLYCOSYLATED HEMOGLOBIN (HGB A1C): HEMOGLOBIN A1C: 7.3

## 2018-01-01 MED ORDER — DULOXETINE HCL 60 MG PO CPEP: 60.0000 mg | ORAL_CAPSULE | Freq: Every day | ORAL | 3 refills | Status: DC

## 2018-01-01 MED ORDER — LIDOCAINE 5 % EX PTCH: 1.0000 | MEDICATED_PATCH | CUTANEOUS | 2 refills | Status: DC

## 2018-01-01 MED ORDER — POLYETHYLENE GLYCOL 3350 17 G PO PACK: 17.0000 g | PACK | Freq: Every day | ORAL | 2 refills | Status: DC

## 2018-01-01 MED ORDER — ZOLPIDEM TARTRATE 5 MG PO TABS: 5.0000 mg | ORAL_TABLET | Freq: Every evening | ORAL | 3 refills | Status: DC | PRN

## 2018-01-01 MED ORDER — METFORMIN HCL 500 MG PO TABS: 500.0000 mg | ORAL_TABLET | Freq: Two times a day (BID) | ORAL | 3 refills | Status: DC

## 2018-01-01 MED ORDER — TRUE METRIX METER DEVI
1.0000 | Freq: Every day | 0 refills | Status: DC
Start: 2018-01-01 — End: 2018-07-23

## 2018-01-01 MED ORDER — BACLOFEN 10 MG PO TABS: 10.0000 mg | ORAL_TABLET | Freq: Three times a day (TID) | ORAL | 3 refills | Status: DC

## 2018-01-01 MED ORDER — MELOXICAM 7.5 MG PO TABS: 7.5000 mg | ORAL_TABLET | Freq: Every day | ORAL | 1 refills | Status: DC

## 2018-01-01 MED ORDER — PANTOPRAZOLE SODIUM 40 MG PO TBEC: 40.0000 mg | DELAYED_RELEASE_TABLET | Freq: Every day | ORAL | 3 refills | Status: DC

## 2018-01-01 MED ORDER — GLUCOSE BLOOD VI STRP: ORAL_STRIP | 12 refills | Status: DC

## 2018-01-01 MED ORDER — HYDROXYZINE HCL 25 MG PO TABS: 25.0000 mg | ORAL_TABLET | Freq: Three times a day (TID) | ORAL | 3 refills | Status: DC | PRN

## 2018-01-01 MED ORDER — TRUEPLUS LANCETS 28G MISC: 12 refills | Status: DC

## 2018-01-01 MED ORDER — GABAPENTIN 300 MG PO CAPS: 300.0000 mg | ORAL_CAPSULE | Freq: Two times a day (BID) | ORAL | 3 refills | Status: DC

## 2018-01-01 MED FILL — TRUE METRIX TEST STRIP: 30 days supply | Qty: 50 | Fill #0 | Status: TO

## 2018-01-01 MED FILL — !TRUE METRIX BLOOD GLUCOSE: 30 days supply | Qty: 1 | Fill #0

## 2018-01-01 MED FILL — ?METFORMIN HCL 500MG TABLET: 500 | 30 days supply | Qty: 60 | Fill #0

## 2018-01-01 MED FILL — ?PANTOPRAZOLE SOD DR 40MG: 40 MG | 30 days supply | Qty: 60 | Fill #2

## 2018-01-01 MED FILL — hydrOXYzine HCL 25 MG TABS: 25 | 20 days supply | Qty: 60 | Fill #3

## 2018-01-01 MED FILL — MELOXICAM 7.5 MG TABLET: 7.5 | 30 days supply | Qty: 30 | Fill #1

## 2018-01-01 MED FILL — GABAPENTIN 300 MG CAPSULE: 300 | 30 days supply | Qty: 60 | Fill #1

## 2018-01-01 MED FILL — BACLOFEN 10 MG TABLET: 10 | 30 days supply | Qty: 90 | Fill #3

## 2018-01-01 MED FILL — ?DULOXETINE HCL DR 60 MG CA: 60 MG | 30 days supply | Qty: 30 | Fill #0

## 2018-01-01 MED FILL — ?LIDOCAINE 5% PATCH: 5 | 30 days supply | Qty: 30 | Fill #0

## 2018-01-01 MED FILL — TRUEplus LANCETS 28G MISC: 30 days supply | Qty: 100 | Fill #0

## 2018-01-01 NOTE — Progress Notes (Signed)
Subjective:  Patient ID: Paige Andrews, female    DOB: 1956/03/12  Age: 62 y.o. MRN: [id number]  CC: Back Pain and Leg Pain   HPI Paige Andrews  is a 62 year old female with a history of IBS, previous alcohol abuse, pancreatitis, asthma, GERD, Bipolar disorder, anxiety and depression who presents for a follow up visit.  Her reflux symptoms has been controlled and she denies asthma flare.  She is requesting refills of all her medications today. Insomnia has been uncontrolled on Trazodone. Denies suicidal ideations or intents.  With regards to her bipolar disorder she is willing to see the LCSW for counseling today as she had declined previously at her last office visit.  She remains on Cymbalta and hydroxyzine.  She complains of right foot pain on the sole and dorsum of her foot which is worse with weightbearing but absent at this time.  Denies a history of trauma.  She continues to experience low back pain which radiates down her right lower extremity and continues to take Cymbalta and a muscle relaxant with no relief.  She was unable to obtain the gabapentin as she was informed it would cost $100 at the pharmacy. She also has crawling sensations on her legs and cannot get comfortable at night.  Past Medical History:  Diagnosis Date  . Anxiety   . Bipolar 1 disorder ([facility])   . Celiac disease   . Depression   . Hypertension   . IBS (irritable bowel syndrome)     Past Surgical History:  Procedure Laterality Date  . carpel tunnel Right   . CESAREAN SECTION    . SHOULDER SURGERY Right     Allergies  Allergen Reactions  . Morphine And Related Anxiety    Patient states, " it makes me  feel like I am crawling out of my skin"     Outpatient Medications Prior to Visit  Medication Sig Dispense Refill  . albuterol (PROVENTIL HFA;VENTOLIN HFA) 108 (90 Base) MCG/ACT inhaler Inhale 2 puffs into the lungs every 6 (six) hours as needed for wheezing or shortness of breath. 1 Inhaler 2    . baclofen (LIORESAL) 10 MG tablet Take 1 tablet (10 mg total) by mouth 3 (three) times daily. 90 each 3  . cyclobenzaprine (FLEXERIL) 10 MG tablet TAKE 1 TABLET (10 MG TOTAL) BY MOUTH 2 (TWO) TIMES DAILY AS NEEDED FOR MUSCLE SPASMS. 60 tablet 3  . DULoxetine (CYMBALTA) 60 MG capsule Take 1 capsule (60 mg total) by mouth daily. 30 capsule 3  . hydrOXYzine (ATARAX/VISTARIL) 25 MG tablet Take 1 tablet (25 mg total) by mouth 3 (three) times daily as needed. 60 tablet 3  . meloxicam (MOBIC) 7.5 MG tablet TAKE 1 TABLET BY MOUTH DAILY. 30 tablet 1  . pantoprazole (PROTONIX) 40 MG tablet Take 1 tablet (40 mg total) by mouth daily. 60 tablet 3  . polyethylene glycol (MIRALAX) packet Take 17 g by mouth 2 (two) times daily. 60 each 2  . promethazine (PHENERGAN) 12.5 MG tablet Take 1 tablet (12.5 mg total) by mouth every 8 (eight) hours as needed for nausea or vomiting. 30 tablet 1  . traZODone (DESYREL) 100 MG tablet Take 1 tablet (100 mg total) by mouth at bedtime. 30 tablet 3  . bisacodyl (DULCOLAX) 10 MG suppository Place 1 suppository (10 mg total) rectally daily as needed for mild constipation or moderate constipation. (Patient not taking: Reported on 09/06/2017) 30 suppository 0  . gabapentin (NEURONTIN) 300 MG capsule Take 1 capsule (  300 mg total) by mouth 2 (two) times daily. (Patient not taking: Reported on 01/01/2018) 60 capsule 3   No facility-administered medications prior to visit.     ROS Review of Systems  Constitutional: Negative for activity change, appetite change and fatigue.  HENT: Negative for congestion, sinus pressure and sore throat.   Eyes: Negative for visual disturbance.  Respiratory: Negative for cough, chest tightness, shortness of breath and wheezing.   Cardiovascular: Negative for chest pain and palpitations.  Gastrointestinal: Negative for abdominal distention, abdominal pain and constipation.  Endocrine: Negative for polydipsia.  Genitourinary: Negative for dysuria and  frequency.  Musculoskeletal:       See hpi  Skin: Negative for rash.  Neurological: Negative for tremors, light-headedness and numbness.  Hematological: Does not bruise/bleed easily.  Psychiatric/Behavioral: Negative for agitation and behavioral problems.    Objective:  BP 136/83   Pulse (!) 118   Temp (!) 97.5 F (36.4 C) (Oral)   Ht 5\' 4"  (1.626 m)   Wt 191 lb 12.8 oz (87 kg)   SpO2 96%   BMI 32.92 kg/m   BP/Weight 01/01/2018 09/06/2017 06/08/2017  Systolic BP 136 144 140  Diastolic BP 83 99 101  Wt. (Lbs) 191.8 190.6 188  BMI 32.92 32.72 32.27      Physical Exam  Constitutional: She is oriented to person, place, and time. She appears well-developed and well-nourished.  Cardiovascular: Normal heart sounds and intact distal pulses. Tachycardia present.  No murmur heard. Pulmonary/Chest: Effort normal and breath sounds normal. She has no wheezes. She has no rales. She exhibits no tenderness.  Abdominal: Soft. Bowel sounds are normal. She exhibits no distension and no mass. There is no tenderness.  Musculoskeletal: Normal range of motion. She exhibits no tenderness (no TTP of right foot or on ROM).  No TTP of lumbar spine, negative straight leg raise bilaterally  Neurological: She is alert and oriented to person, place, and time.  Skin: Skin is warm and dry.  Psychiatric: She has a normal mood and affect.     Assessment & Plan:   1. Chronic right-sided low back pain with right-sided sciatica Uncontrolled Has been unable to obtain gabapentin due to cost -she could possibly have superimposed restless leg syndrome.  Will evaluate further at next visit after she has been on the gabapentin. Patient is here to undergo x-ray which was ordered We will add Lidoderm patch - meloxicam (MOBIC) 7.5 MG tablet; Take 1 tablet (7.5 mg total) by mouth daily.  Dispense: 30 tablet; Refill: 1 - gabapentin (NEURONTIN) 300 MG capsule; Take 1 capsule (300 mg total) by mouth 2 (two) times  daily.  Dispense: 60 capsule; Refill: 3 - baclofen (LIORESAL) 10 MG tablet; Take 1 tablet (10 mg total) by mouth 3 (three) times daily.  Dispense: 90 each; Refill: 3 - lidocaine (LIDODERM) 5 %; Place 1 patch onto the skin daily. Remove & Discard patch within 12 hours or as directed by MD  Dispense: 30 patch; Refill: 2  2. Diaphoresis He currently on diaphoresis Would need to exclude hyperthyroidism - TSH  3. Other insomnia Uncontrolled on trazodone which I have discontinued. Commence Ambien - zolpidem (AMBIEN) 5 MG tablet; Take 1 tablet (5 mg total) by mouth at bedtime as needed for sleep.  Dispense: 30 tablet; Refill: 3  4. Type 2 diabetes mellitus without complication, without long-term current use of insulin (HCC) Newly diagnosed with A1c of 7.3 Diabetic teaching provided by clinical pharmacist Will discuss diabetic health maintenance at next visit -  POCT glycosylated hemoglobin (Hb A1C) - metFORMIN (GLUCOPHAGE) 500 MG tablet; Take 1 tablet (500 mg total) by mouth 2 (two) times daily with a meal.  Dispense: 60 tablet; Refill: 3 - glucose blood (TRUE METRIX BLOOD GLUCOSE TEST) test strip; Use 1 time daily before breakfast  Dispense: 30 each; Refill: 12 - Blood Glucose Monitoring Suppl (TRUE METRIX METER) DEVI; 1 each by Does not apply route daily before breakfast.  Dispense: 1 Device; Refill: 0 - TRUEPLUS LANCETS 28G MISC; Use 1 time daily before breakfast  Dispense: 30 each; Refill: 12  5. Mild intermittent asthma without complication No acute exacerbation  6. Irritable bowel syndrome with constipation Controlled - polyethylene glycol (MIRALAX) packet; Take 17 g by mouth daily.  Dispense: 30 each; Refill: 2  7. Gastroesophageal reflux disease with esophagitis Stable - pantoprazole (PROTONIX) 40 MG tablet; Take 1 tablet (40 mg total) by mouth daily.  Dispense: 60 tablet; Refill: 3  8. Anxiety and depression History of bipolar depression-will benefit from psych eval Stable LCSW  called in to see the patient - hydrOXYzine (ATARAX/VISTARIL) 25 MG tablet; Take 1 tablet (25 mg total) by mouth 3 (three) times daily as needed.  Dispense: 60 tablet; Refill: 3 - DULoxetine (CYMBALTA) 60 MG capsule; Take 1 capsule (60 mg total) by mouth daily.  Dispense: 30 capsule; Refill: 3  9. Right foot pain Likely underlying osteoarthritis Continue meloxicam   Meds ordered this encounter  Medications  . zolpidem (AMBIEN) 5 MG tablet    Sig: Take 1 tablet (5 mg total) by mouth at bedtime as needed for sleep.    Dispense:  30 tablet    Refill:  3    Discontinue Trazodone  . polyethylene glycol (MIRALAX) packet    Sig: Take 17 g by mouth daily.    Dispense:  30 each    Refill:  2  . pantoprazole (PROTONIX) 40 MG tablet    Sig: Take 1 tablet (40 mg total) by mouth daily.    Dispense:  60 tablet    Refill:  3  . meloxicam (MOBIC) 7.5 MG tablet    Sig: Take 1 tablet (7.5 mg total) by mouth daily.    Dispense:  30 tablet    Refill:  1  . hydrOXYzine (ATARAX/VISTARIL) 25 MG tablet    Sig: Take 1 tablet (25 mg total) by mouth 3 (three) times daily as needed.    Dispense:  60 tablet    Refill:  3  . gabapentin (NEURONTIN) 300 MG capsule    Sig: Take 1 capsule (300 mg total) by mouth 2 (two) times daily.    Dispense:  60 capsule    Refill:  3  . DULoxetine (CYMBALTA) 60 MG capsule    Sig: Take 1 capsule (60 mg total) by mouth daily.    Dispense:  30 capsule    Refill:  3  . baclofen (LIORESAL) 10 MG tablet    Sig: Take 1 tablet (10 mg total) by mouth 3 (three) times daily.    Dispense:  90 each    Refill:  3  . lidocaine (LIDODERM) 5 %    Sig: Place 1 patch onto the skin daily. Remove & Discard patch within 12 hours or as directed by MD    Dispense:  30 patch    Refill:  2  . metFORMIN (GLUCOPHAGE) 500 MG tablet    Sig: Take 1 tablet (500 mg total) by mouth 2 (two) times daily with a meal.    Dispense:  60 tablet    Refill:  3  . glucose blood (TRUE METRIX BLOOD GLUCOSE  TEST) test strip    Sig: Use 1 time daily before breakfast    Dispense:  30 each    Refill:  12  . Blood Glucose Monitoring Suppl (TRUE METRIX METER) DEVI    Sig: 1 each by Does not apply route daily before breakfast.    Dispense:  1 Device    Refill:  0  . TRUEPLUS LANCETS 28G MISC    Sig: Use 1 time daily before breakfast    Dispense:  30 each    Refill:  12    Follow-up: Return in about 3 months (around 04/01/2018) for Follow-up of chronic medical conditions.   Jaclyn ShaggyEnobong Amao MD

## 2018-01-01 NOTE — Patient Instructions (Signed)
Diabetes Mellitus and Nutrition When you have diabetes (diabetes mellitus), it is very important to have healthy eating habits because your blood sugar (glucose) levels are greatly affected by what you eat and drink. Eating healthy foods in the appropriate amounts, at about the same times every day, can help you:  Control your blood glucose.  Lower your risk of heart disease.  Improve your blood pressure.  Reach or maintain a healthy weight.  Every person with diabetes is different, and each person has different needs for a meal plan. Your health care provider may recommend that you work with a diet and nutrition specialist (dietitian) to make a meal plan that is best for you. Your meal plan may vary depending on factors such as:  The calories you need.  The medicines you take.  Your weight.  Your blood glucose, blood pressure, and cholesterol levels.  Your activity level.  Other health conditions you have, such as heart or kidney disease.  How do carbohydrates affect me? Carbohydrates affect your blood glucose level more than any other type of food. Eating carbohydrates naturally increases the amount of glucose in your blood. Carbohydrate counting is a method for keeping track of how many carbohydrates you eat. Counting carbohydrates is important to keep your blood glucose at a healthy level, especially if you use insulin or take certain oral diabetes medicines. It is important to know how many carbohydrates you can safely have in each meal. This is different for every person. Your dietitian can help you calculate how many carbohydrates you should have at each meal and for snack. Foods that contain carbohydrates include:  Bread, cereal, rice, pasta, and crackers.  Potatoes and corn.  Peas, beans, and lentils.  Milk and yogurt.  Fruit and juice.  Desserts, such as cakes, cookies, ice cream, and candy.  How does alcohol affect me? Alcohol can cause a sudden decrease in blood  glucose (hypoglycemia), especially if you use insulin or take certain oral diabetes medicines. Hypoglycemia can be a life-threatening condition. Symptoms of hypoglycemia (sleepiness, dizziness, and confusion) are similar to symptoms of having too much alcohol. If your health care provider says that alcohol is safe for you, follow these guidelines:  Limit alcohol intake to no more than 1 drink per day for nonpregnant women and 2 drinks per day for men. One drink equals 12 oz of beer, 5 oz of wine, or 1 oz of hard liquor.  Do not drink on an empty stomach.  Keep yourself hydrated with water, diet soda, or unsweetened iced tea.  Keep in mind that regular soda, juice, and other mixers may contain a lot of sugar and must be counted as carbohydrates.  What are tips for following this plan? Reading food labels  Start by checking the serving size on the label. The amount of calories, carbohydrates, fats, and other nutrients listed on the label are based on one serving of the food. Many foods contain more than one serving per package.  Check the total grams (g) of carbohydrates in one serving. You can calculate the number of servings of carbohydrates in one serving by dividing the total carbohydrates by 15. For example, if a food has 30 g of total carbohydrates, it would be equal to 2 servings of carbohydrates.  Check the number of grams (g) of saturated and trans fats in one serving. Choose foods that have low or no amount of these fats.  Check the number of milligrams (mg) of sodium in one serving. Most people   should limit total sodium intake to less than 2,300 mg per day.  Always check the nutrition information of foods labeled as "low-fat" or "nonfat". These foods may be higher in added sugar or refined carbohydrates and should be avoided.  Talk to your dietitian to identify your daily goals for nutrients listed on the label. Shopping  Avoid buying canned, premade, or processed foods. These  foods tend to be high in fat, sodium, and added sugar.  Shop around the outside edge of the grocery store. This includes fresh fruits and vegetables, bulk grains, fresh meats, and fresh dairy. Cooking  Use low-heat cooking methods, such as baking, instead of high-heat cooking methods like deep frying.  Cook using healthy oils, such as olive, canola, or sunflower oil.  Avoid cooking with butter, cream, or high-fat meats. Meal planning  Eat meals and snacks regularly, preferably at the same times every day. Avoid going long periods of time without eating.  Eat foods high in fiber, such as fresh fruits, vegetables, beans, and whole grains. Talk to your dietitian about how many servings of carbohydrates you can eat at each meal.  Eat 4-6 ounces of lean protein each day, such as lean meat, chicken, fish, eggs, or tofu. 1 ounce is equal to 1 ounce of meat, chicken, or fish, 1 egg, or 1/4 cup of tofu.  Eat some foods each day that contain healthy fats, such as avocado, nuts, seeds, and fish. Lifestyle   Check your blood glucose regularly.  Exercise at least 30 minutes 5 or more days each week, or as told by your health care provider.  Take medicines as told by your health care provider.  Do not use any products that contain nicotine or tobacco, such as cigarettes and e-cigarettes. If you need help quitting, ask your health care provider.  Work with a counselor or diabetes educator to identify strategies to manage stress and any emotional and social challenges. What are some questions to ask my health care provider?  Do I need to meet with a diabetes educator?  Do I need to meet with a dietitian?  What number can I call if I have questions?  When are the best times to check my blood glucose? Where to find more information:  American Diabetes Association: diabetes.org/food-and-fitness/food  Academy of Nutrition and Dietetics:  www.eatright.org/resources/health/diseases-and-conditions/diabetes  National Institute of Diabetes and Digestive and Kidney Diseases (NIH): www.niddk.nih.gov/health-information/diabetes/overview/diet-eating-physical-activity Summary  A healthy meal plan will help you control your blood glucose and maintain a healthy lifestyle.  Working with a diet and nutrition specialist (dietitian) can help you make a meal plan that is best for you.  Keep in mind that carbohydrates and alcohol have immediate effects on your blood glucose levels. It is important to count carbohydrates and to use alcohol carefully. This information is not intended to replace advice given to you by your health care provider. Make sure you discuss any questions you have with your health care provider. Document Released: 08/24/2005 Document Revised: 01/01/2017 Document Reviewed: 01/01/2017 Elsevier Interactive Patient Education  2018 Elsevier Inc.  

## 2018-01-02 ENCOUNTER — Encounter: Payer: Self-pay | Admitting: Family Medicine

## 2018-01-02 LAB — TSH: TSH: 0.931 u[IU]/mL (ref 0.450–4.500)

## 2018-01-02 NOTE — BH Specialist Note (Signed)
Integrated Behavioral Health Follow Up Visit  MRN: [id number] Name: Paige Andrews   Session Start time: 9:45 AM Session End time: 10:15 AM Total time: 30 minutes Number of Integrated Behavioral Health Clinician visits: 3/6  Type of Service: [place] Interpretor:No. Interpretor Name and Language: N/A   Warm Hand Off Completed.       SUBJECTIVE: Paige Andrews is a 62 y.o. female accompanied by patient. Patient was referred by Dr. Jarold Song for depression and anxiety. Patient reports the following symptoms/concerns: difficulty focusing, frequent panic attacks, racing thoughts, difficulty sleeping, and low energy Duration of problem: Ongoing Pt reported that she was diagnosed with Bipolar, Depression, and Anxiety at age 86; Severity of problem: severe  OBJECTIVE: Mood: Anxious and Depressed and Affect: Depressed Risk of harm to self or others: Suicidal ideation No plan to harm self or others No intent or plan to harm self or others. Safety plan discussed and crisis resources provided   LIFE CONTEXT: Family and Social: Pt resides with long time boyfriend; however, she cannot tell adult daughter or else she would be forced to leave home. Pt's partner is not working which increases financial strain and stress.  School/Work: Pt is unemployed. She was denied disability and social security benefits. She receives food stamps ($190) Self-Care: Pt has hx of substance use (alcohol) She has been sober since 2012. She is participating in medication management through PCP Life Changes: Pt is experiencing financial strain and is solely dependent on adult daughter for assistance.   GOALS ADDRESSED: Patient will reduce symptoms of: anxiety and depression and increase knowledge and/or ability of: coping skills and also: Increase adequate support systems for patient/family  INTERVENTIONS: Solution-Focused Strategies, Mindfulness or Relaxation Training and Supportive  Counseling Standardized Assessments completed: PHQ 2&9 with C-SSRS  ASSESSMENT: Patient currently experiencing depression and anxiety triggered by financial strain and sole dependence on adult daughter who is unaware of pt's relationship with long time partner. She reports difficulty focusing, frequent panic attacks, racing thoughts, difficulty sleeping, and low energy. Patient is participating in medication management through PCP; however, reports that medication is not decreasing symptoms. She may benefit from psychotherapy.   LCSWA discussed therapeutic interventions to decrease symptoms and assisted pt in goal setting to manage physical health. Pt agreed to continue to participate in support groups at [facility]. She has updated her resume and is getting assistance on obtaining employment. She is not interested in psychotherapy at this time; however, agreed to discuss stressors with counselor at [facility].    PLAN: 1. Follow up with behavioral health clinician on : Pt was encouraged tocontact LCSWA if symptoms worsen or fail to improveto schedule behavioral appointments at [facility]. 2. Behavioral recommendations: LCSWA recommends that pt apply healthy coping skills discussed, comply with medication management, and continue to participate in services at [facility]. Pt is encouraged to schedule follow up appointment with LCSWA 3. Referral(s): [facility] (In Clinic) 4. "From scale of 1-10, how likely are you to follow plan?": 10/10  Rebekah Chesterfield, LCSW 01/03/17 4:35 PM

## 2018-01-04 ENCOUNTER — Telehealth: Payer: Self-pay

## 2018-01-04 NOTE — Telephone Encounter (Signed)
patient was called and there is no VM set up to leave a message.

## 2018-01-08 ENCOUNTER — Telehealth: Payer: Self-pay

## 2018-01-08 NOTE — Telephone Encounter (Signed)
Patient was called and informed of lab results. 

## 2018-01-08 NOTE — Telephone Encounter (Signed)
Pt. Returned nurse call and was informed on her Tyroid being normal. Pt. Had no questions.

## 2018-01-31 MED FILL — ?DULoxetine HCL 60MG CPEP: 60 | 30 days supply | Qty: 30 | Fill #1

## 2018-01-31 MED FILL — metFORMIN HCL 500 MG TABS: 500 | 30 days supply | Qty: 60 | Fill #1

## 2018-01-31 MED FILL — GABAPENTIN 300 MG CAPSULE: 300 | 30 days supply | Qty: 60 | Fill #2

## 2018-01-31 MED FILL — hydrOXYzine HCL 25 MG TABS: 25 | 20 days supply | Qty: 60 | Fill #0

## 2018-02-01 MED FILL — CYCLOBENZAPRINE 10 MG TAB: 10 | 30 days supply | Qty: 60 | Fill #1

## 2018-04-01 ENCOUNTER — Other Ambulatory Visit: Payer: Self-pay

## 2018-04-01 ENCOUNTER — Encounter (HOSPITAL_COMMUNITY): Payer: Self-pay

## 2018-04-01 ENCOUNTER — Emergency Department (HOSPITAL_COMMUNITY)
Admission: EM | Admit: 2018-04-01 | Discharge: 2018-04-01 | Disposition: A | Discharge status: Home / Self Care | Payer: Medicaid Other | Attending: Emergency Medicine | Admitting: Emergency Medicine

## 2018-04-01 ENCOUNTER — Ambulatory Visit: Payer: Self-pay | Admitting: Family Medicine

## 2018-04-01 DIAGNOSIS — F319 Bipolar disorder, unspecified: Secondary | ICD-10-CM | POA: Insufficient documentation

## 2018-04-01 DIAGNOSIS — J45909 Unspecified asthma, uncomplicated: Secondary | ICD-10-CM | POA: Diagnosis not present

## 2018-04-01 DIAGNOSIS — Z7984 Long term (current) use of oral hypoglycemic drugs: Secondary | ICD-10-CM | POA: Diagnosis not present

## 2018-04-01 DIAGNOSIS — F329 Major depressive disorder, single episode, unspecified: Secondary | ICD-10-CM | POA: Insufficient documentation

## 2018-04-01 DIAGNOSIS — I10 Essential (primary) hypertension: Secondary | ICD-10-CM | POA: Diagnosis not present

## 2018-04-01 DIAGNOSIS — F419 Anxiety disorder, unspecified: Secondary | ICD-10-CM | POA: Insufficient documentation

## 2018-04-01 LAB — BASIC METABOLIC PANEL
ANION GAP: 10 (ref 5–15)
BUN: 25 mg/dL — ABNORMAL HIGH (ref 6–20)
CALCIUM: 9.2 mg/dL (ref 8.9–10.3)
CO2: 18 mmol/L — AB (ref 22–32)
Chloride: 106 mmol/L (ref 101–111)
Creatinine, Ser: 1.12 mg/dL — ABNORMAL HIGH (ref 0.44–1.00)
GFR calc non Af Amer: 52 mL/min — ABNORMAL LOW (ref >60)
GLUCOSE: 466 mg/dL — AB (ref 65–99)
Potassium: 4.2 mmol/L (ref 3.5–5.1)
Sodium: 134 mmol/L — ABNORMAL LOW (ref 135–145)

## 2018-04-01 LAB — RAPID URINE DRUG SCREEN, HOSP PERFORMED
AMPHETAMINES: NOT DETECTED
BENZODIAZEPINES: NOT DETECTED
Barbiturates: NOT DETECTED
Cocaine: NOT DETECTED
OPIATES: NOT DETECTED
Tetrahydrocannabinol: NOT DETECTED

## 2018-04-01 LAB — URINALYSIS, ROUTINE W REFLEX MICROSCOPIC
Bacteria, UA: NONE SEEN
Bilirubin Urine: NEGATIVE
Glucose, UA: >=500 mg/dL — AB
Hgb urine dipstick: NEGATIVE
Ketones, ur: NEGATIVE mg/dL
Leukocytes, UA: NEGATIVE
Nitrite: NEGATIVE
Protein, ur: NEGATIVE mg/dL
RBC / HPF: NONE SEEN RBC/hpf (ref 0–5)
Specific Gravity, Urine: 1.029 (ref 1.005–1.030)
pH: 6 (ref 5.0–8.0)

## 2018-04-01 LAB — CBC WITH DIFFERENTIAL/PLATELET
BASOS ABS: 0.1 10*3/uL (ref 0.0–0.1)
BASOS PCT: 1 %
EOS ABS: 0.3 10*3/uL (ref 0.0–0.7)
Eosinophils Relative: 3 %
HEMATOCRIT: 45.7 % (ref 36.0–46.0)
HEMOGLOBIN: 16.5 g/dL — AB (ref 12.0–15.0)
Lymphocytes Relative: 20 %
Lymphs Abs: 2.6 10*3/uL (ref 0.7–4.0)
MCH: 32.4 pg (ref 26.0–34.0)
MCHC: 36.1 g/dL — ABNORMAL HIGH (ref 30.0–36.0)
MCV: 89.8 fL (ref 78.0–100.0)
Monocytes Absolute: 0.9 10*3/uL (ref 0.1–1.0)
Monocytes Relative: 7 %
NEUTROS ABS: 8.9 10*3/uL — AB (ref 1.7–7.7)
NEUTROS PCT: 69 %
Platelets: 284 10*3/uL (ref 150–400)
RBC: 5.09 MIL/uL (ref 3.87–5.11)
RDW: 12.6 % (ref 11.5–15.5)
WBC: 12.8 10*3/uL — AB (ref 4.0–10.5)

## 2018-04-01 LAB — COMPREHENSIVE METABOLIC PANEL WITH GFR
ALT: 71 U/L — ABNORMAL HIGH (ref 14–54)
AST: 47 U/L — ABNORMAL HIGH (ref 15–41)
Albumin: 4.4 g/dL (ref 3.5–5.0)
Alkaline Phosphatase: 100 U/L (ref 38–126)
Anion gap: 16 — ABNORMAL HIGH (ref 5–15)
BUN: 26 mg/dL — ABNORMAL HIGH (ref 6–20)
CO2: 17 mmol/L — ABNORMAL LOW (ref 22–32)
Calcium: 10.6 mg/dL — ABNORMAL HIGH (ref 8.9–10.3)
Chloride: 101 mmol/L (ref 101–111)
Creatinine, Ser: 1.2 mg/dL — ABNORMAL HIGH (ref 0.44–1.00)
GFR calc Af Amer: 55 mL/min — ABNORMAL LOW
GFR calc non Af Amer: 48 mL/min — ABNORMAL LOW
Glucose, Bld: 483 mg/dL — ABNORMAL HIGH (ref 65–99)
Potassium: 4 mmol/L (ref 3.5–5.1)
Sodium: 134 mmol/L — ABNORMAL LOW (ref 135–145)
Total Bilirubin: 0.8 mg/dL (ref 0.3–1.2)
Total Protein: 8 g/dL (ref 6.5–8.1)

## 2018-04-01 LAB — SALICYLATE LEVEL: Salicylate Lvl: <7 mg/dL (ref 2.8–30.0)

## 2018-04-01 LAB — ETHANOL: Alcohol, Ethyl (B): <10 mg/dL (ref <10)

## 2018-04-01 LAB — ACETAMINOPHEN LEVEL: Acetaminophen (Tylenol), Serum: <10 ug/mL — ABNORMAL LOW (ref 10–30)

## 2018-04-01 MED ORDER — OLANZAPINE 10 MG PO TBDP
10.0000 mg | ORAL_TABLET | Freq: Every day | ORAL | Status: DC
  Filled 2018-04-01: qty 1

## 2018-04-01 MED ORDER — OLANZAPINE 10 MG PO TBDP
10.0000 mg | ORAL_TABLET | ORAL | Status: AC
  Administered 2018-04-01: 10 mg via ORAL

## 2018-04-01 MED ORDER — SODIUM CHLORIDE 0.9 % IV BOLUS
1000.0000 mL | Freq: Once | INTRAVENOUS | Status: AC
  Administered 2018-04-01: 1000 mL via INTRAVENOUS

## 2018-04-01 MED ORDER — LORAZEPAM 2 MG/ML IJ SOLN: 1.0000 mg | Freq: Once | INTRAMUSCULAR | Status: DC

## 2018-04-01 MED ORDER — LORAZEPAM 2 MG/ML IJ SOLN
1.0000 mg | Freq: Once | INTRAMUSCULAR | Status: AC
  Administered 2018-04-01: 1 mg via INTRAVENOUS

## 2018-04-01 MED ORDER — LORAZEPAM 2 MG/ML IJ SOLN
1.0000 mg | Freq: Once | INTRAMUSCULAR | Status: DC
  Filled 2018-04-01: qty 1

## 2018-04-01 NOTE — ED Provider Notes (Signed)
[place] DEPT Provider Note   CSN: [id number] Arrival date & time: 04/01/18  0219     History   Chief Complaint Chief Complaint  Patient presents with  . Anxiety    HPI Paige Andrews is a 62 y.o. female.  HPI 62 year old Caucasian female past medical history significant for anxiety, depression, PTSD, bipolar disorder, irritable bowel syndrome, hypertension, diabetes presents to the emergency department today for evaluation of itching on the inside.  Patient reports history of anxiety.  Patient is very anxious.  She states that she has an itch that is deep inside and she cannot scratch it.  Patient states that it feels like "she has to urinate or she needs to have an orgasm but cannot".  Patient reports history of bipolar disorder, anxiety and depression and has been off her medications for several years.  Patient states it feels like she has to urinate but only little comes out.  She denies any dysuria or hematuria.  Patient denies any associated abdominal pain, nausea or emesis.  Denies any fevers or chills.  Patient denies any drug use or alcohol use this evening.  Patient states that her symptoms started earlier today.  Patient denies SI, HI auditory visual hallucinations.  Patient did not taking medications for symptoms prior to arrival.  Nothing makes better or worse.  Pt denies any fever, chill, ha, vision changes, lightheadedness, dizziness, congestion, neck pain, cp, sob, cough, abd pain, n/v/d, urinary symptoms, change in bowel habits, melena, hematochezia, lower extremity paresthesias.  Past Medical History:  Diagnosis Date  . Anxiety   . Bipolar 1 disorder ([place])   . Celiac disease   . Depression   . Hypertension   . IBS (irritable bowel syndrome)     Patient Active Problem List   Diagnosis Date Noted  . Prediabetes 01/01/2018  . Asthma 09/06/2017  . Depression 06/08/2017  . Back pain 06/08/2017  . Shoulder pain 06/08/2017  .  Insomnia 06/08/2017  . IBS (irritable bowel syndrome) 03/30/2017  . GERD (gastroesophageal reflux disease) 03/30/2017  . Constipation 01/23/2017  . Nausea & vomiting 01/21/2017  . Transaminitis 01/21/2017  . 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/21/2017  . Injury of right hand 09/14/2015  . Rash 09/14/2015    Past Surgical History:  Procedure Laterality Date  . carpel tunnel Right   . CESAREAN SECTION    . SHOULDER SURGERY Right      OB History   None      Home Medications    Prior to Admission medications   Medication Sig Start Date End Date Taking? Authorizing Provider  albuterol (PROVENTIL HFA;VENTOLIN HFA) 108 (90 Base) MCG/ACT inhaler Inhale 2 puffs into the lungs every 6 (six) hours as needed for wheezing or shortness of breath. 09/06/17   Charlott Rakes, MD  baclofen (LIORESAL) 10 MG tablet Take 1 tablet (10 mg total) by mouth 3 (three) times daily. 01/01/18   Charlott Rakes, MD  Blood Glucose Monitoring Suppl (TRUE METRIX METER) DEVI 1 each by Does not apply route daily before breakfast. 01/01/18   Charlott Rakes, MD  DULoxetine (CYMBALTA) 60 MG capsule Take 1 capsule (60 mg total) by mouth daily. 01/01/18   Charlott Rakes, MD  gabapentin (NEURONTIN) 300 MG capsule Take 1 capsule (300 mg total) by mouth 2 (two) times daily. 01/01/18   Charlott Rakes, MD  glucose blood (TRUE METRIX BLOOD GLUCOSE TEST) test strip Use 1 time daily before breakfast 01/01/18   Charlott Rakes, MD  hydrOXYzine (ATARAX/VISTARIL) 25 MG tablet  Take 1 tablet (25 mg total) by mouth 3 (three) times daily as needed. 01/01/18   Charlott Rakes, MD  lidocaine (LIDODERM) 5 % Place 1 patch onto the skin daily. Remove & Discard patch within 12 hours or as directed by MD 01/01/18   Charlott Rakes, MD  meloxicam (MOBIC) 7.5 MG tablet Take 1 tablet (7.5 mg total) by mouth daily. 01/01/18   Charlott Rakes, MD  metFORMIN (GLUCOPHAGE) 500 MG tablet Take 1 tablet (500 mg total) by mouth 2 (two) times daily with a meal. 01/01/18    Charlott Rakes, MD  pantoprazole (PROTONIX) 40 MG tablet Take 1 tablet (40 mg total) by mouth daily. 01/01/18   Charlott Rakes, MD  polyethylene glycol (MIRALAX) packet Take 17 g by mouth daily. 01/01/18   Charlott Rakes, MD  TRUEPLUS LANCETS 28G MISC Use 1 time daily before breakfast 01/01/18   Charlott Rakes, MD  zolpidem (AMBIEN) 5 MG tablet Take 1 tablet (5 mg total) by mouth at bedtime as needed for sleep. 01/01/18   Charlott Rakes, MD    Family History Family History  Problem Relation Age of Onset  . Dementia Mother   . Breast cancer Mother   . Pancreatitis Father   . Dementia Father     Social History Social History   Tobacco Use  . Smoking status: Never Smoker  . Smokeless tobacco: Never Used  Substance Use Topics  . Alcohol use: No    Alcohol/week: 0.0 oz    Comment: sober x 6 yrs   . Drug use: No     Allergies   Morphine and related   Review of Systems Review of Systems  All other systems reviewed and are negative.    Physical Exam Updated Vital Signs BP (!) 137/100 (BP Location: Left Arm)   Pulse (!) 127   Temp 98.2 F (36.8 C) (Oral)   Resp 18   Ht 5' 7"  (1.702 m)   Wt 86.6 kg (191 lb)   SpO2 97%   BMI 29.91 kg/m   Physical Exam  Constitutional: She is oriented to person, place, and time. She appears well-developed and well-nourished.  HENT:  Head: Normocephalic and atraumatic.  Eyes: Pupils are equal, round, and reactive to light. Conjunctivae and EOM are normal. Right eye exhibits no discharge. Left eye exhibits no discharge. No scleral icterus.  Neck: Normal range of motion. Neck supple.  Cardiovascular: Regular rhythm, normal heart sounds and intact distal pulses. Exam reveals no gallop and no friction rub.  No murmur heard. Tachycardia  Pulmonary/Chest: Effort normal and breath sounds normal. No stridor. No respiratory distress. She has no wheezes. She has no rales. She exhibits no tenderness.  Abdominal: Soft. Normal appearance and  bowel sounds are normal. She exhibits no distension. There is no tenderness. There is no rigidity, no rebound, no guarding, no CVA tenderness, no tenderness at McBurney's point and negative Murphy's sign.  Musculoskeletal: Normal range of motion.  Neurological: She is alert and oriented to person, place, and time.  Skin: Skin is warm and dry. Capillary refill takes less than 2 seconds. No pallor.  Excoriations without any rash noted.  Psychiatric: She is hyperactive.  Patient very anxious in room.  She is pacing around and flailing on the bed.  Patient is scratching her entire body.  Patient is tearful.  Nursing note and vitals reviewed.    ED Treatments / Results  Labs (all labs ordered are listed, but only abnormal results are displayed) Labs Reviewed  COMPREHENSIVE METABOLIC  PANEL - Abnormal; Notable for the following components:      Result Value   Sodium 134 (*)    CO2 17 (*)    Glucose, Bld 483 (*)    BUN 26 (*)    Creatinine, Ser 1.20 (*)    Calcium 10.6 (*)    AST 47 (*)    ALT 71 (*)    GFR calc non Af Amer 48 (*)    GFR calc Af Amer 55 (*)    Anion gap 16 (*)    All other components within normal limits  CBC WITH DIFFERENTIAL/PLATELET - Abnormal; Notable for the following components:   WBC 12.8 (*)    Hemoglobin 16.5 (*)    MCHC 36.1 (*)    Neutro Abs 8.9 (*)    All other components within normal limits  URINALYSIS, ROUTINE W REFLEX MICROSCOPIC - Abnormal; Notable for the following components:   Glucose, UA >=500 (*)    Squamous Epithelial / LPF 0-5 (*)    All other components within normal limits  ACETAMINOPHEN LEVEL - Abnormal; Notable for the following components:   Acetaminophen (Tylenol), Serum <10 (*)    All other components within normal limits  BASIC METABOLIC PANEL - Abnormal; Notable for the following components:   Sodium 134 (*)    CO2 18 (*)    Glucose, Bld 466 (*)    BUN 25 (*)    Creatinine, Ser 1.12 (*)    GFR calc non Af Amer 52 (*)    All  other components within normal limits  URINE CULTURE  RAPID URINE DRUG SCREEN, HOSP PERFORMED  SALICYLATE LEVEL  ETHANOL    EKG None  Radiology No results found.  Procedures Procedures (including critical care time)  Medications Ordered in ED Medications  sodium chloride 0.9 % bolus 1,000 mL (1,000 mLs Intravenous New Bag/Given 04/01/18 0418)  OLANZapine zydis (ZYPREXA) disintegrating tablet 10 mg (10 mg Oral Given 04/01/18 0252)  LORazepam (ATIVAN) injection 1 mg (1 mg Intravenous Given 04/01/18 0258)     Initial Impression / Assessment and Plan / ED Course  I have reviewed the triage vital signs and the nursing notes.  Pertinent labs & imaging results that were available during my care of the patient were reviewed by me and considered in my medical decision making (see chart for details).     Patient presents to the ED for evaluation of intense pruritus.  She was tachycardic and appeared to be very uncomfortable.  Patient has history of bipolar disorder and has not been take any medications for the past several months for her symptoms.  She did appear to be in a clinically manic state.  Initially patient had a bladder scanner that was performed that revealed 64 cc of urine in her bladder.  Patient states that if she feels that her bladder is full and feels that she needs to urinate.  In and out cath was performed that only returned a small amount of urine.  I suspect the patient's symptoms are due to her mania.  Patient given Zyprexa and Ativan in the ED.  Patient states that she had complete resolution in her symptoms.  She has been resting comfortably in the ED.  Medical screening lab work was performed.  Patient CBC appears hemoconcentrated.  Patient's CMP shows a mild elevation in creatinine to 1.2 with elevation in BUN.  Patient's bicarb is slightly low her anion gap is 16.  I suspect that this is secondary to dehydration.  Patient given 2 L of fluid in the ED.  Repeat BMP shows  normal anion gap and improved bicarb.  Otherwise electrolytes are reassuring.  Patient's glucose is elevated at 466.  She is not compliant with her diabetes medication.  No ketones in the urine.  No signs of DKA.  Since ethanol, salicylate and Tylenol levels were normal.  Heart rate improved with fluids.  Patient continues to feel much improved and ready for discharge at this time.  Instructed patient to follow-up with her psychiatrist and her primary care doctor.  Pt is hemodynamically stable, in NAD, & able to ambulate in the ED. Evaluation does not show pathology that would require ongoing emergent intervention or inpatient treatment. I explained the diagnosis to the patient. Pain has been managed & has no complaints prior to dc. Pt is comfortable with above plan and is stable for discharge at this time. All questions were answered prior to disposition. Strict return precautions for f/u to the ED were discussed. Encouraged follow up with PCP.  Pt dicussed with my attending who is also saw pt and is agreeable with the above plan.   Final Clinical Impressions(s) / ED Diagnoses   Final diagnoses:  Anxiety    ED Discharge Orders    None       Aaron Edelman 04/01/18 0700    Jola Schmidt, MD 04/01/18 [phone]

## 2018-04-01 NOTE — ED Notes (Signed)
Pt reports improvement of symptoms at this time. Pt laying in bed resting comfortably.

## 2018-04-01 NOTE — ED Notes (Signed)
Bed: WA15 Expected date:  Expected time:  Means of arrival:  Comments: ems 

## 2018-04-01 NOTE — Discharge Instructions (Addendum)
Your lab work has been reassuring.  Please make sure that you are taking her medications as prescribed.  This includes her diabetes and bipolar disorder medication.  Follow-up with your primary care doctor and psychiatrist.  Return the ED with any worsening symptoms.

## 2018-04-01 NOTE — ED Triage Notes (Signed)
Since yesterday states itching on the inside no rash noted hx of anxiety attacks and PTSD no respiratory or acute distress.

## 2018-04-02 LAB — URINE CULTURE: Culture: NO GROWTH

## 2018-04-03 ENCOUNTER — Ambulatory Visit: Payer: Medicaid Other | Attending: Family Medicine | Admitting: Family Medicine

## 2018-04-03 ENCOUNTER — Ambulatory Visit: Payer: Medicaid Other | Admitting: Licensed Clinical Social Worker

## 2018-04-03 ENCOUNTER — Encounter: Payer: Self-pay | Admitting: Family Medicine

## 2018-04-03 VITALS — BP 144/84 | HR 115 | Temp 97.9°F | Ht 64.0 in | Wt 190.4 lb

## 2018-04-03 DIAGNOSIS — F332 Major depressive disorder, recurrent severe without psychotic features: Secondary | ICD-10-CM

## 2018-04-03 DIAGNOSIS — G8929 Other chronic pain: Secondary | ICD-10-CM | POA: Insufficient documentation

## 2018-04-03 DIAGNOSIS — J45909 Unspecified asthma, uncomplicated: Secondary | ICD-10-CM | POA: Diagnosis not present

## 2018-04-03 DIAGNOSIS — F329 Major depressive disorder, single episode, unspecified: Secondary | ICD-10-CM

## 2018-04-03 DIAGNOSIS — F419 Anxiety disorder, unspecified: Secondary | ICD-10-CM | POA: Diagnosis not present

## 2018-04-03 DIAGNOSIS — I10 Essential (primary) hypertension: Secondary | ICD-10-CM | POA: Diagnosis not present

## 2018-04-03 DIAGNOSIS — K900 Celiac disease: Secondary | ICD-10-CM | POA: Insufficient documentation

## 2018-04-03 DIAGNOSIS — Z09 Encounter for follow-up examination after completed treatment for conditions other than malignant neoplasm: Secondary | ICD-10-CM | POA: Insufficient documentation

## 2018-04-03 DIAGNOSIS — K589 Irritable bowel syndrome without diarrhea: Secondary | ICD-10-CM | POA: Insufficient documentation

## 2018-04-03 DIAGNOSIS — M25561 Pain in right knee: Secondary | ICD-10-CM | POA: Insufficient documentation

## 2018-04-03 DIAGNOSIS — E119 Type 2 diabetes mellitus without complications: Secondary | ICD-10-CM

## 2018-04-03 DIAGNOSIS — K219 Gastro-esophageal reflux disease without esophagitis: Secondary | ICD-10-CM | POA: Insufficient documentation

## 2018-04-03 DIAGNOSIS — Z7984 Long term (current) use of oral hypoglycemic drugs: Secondary | ICD-10-CM | POA: Insufficient documentation

## 2018-04-03 DIAGNOSIS — Z9119 Patient's noncompliance with other medical treatment and regimen: Secondary | ICD-10-CM

## 2018-04-03 DIAGNOSIS — Z9114 Patient's other noncompliance with medication regimen: Secondary | ICD-10-CM | POA: Insufficient documentation

## 2018-04-03 DIAGNOSIS — M5441 Lumbago with sciatica, right side: Secondary | ICD-10-CM | POA: Diagnosis not present

## 2018-04-03 DIAGNOSIS — E1165 Type 2 diabetes mellitus with hyperglycemia: Secondary | ICD-10-CM | POA: Diagnosis not present

## 2018-04-03 DIAGNOSIS — F319 Bipolar disorder, unspecified: Secondary | ICD-10-CM | POA: Insufficient documentation

## 2018-04-03 DIAGNOSIS — Z91199 Patient's noncompliance with other medical treatment and regimen due to unspecified reason: Secondary | ICD-10-CM

## 2018-04-03 DIAGNOSIS — R7303 Prediabetes: Secondary | ICD-10-CM | POA: Diagnosis not present

## 2018-04-03 DIAGNOSIS — Z79899 Other long term (current) drug therapy: Secondary | ICD-10-CM | POA: Insufficient documentation

## 2018-04-03 DIAGNOSIS — R0989 Other specified symptoms and signs involving the circulatory and respiratory systems: Secondary | ICD-10-CM

## 2018-04-03 LAB — POCT GLYCOSYLATED HEMOGLOBIN (HGB A1C): Hemoglobin A1C: 8.2

## 2018-04-03 LAB — GLUCOSE, POCT (MANUAL RESULT ENTRY): POC Glucose: 374 mg/dl — AB (ref 70–99)

## 2018-04-03 MED ORDER — CETIRIZINE HCL 10 MG PO TABS: 10.0000 mg | ORAL_TABLET | Freq: Every day | ORAL | 1 refills | Status: DC

## 2018-04-03 MED ORDER — METFORMIN HCL 500 MG PO TABS: 500.0000 mg | ORAL_TABLET | Freq: Two times a day (BID) | ORAL | 3 refills | Status: DC

## 2018-04-03 MED ORDER — GABAPENTIN 300 MG PO CAPS: 300.0000 mg | ORAL_CAPSULE | Freq: Two times a day (BID) | ORAL | 3 refills | Status: DC

## 2018-04-03 MED ORDER — DULOXETINE HCL 60 MG PO CPEP: 60.0000 mg | ORAL_CAPSULE | Freq: Every day | ORAL | 3 refills | Status: DC

## 2018-04-03 MED ORDER — BACLOFEN 10 MG PO TABS: 10.0000 mg | ORAL_TABLET | Freq: Three times a day (TID) | ORAL | 3 refills | Status: DC

## 2018-04-03 MED ORDER — MELOXICAM 7.5 MG PO TABS: 7.5000 mg | ORAL_TABLET | Freq: Every day | ORAL | 1 refills | Status: DC

## 2018-04-03 MED ORDER — HYDROXYZINE HCL 25 MG PO TABS: 25.0000 mg | ORAL_TABLET | Freq: Three times a day (TID) | ORAL | 3 refills | Status: DC | PRN

## 2018-04-03 MED ORDER — KETOROLAC TROMETHAMINE 60 MG/2ML IM SOLN
60.0000 mg | Freq: Once | INTRAMUSCULAR | Status: AC
  Administered 2018-04-03: 60 mg via INTRAMUSCULAR

## 2018-04-03 NOTE — Progress Notes (Signed)
Subjective:  Patient ID: Paige Andrews, female    DOB: 1956/06/05  Age: 62 y.o. MRN: [id number]  CC: Hospitalization Follow-up   HPI Paige Andrews  is a 62 year old female with a history of IBS, previous alcohol abuse, pancreatitis, asthma, GERD, Bipolar disorder, anxiety and depression who presents for a follow up visit.  She had an ED visit 2 days ago where she had presented to [facility] with anxiety and was thought to be having a manic episode.  She had presented complaining of the urge to urinate, being on the verge of an orgasm but not really having one, itching on the inside.  Symptoms persisted even after she had a foley to assess for urinary retention. Labs revealed elevated glucose in the 400s AG of 16, alcohol level was normal, CBC revealed slight leukocytosis, UA urine culture were negative for UTI, drug screen was negative.   She was treated with IV fluids, Zyprexa, Ativan with resulting improvement in her symptoms.  She presents today complaining she does not feel as bad as  when presentation to the ED but still feels pressure in her bladder, denies urinary incontinence, urinary frequency, dysuria.  She complains of "itching on the inside" but is unable to scratch it. She has not been compliant with hydroxyzine or Cymbalta due to financial constraints.  Seen by the LCSW in the clinic in the past and referred to [facility] which she never complied with. She complains of right foot pain, right knee pain, right lower back pain patient describes as feeling like someone is stabbing her on the medial side of her knee.  Received corticosteroid injections in the right knee and epidural spinal injections in the past. She has NSAIDs and muscle relaxant which again she is not taking  With regards to her diabetes mellitus,she has been unable to afford her medications due to financial constraints.  Blood pressure is elevated and she has not been on antihypertensives.  She is teary and  complains of having no income and the fact that her 401(k), IRA is all gone because of her boyfriend who will not work but just wants to keep using up all the money. She also complains of phlegm in her throat.  Past Medical History:  Diagnosis Date  . Anxiety   . Bipolar 1 disorder ([place])   . Celiac disease   . Depression   . Hypertension   . IBS (irritable bowel syndrome)    Past Medical History:  Diagnosis Date  . Anxiety   . Bipolar 1 disorder ([place])   . Celiac disease   . Depression   . Hypertension   . IBS (irritable bowel syndrome)     Past Surgical History:  Procedure Laterality Date  . carpel tunnel Right   . CESAREAN SECTION    . SHOULDER SURGERY Right      Outpatient Medications Prior to Visit  Medication Sig Dispense Refill  . albuterol (PROVENTIL HFA;VENTOLIN HFA) 108 (90 Base) MCG/ACT inhaler Inhale 2 puffs into the lungs every 6 (six) hours as needed for wheezing or shortness of breath. (Patient not taking: Reported on 04/03/2018) 1 Inhaler 2  . Blood Glucose Monitoring Suppl (TRUE METRIX METER) DEVI 1 each by Does not apply route daily before breakfast. (Patient not taking: Reported on 04/03/2018) 1 Device 0  . glucose blood (TRUE METRIX BLOOD GLUCOSE TEST) test strip Use 1 time daily before breakfast (Patient not taking: Reported on 04/03/2018) 30 each 12  . lidocaine (LIDODERM) 5 %  Place 1 patch onto the skin daily. Remove & Discard patch within 12 hours or as directed by MD (Patient not taking: Reported on 04/03/2018) 30 patch 2  . pantoprazole (PROTONIX) 40 MG tablet Take 1 tablet (40 mg total) by mouth daily. (Patient not taking: Reported on 04/03/2018) 60 tablet 3  . polyethylene glycol (MIRALAX) packet Take 17 g by mouth daily. (Patient not taking: Reported on 04/03/2018) 30 each 2  . TRUEPLUS LANCETS 28G MISC Use 1 time daily before breakfast (Patient not taking: Reported on 04/03/2018) 30 each 12  . zolpidem (AMBIEN) 5 MG tablet Take 1 tablet (5 mg total) by  mouth at bedtime as needed for sleep. (Patient not taking: Reported on 04/03/2018) 30 tablet 3  . baclofen (LIORESAL) 10 MG tablet Take 1 tablet (10 mg total) by mouth 3 (three) times daily. (Patient not taking: Reported on 04/03/2018) 90 each 3  . DULoxetine (CYMBALTA) 60 MG capsule Take 1 capsule (60 mg total) by mouth daily. (Patient not taking: Reported on 04/03/2018) 30 capsule 3  . gabapentin (NEURONTIN) 300 MG capsule Take 1 capsule (300 mg total) by mouth 2 (two) times daily. (Patient not taking: Reported on 04/03/2018) 60 capsule 3  . hydrOXYzine (ATARAX/VISTARIL) 25 MG tablet Take 1 tablet (25 mg total) by mouth 3 (three) times daily as needed. (Patient not taking: Reported on 04/03/2018) 60 tablet 3  . meloxicam (MOBIC) 7.5 MG tablet Take 1 tablet (7.5 mg total) by mouth daily. (Patient not taking: Reported on 04/03/2018) 30 tablet 1  . metFORMIN (GLUCOPHAGE) 500 MG tablet Take 1 tablet (500 mg total) by mouth 2 (two) times daily with a meal. (Patient not taking: Reported on 04/03/2018) 60 tablet 3   No facility-administered medications prior to visit.     ROS Review of Systems  Constitutional: Negative for activity change, appetite change and fatigue.  HENT: Positive for postnasal drip. Negative for congestion, sinus pressure and sore throat.   Eyes: Negative for visual disturbance.  Respiratory: Negative for cough, chest tightness, shortness of breath and wheezing.   Cardiovascular: Negative for chest pain and palpitations.  Gastrointestinal: Negative for abdominal distention, abdominal pain and constipation.  Endocrine: Negative for polydipsia.  Genitourinary: Negative for dysuria and frequency.  Musculoskeletal:       See hpi  Skin: Negative for rash.  Neurological: Negative for tremors, light-headedness and numbness.  Hematological: Does not bruise/bleed easily.  Psychiatric/Behavioral: Negative for agitation and behavioral problems. The patient is nervous/anxious.      Objective:  BP (!) 144/84   Pulse (!) 115   Temp 97.9 F (36.6 C) (Oral)   Ht _0  (1.626 m)   Wt 190 lb 6.4 oz (86.4 kg)   SpO2 94%   BMI 32.68 kg/m   BP/Weight 04/03/2018 04/01/2018 4/96/7591  Systolic BP 638 466 599  Diastolic BP 84 97 83  Wt. (Lbs) 190.4 191 191.8  BMI 32.68 29.91 32.92      Physical Exam  Constitutional: She is oriented to person, place, and time. She appears well-developed and well-nourished.  Cardiovascular: Normal rate, normal heart sounds and intact distal pulses.  No murmur heard. Pulmonary/Chest: Effort normal and breath sounds normal. She has no wheezes. She has no rales. She exhibits no tenderness.  Abdominal: Soft. Bowel sounds are normal. She exhibits no distension and no mass. There is no tenderness.  Musculoskeletal: Normal range of motion. She exhibits tenderness (TTP of medial joint line of left knee ; crepitus on ROM of b/l knee.).  No lumbar spine tenderness. Negative straight leg raise bilaterally  Neurological: She is alert and oriented to person, place, and time.  Skin: Skin is warm and dry.  Psychiatric: Her mood appears anxious.     Lab Results  Component Value Date   HGBA1C 8.2 04/03/2018    Assessment & Plan:   1. Type 2 diabetes mellitus without complication, without long-term current use of insulin (HCC) Uncontrolled due to A1c of 8.2 Poor control secondary to noncompliance with medications which is also as a result of financial constraints Encouraged previously and reemphasized again today the need to apply for the [employer] financial discount as the pharmacy on site can assist with her medications but she has not been forthcoming with paperwork required. Diabetic diet, lifestyle modifications Consider addition of ACE inhibitor at next visit if blood pressure is still elevated - POCT glucose (manual entry) - POCT glycosylated hemoglobin (Hb A1C) - metFORMIN (GLUCOPHAGE) 500 MG tablet; Take 1 tablet (500 mg total) by  mouth 2 (two) times daily with a meal.  Dispense: 60 tablet; Refill: 3  2. Chronic right-sided low back pain with right-sided sciatica Uncontrolled however she has not been taking any medications due to inability to afford them which could explain her symptoms - baclofen (LIORESAL) 10 MG tablet; Take 1 tablet (10 mg total) by mouth 3 (three) times daily.  Dispense: 90 each; Refill: 3 - gabapentin (NEURONTIN) 300 MG capsule; Take 1 capsule (300 mg total) by mouth 2 (two) times daily.  Dispense: 60 capsule; Refill: 3 - meloxicam (MOBIC) 7.5 MG tablet; Take 1 tablet (7.5 mg total) by mouth daily.  Dispense: 30 tablet; Refill: 1  3. Anxiety and depression LCSW called in for therapy and assist with referral to [facility] She has presented with similar symptoms in the past and has met with the LCSW in the past but never followed through with recommendations to follow-up with psychiatry Financial constraints impairing ability to obtain medications Underlying social stressors as well - DULoxetine (CYMBALTA) 60 MG capsule; Take 1 capsule (60 mg total) by mouth daily.  Dispense: 30 capsule; Refill: 3 - hydrOXYzine (ATARAX/VISTARIL) 25 MG tablet; Take 1 tablet (25 mg total) by mouth 3 (three) times daily as needed.  Dispense: 60 tablet; Refill: 3  4. Non-compliance Financial constraints have been the major limitation Unfortunately symptoms are likely to persist with her medical conditions remaining uncontrolled as long as she does not take her medications  5. Phlegm in throat Commenced on Zyrtec - cetirizine (ZYRTEC) 10 MG tablet; Take 1 tablet (10 mg total) by mouth daily.  Dispense: 30 tablet; Refill: 1  6.  Knee pain Status post corticosteroid injection in the past Refilled meloxicam Meds ordered this encounter  Medications  . baclofen (LIORESAL) 10 MG tablet    Sig: Take 1 tablet (10 mg total) by mouth 3 (three) times daily.    Dispense:  90 each    Refill:  3  . DULoxetine (CYMBALTA) 60 MG  capsule    Sig: Take 1 capsule (60 mg total) by mouth daily.    Dispense:  30 capsule    Refill:  3  . gabapentin (NEURONTIN) 300 MG capsule    Sig: Take 1 capsule (300 mg total) by mouth 2 (two) times daily.    Dispense:  60 capsule    Refill:  3  . hydrOXYzine (ATARAX/VISTARIL) 25 MG tablet    Sig: Take 1 tablet (25 mg total) by mouth 3 (three) times daily as needed.  Dispense:  60 tablet    Refill:  3  . meloxicam (MOBIC) 7.5 MG tablet    Sig: Take 1 tablet (7.5 mg total) by mouth daily.    Dispense:  30 tablet    Refill:  1  . metFORMIN (GLUCOPHAGE) 500 MG tablet    Sig: Take 1 tablet (500 mg total) by mouth 2 (two) times daily with a meal.    Dispense:  60 tablet    Refill:  3  . cetirizine (ZYRTEC) 10 MG tablet    Sig: Take 1 tablet (10 mg total) by mouth daily.    Dispense:  30 tablet    Refill:  1    Follow-up: Return in about 6 weeks (around 05/15/2018) for coordination of care.   Charlott Rakes MD

## 2018-04-03 NOTE — Progress Notes (Signed)
190 

## 2018-04-03 NOTE — BH Specialist Note (Signed)
Integrated Behavioral Health Follow Up Visit  MRN: [id number] Name: Paige Andrews   Session Start time: 3:00 PM Session End time: 3:30 PM Total time: 30 minutes Number of [place] Clinician visits: 2/6  Type of Service: [place] Interpretor:No. Interpretor Name and Language: N/A   Warm Hand Off Completed.       SUBJECTIVE: Paige Andrews is a 62 y.o. female accompanied by patient. Patient was referred by Dr. Jarold Song for depression and anxiety. Patient reports the following symptoms/concerns: overwhelming feelings of sadness and worry about finances, feeling bad about self, nightmares, difficulty focusing, frequent panic attacks, racing thoughts, and low energy Duration of problem: Ongoing Paige Andrews reported that she was diagnosed with Bipolar, Depression, and Anxiety at age 20; Severity of problem: severe  OBJECTIVE: Mood: Anxious and Depressed and Affect: Depressed and Tearful Risk of harm to self or others: Suicidal ideation No plan to harm self or others No intent or plan to harm self or others. Safety plan discussed and crisis resources provided   LIFE CONTEXT: Family and Social: Paige Andrews resides with long time boyfriend. Paige Andrews's partner is not working which increases financial strain and stress.  School/Work: Paige Andrews is unemployed, states she is unable to work due to mental and physical health. She was denied disability and social security benefits. She receives food stamps ($190) Self-Care: Paige Andrews has hx of substance use (alcohol) She has been sober since 2012. She has not been able to afford medications for medication management due to financial strain. Life Changes: Paige Andrews is experiencing financial strain and is solely dependent on adult daughter for assistance. She has been experiencing an increase in anxiety and depression; however, has been unable to afford medications  GOALS ADDRESSED: Patient will reduce symptoms of: anxiety, depression and  stress and increase knowledge and/or ability of: self-management skills and stress reduction and also: Increase adequate support systems for patient/family, Increase motivation to adhere to plan of care and Improve medication compliance  INTERVENTIONS: Solution-Focused Strategies, Supportive Counseling and Link to [employer] Standardized Assessments completed: PHQ 2&9 with C-SSRS  ASSESSMENT: Patient currently experiencing depression and anxiety triggered by financial strain and sole dependence on adult daughter. She reports overwhelming feelings of sadness and worry about finances, feeling bad about self, nightmares, difficulty focusing, frequent panic attacks, racing thoughts, and low energy. Patient has been unable to afford medications due to financial strain and is in danger of also having her utilities disconnected. She may benefit from psychotherapy.   LCSWA discussed the importance of establishing healthy boundaries and provided strategies to enforce them in the household. Paige Andrews has good insight on how her physical and mental health is impacted by ongoing stress. Paige Andrews was strongly encouraged to initiate psychotherapy at this time and was provided information on [facility] the [place]. Safety plan discussed and crisis resources provided, in addition, to transportation, and community agencies that provide utility assistance.  PLAN: 1. Follow up with behavioral health clinician on : Paige Andrews was encouraged tocontact LCSWA if symptoms worsen or fail to improveto schedule behavioral appointments at [facility]. 2. Behavioral recommendations: LCSWA recommends that Paige Andrews apply healthy coping skills discussed, comply with medication management, and initiate psychotherapy at [facility] 3. Referral(s): Armed forces logistics/support/administrative officer (LME/Outside Clinic) and [employer] Resources:  Financial trader 4. "From scale of 1-10, how likely are you to follow plan?": 10/10  Rebekah Chesterfield, LCSW 04/05/18  3:24 PM

## 2018-05-21 ENCOUNTER — Ambulatory Visit: Payer: Self-pay | Admitting: Family Medicine

## 2018-06-11 ENCOUNTER — Telehealth: Payer: Self-pay | Admitting: Family Medicine

## 2018-06-11 NOTE — Telephone Encounter (Signed)
Patient called and stated that she needed an appointment for her miagranes, patient also stated that she also has dizzy spells. Patient was informed to go to urgent care due to not slot being open to be seen today for any provider.  Patient stated she did not want to go to urgent care.

## 2018-06-26 ENCOUNTER — Ambulatory Visit: Payer: Medicaid Other | Attending: Family Medicine | Admitting: Physician Assistant

## 2018-06-26 ENCOUNTER — Ambulatory Visit: Payer: Medicaid Other | Admitting: Licensed Clinical Social Worker

## 2018-06-26 VITALS — BP 117/84 | HR 118 | Temp 99.2°F | Resp 18 | Ht 64.0 in | Wt 171.0 lb

## 2018-06-26 DIAGNOSIS — K900 Celiac disease: Secondary | ICD-10-CM | POA: Insufficient documentation

## 2018-06-26 DIAGNOSIS — K589 Irritable bowel syndrome without diarrhea: Secondary | ICD-10-CM | POA: Diagnosis not present

## 2018-06-26 DIAGNOSIS — F332 Major depressive disorder, recurrent severe without psychotic features: Secondary | ICD-10-CM

## 2018-06-26 DIAGNOSIS — F319 Bipolar disorder, unspecified: Secondary | ICD-10-CM | POA: Insufficient documentation

## 2018-06-26 DIAGNOSIS — Z9114 Patient's other noncompliance with medication regimen: Secondary | ICD-10-CM | POA: Diagnosis not present

## 2018-06-26 DIAGNOSIS — Z7984 Long term (current) use of oral hypoglycemic drugs: Secondary | ICD-10-CM | POA: Insufficient documentation

## 2018-06-26 DIAGNOSIS — E1165 Type 2 diabetes mellitus with hyperglycemia: Secondary | ICD-10-CM | POA: Diagnosis present

## 2018-06-26 DIAGNOSIS — Z79899 Other long term (current) drug therapy: Secondary | ICD-10-CM | POA: Diagnosis not present

## 2018-06-26 DIAGNOSIS — F419 Anxiety disorder, unspecified: Secondary | ICD-10-CM | POA: Diagnosis present

## 2018-06-26 DIAGNOSIS — H539 Unspecified visual disturbance: Secondary | ICD-10-CM

## 2018-06-26 DIAGNOSIS — I10 Essential (primary) hypertension: Secondary | ICD-10-CM | POA: Diagnosis not present

## 2018-06-26 DIAGNOSIS — Z885 Allergy status to narcotic agent status: Secondary | ICD-10-CM | POA: Diagnosis not present

## 2018-06-26 DIAGNOSIS — H3530 Unspecified macular degeneration: Secondary | ICD-10-CM | POA: Diagnosis not present

## 2018-06-26 DIAGNOSIS — G629 Polyneuropathy, unspecified: Secondary | ICD-10-CM

## 2018-06-26 DIAGNOSIS — G8929 Other chronic pain: Secondary | ICD-10-CM

## 2018-06-26 DIAGNOSIS — F329 Major depressive disorder, single episode, unspecified: Secondary | ICD-10-CM | POA: Insufficient documentation

## 2018-06-26 DIAGNOSIS — Z91199 Patient's noncompliance with other medical treatment and regimen due to unspecified reason: Secondary | ICD-10-CM

## 2018-06-26 DIAGNOSIS — Z9119 Patient's noncompliance with other medical treatment and regimen: Secondary | ICD-10-CM

## 2018-06-26 DIAGNOSIS — M5441 Lumbago with sciatica, right side: Secondary | ICD-10-CM | POA: Insufficient documentation

## 2018-06-26 DIAGNOSIS — E1140 Type 2 diabetes mellitus with diabetic neuropathy, unspecified: Secondary | ICD-10-CM | POA: Insufficient documentation

## 2018-06-26 LAB — GLUCOSE, POCT (MANUAL RESULT ENTRY)
POC GLUCOSE: 472 mg/dL — AB (ref 70–99)
POC GLUCOSE: 394 mg/dL — AB (ref 70–99)

## 2018-06-26 MED ORDER — INSULIN ASPART 100 UNIT/ML ~~LOC~~ SOLN
20.0000 [IU] | Freq: Once | SUBCUTANEOUS | Status: AC
  Administered 2018-06-26: 20 [IU] via SUBCUTANEOUS

## 2018-06-26 MED ORDER — GABAPENTIN 300 MG PO CAPS: 300.0000 mg | ORAL_CAPSULE | Freq: Two times a day (BID) | ORAL | 3 refills | Status: DC

## 2018-06-26 MED ORDER — METFORMIN HCL 500 MG PO TABS: 1000.0000 mg | ORAL_TABLET | Freq: Two times a day (BID) | ORAL | 3 refills | Status: DC

## 2018-06-26 NOTE — Progress Notes (Signed)
Patient ID: Paige Andrews, female   DOB: 1956-10-05, 62 y.o.   MRN: [id number]   Deirdre Gryder, is a 62 y.o. female  IRW:[id number]  PYP:[id number]  DOB - October 19, 1956  Subjective:  Chief Complaint and HPI: Paige Andrews is a 62 y.o. female here today for multiple issues.  Rarely taking metformin.  Boyfriend with patient and says she eats sugar, rice, pasta all the time.  Doesn't check blood sugar at home  C/o leg pain-supposed to be on gabapentin but isn't taking it.    Also has anxiety and depression.  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  Multiple finanacial stressors.   R eye with dimming of vision esp when she cooks meals.  No eye pain.    ROS:   Constitutional:  No f/c, No night sweats, No unexplained weight loss. EENT:   No mouth, throat, or ear problems.  Respiratory: No cough, No SOB Cardiac: No CP, no palpitations GI:  No abd pain, No N/V/D. GU: No Urinary s/sx Musculoskeletal: No joint pain Neuro: No headache, no dizziness, no motor weakness.  Skin: No rash Endocrine:  + polydipsia. No polyuria.  Psych: Denies SI/HI  Problem  Diabetes (Hcc)    ALLERGIES: Allergies  Allergen Reactions  . Morphine And Related Anxiety    Patient states, " it makes me  feel like I am crawling out of my skin"    PAST MEDICAL HISTORY: Past Medical History:  Diagnosis Date  . Anxiety   . Bipolar 1 disorder ([place])   . Celiac disease   . Depression   . Hypertension   . IBS (irritable bowel syndrome)     MEDICATIONS AT HOME: Prior to Admission medications   Medication Sig Start Date End Date Taking? Authorizing Provider  albuterol (PROVENTIL HFA;VENTOLIN HFA) 108 (90 Base) MCG/ACT inhaler Inhale 2 puffs into the lungs every 6 (six) hours as needed for wheezing or shortness of breath. Patient not taking: Reported on 04/03/2018 09/06/17   Charlott Rakes, MD  baclofen (LIORESAL) 10 MG tablet Take 1 tablet (10 mg total) by mouth 3 (three) times daily. 04/03/18   Charlott Rakes, MD    Blood Glucose Monitoring Suppl (TRUE METRIX METER) DEVI 1 each by Does not apply route daily before breakfast. Patient not taking: Reported on 04/03/2018 01/01/18   Charlott Rakes, MD  cetirizine (ZYRTEC) 10 MG tablet Take 1 tablet (10 mg total) by mouth daily. 04/03/18   Charlott Rakes, MD  DULoxetine (CYMBALTA) 60 MG capsule Take 1 capsule (60 mg total) by mouth daily. 04/03/18   Charlott Rakes, MD  gabapentin (NEURONTIN) 300 MG capsule Take 1 capsule (300 mg total) by mouth 2 (two) times daily. 06/26/18   Argentina Donovan, PA-C  glucose blood (TRUE METRIX BLOOD GLUCOSE TEST) test strip Use 1 time daily before breakfast Patient not taking: Reported on 04/03/2018 01/01/18   Charlott Rakes, MD  hydrOXYzine (ATARAX/VISTARIL) 25 MG tablet Take 1 tablet (25 mg total) by mouth 3 (three) times daily as needed. 04/03/18   Charlott Rakes, MD  lidocaine (LIDODERM) 5 % Place 1 patch onto the skin daily. Remove & Discard patch within 12 hours or as directed by MD Patient not taking: Reported on 04/03/2018 01/01/18   Charlott Rakes, MD  meloxicam (MOBIC) 7.5 MG tablet Take 1 tablet (7.5 mg total) by mouth daily. 04/03/18   Charlott Rakes, MD  metFORMIN (GLUCOPHAGE) 500 MG tablet Take 2 tablets (1,000 mg total) by mouth 2 (two) times daily with a meal. 06/26/18   Kolbi Tofte,  Dionne Bucy, PA-C  pantoprazole (PROTONIX) 40 MG tablet Take 1 tablet (40 mg total) by mouth daily. Patient not taking: Reported on 04/03/2018 01/01/18   Charlott Rakes, MD  polyethylene glycol [facility]) packet Take 17 g by mouth daily. Patient not taking: Reported on 04/03/2018 01/01/18   Charlott Rakes, MD  TRUEPLUS LANCETS 28G MISC Use 1 time daily before breakfast Patient not taking: Reported on 04/03/2018 01/01/18   Charlott Rakes, MD  zolpidem (AMBIEN) 5 MG tablet Take 1 tablet (5 mg total) by mouth at bedtime as needed for sleep. Patient not taking: Reported on 04/03/2018 01/01/18   Charlott Rakes, MD     Objective:  EXAM:   Vitals:    06/26/18 1345  BP: 117/84  Pulse: (!) 118  Resp: 18  Temp: 99.2 F (37.3 C)  TempSrc: Oral  SpO2: 97%  Weight: 171 lb (77.6 kg)  Height: 5' 4"  (1.626 m)    General appearance : A&OX3. NAD. Non-toxic-appearing HEENT: Atraumatic and Normocephalic.  PERRLA. EOM intact.  Fundi with dark spots B macula. TM clear B. Mouth-MMM, post pharynx WNL w/o erythema, No PND. Neck: supple, no JVD. No cervical lymphadenopathy. No thyromegaly Chest/Lungs:  Breathing-non-labored, Good air entry bilaterally, breath sounds normal without rales, rhonchi, or wheezing  CVS: S1 S2 regular, no murmurs, gallops, rubs  Extremities: Bilateral Lower Ext shows no edema, both legs are warm to touch with = pulse throughout Neurology:  CN II-XII grossly intact, Non focal.   Psych:  TP linear. J/I WNL. Normal speech. Appropriate eye contact and affect.  Skin:  No Rash  Data Review Lab Results  Component Value Date   HGBA1C 8.2 04/03/2018   HGBA1C 7.3 01/01/2018   HGBA1C 6.3 (H) 01/22/2017     Assessment & Plan   1. Vision changes Likely diabetes related. ? Macular degeneration - Ambulatory referral to Ophthalmology  2. Anxiety Continue cymbalta.  She is sober for a while but does not actively engage in recovery.  I advised 12 step meetings as this would help with dealing with anxiety and depression sober.  I gave her an AA meeting schedule and encouraged her to attend.  She also met with the social worker today.    3. Type 2 diabetes mellitus with hyperglycemia, without long-term current use of insulin (HCC) Increase dose of metformin and stressed compliance in general.  She has only rarely been taking it and not checkin her sugar.   - Glucose (CBG) - insulin aspart (novoLOG) injection 20 Units and gave 20 ounces of water.  - metFORMIN (GLUCOPHAGE) 500 MG tablet; Take 2 tablets (1,000 mg total) by mouth 2 (two) times daily with a meal.  Dispense: 120 tablet; Refill: 3 - Ambulatory referral to  Ophthalmology  4. Chronic right-sided low back pain with right-sided sciatica/neuropathy - gabapentin (NEURONTIN) 300 MG capsule; Take 1 capsule (300 mg total) by mouth 2 (two) times daily.  Dispense: 60 capsule; Refill: 3  5. Non-compliance Stressed complaince at length.  Spent >30 mins face to face with patient and partner on all of theses issues including diet, exercise, adequate water intake, self-care, anxiety management, 12-step recovery  6. Neuropathy Resume gabapentin     Patient have been counseled extensively about nutrition and exercise  Return in about 3 weeks (around 07/17/2018) for Dr Margarita Rana for diabetes management.  The patient was given clear instructions to go to ER or return to medical center if symptoms don't improve, worsen or new problems develop. The patient verbalized understanding. The patient was  told to call to get lab results if they haven't heard anything in the next week.     Freeman Caldron, PA-C [facility] and [place] [place], [place]   06/26/2018, 2:17 PM

## 2018-06-26 NOTE — BH Specialist Note (Signed)
Integrated Behavioral Health Follow Up Visit  MRN: [id number] Name: Janiyha Montufar  Number of Integrated Behavioral Health Clinician visits: 3/6 Session Start time: 2:10 PM  Session End time: 2:40 PM Total time: 30 minutes  Type of Service: Integrated Behavioral Health- Individual/Family Interpretor:No. Interpretor Name and Language: N/A  SUBJECTIVE: Loyal Rudy is a 62 y.o. female accompanied by Partner/Significant Other Patient was referred by Trena Platt for depression and anxiety. Patient reports the following symptoms/concerns: overwhelming feelings of sadness and worry, difficulty sleeping, low energy and motivation, poor eating habits, feeling bad about self, decreased concentration, irritability, and hx of suicidal ideations Duration of problem: Ongoing; Severity of problem: severe  OBJECTIVE: Mood: Anxious and Depressed and Affect: Depressed Risk of harm to self or others: No plan to harm self or others  LIFE CONTEXT: Family and Social: Pt resides with long time boyfriend. Pt's partner is not working which increases financial strain and stress. She receives strong financial support from adult daughter. School/Work: Pt is unemployed, states she is unable to work due to mental and physical health. She was denied disability and social security benefits. She receives food stamps ($190) Self-Care: Pt has hx of substance use (alcohol) She has been sober since 2012. She participates in medication management through PCP; however, reports not taking medication on a routine basis Life Changes: Pt has ongoing medical conditions that have been difficult for patient to manage due to her mental and physical health. She is experiencing financial strain and is solely dependent on adult daughter for assistance.   GOALS ADDRESSED: Patient will: 1.  Reduce symptoms of: anxiety, depression, insomnia and stress  2.  Increase knowledge and/or ability of: healthy habits, self-management skills and  stress reduction  3.  Demonstrate ability to: Increase adequate support systems for patient/family, Increase motivation to adhere to plan of care and Improve medication compliance  INTERVENTIONS: Interventions utilized:  Mining engineer, Supportive Counseling, Sleep Hygiene and Link to [facility] Standardized Assessments completed: GAD-7 and PHQ 2&9 with C-SSRS  ASSESSMENT: Patient currently experiencing depression and anxiety triggered by ongoing medical conditions that have been difficult for patient to manage due to her mental and physical health. She reports overwhelming feelings of sadness and worry, difficulty sleeping, low energy and motivation, poor eating habits, feeling bad about self, decreased concentration, irritability, and hx of suicidal ideations. Pt is experiencing financial strain and is solely dependent on adult daughter for assistance. Pt denies current SI/HI/AVH. She verbalized awareness of crisis intervention resources.   Patient participates in medication management through PCP; however, reports not taking medication on a routine basis. LCSWA discussed with family the importance of compliance to the treatment plan, in order for symptoms to improve. Strategies to assist with medication compliance and the management of chronic health conditions were discussed. Pt identified goals, in addition, to ways she can address barriers. LCSWA provided pt with requested information on free classes to improve computer skills, in addition, to resources to obtain employment. She agreed to attend [employer] upcoming classes for finance and job readiness.   PLAN: 1. Follow up with behavioral health clinician on : Pt was encouraged tocontact LCSWA if symptoms worsen or fail to improveto schedule behavioral appointments at [facility]. 2. Behavioral recommendations: LCSWA recommends that pt apply healthy coping skills discussed, comply with medication management, and  initiate psychotherapy at [facility] 3. Referral(s): Integrated Art gallery manager (In Clinic) and [employer]:  Finances  4. "From scale of 1-10, how likely are you to follow plan?": 7/10  Bridgett LarssonJasmine D Garet Hooton, LCSW 06/28/18 10:31 AM

## 2018-06-26 NOTE — Patient Instructions (Signed)
Check blood sugar fasting and at bedtime and record and bring to next visit.    Resume metformin 1 tab twice daily for 1 week then increase dose to 2 tabs twice daily.

## 2018-07-16 LAB — HM DIABETES EYE EXAM

## 2018-07-16 MED FILL — LATANOPROST 0.005% EYE DRP: 0.005 | 18 days supply | Qty: 3 | Fill #0

## 2018-07-17 MED FILL — metFORMIN HCL 500 MG TABS: 500 | 30 days supply | Qty: 60 | Fill #0

## 2018-07-17 MED FILL — GABAPENTIN 300 MG CAPSULE: 300 | 30 days supply | Qty: 60 | Fill #0

## 2018-07-17 MED FILL — BACLOFEN 10 MG TABLET: 10 | 30 days supply | Qty: 90 | Fill #0

## 2018-07-17 MED FILL — DULoxetine HCL 60 MG CPEP: 60 | 30 days supply | Qty: 30 | Fill #0

## 2018-07-17 MED FILL — hydrOXYzine HCL 25 MG TABS: 25 | 20 days supply | Qty: 60 | Fill #0

## 2018-07-22 MED FILL — PANTOPRAZOLE SOD DR 40 MG T: 40 | 30 days supply | Qty: 30 | Fill #0

## 2018-07-23 ENCOUNTER — Other Ambulatory Visit: Payer: Self-pay

## 2018-07-23 MED ORDER — ACCU-CHEK AVIVA PLUS W/DEVICE KIT: PACK | 0 refills | Status: AC

## 2018-07-23 MED ORDER — ACCU-CHEK SOFTCLIX LANCETS MISC: 12 refills | Status: DC

## 2018-07-23 MED ORDER — GLUCOSE BLOOD VI STRP: ORAL_STRIP | 12 refills | Status: AC

## 2018-07-23 MED ORDER — ALBUTEROL SULFATE HFA 108 (90 BASE) MCG/ACT IN AERS: 2.0000 | INHALATION_SPRAY | Freq: Four times a day (QID) | RESPIRATORY_TRACT | 1 refills | Status: DC | PRN

## 2018-07-23 MED FILL — ACCU-CHEK AVIVA PLUS METER: W/DEVICE | 30 days supply | Qty: 1 | Fill #0

## 2018-07-23 MED FILL — ACCU-CHEK AVIVA PLUS TEST S: 90 days supply | Qty: 100 | Fill #0

## 2018-07-23 MED FILL — PROVENTIL HFA 108 (90 BASE): 108 (90 BAS | 25 days supply | Qty: 7 | Fill #0

## 2018-07-23 MED FILL — ACCU-CHEK FASTCLIX LANCETS: 90 days supply | Qty: 102 | Fill #0

## 2018-07-23 NOTE — Telephone Encounter (Signed)
Pt is now covered by medicaid and needs rx for AccuChek testing supplies

## 2018-07-29 ENCOUNTER — Other Ambulatory Visit: Payer: Self-pay

## 2018-07-29 MED ORDER — ACCU-CHEK FASTCLIX LANCET KIT: PACK | 0 refills | Status: AC

## 2018-08-16 MED FILL — PREDNISOLONE AC 1% EYE DROP: 1 | 20 days supply | Qty: 5 | Fill #0

## 2018-08-16 MED FILL — KETOROLAC 0.4% OPHTH SOLN: 0.4 | 20 days supply | Qty: 5 | Fill #0

## 2018-08-16 MED FILL — OFLOXACIN 0.3% EYE DROPS: 0.3 | 20 days supply | Qty: 5 | Fill #0

## 2018-08-22 MED FILL — metFORMIN HCL 500 MG TABS: 500 | 30 days supply | Qty: 120 | Fill #0

## 2018-08-26 ENCOUNTER — Ambulatory Visit: Payer: Medicaid Other | Attending: Family Medicine | Admitting: Family Medicine

## 2018-08-26 ENCOUNTER — Other Ambulatory Visit: Payer: Self-pay

## 2018-08-26 ENCOUNTER — Encounter: Payer: Self-pay | Admitting: Family Medicine

## 2018-08-26 ENCOUNTER — Other Ambulatory Visit: Payer: Self-pay | Admitting: Pharmacist

## 2018-08-26 VITALS — BP 164/106 | HR 79 | Temp 98.2°F | Resp 18 | Ht 64.0 in | Wt 170.8 lb

## 2018-08-26 DIAGNOSIS — G4700 Insomnia, unspecified: Secondary | ICD-10-CM | POA: Insufficient documentation

## 2018-08-26 DIAGNOSIS — Z1159 Encounter for screening for other viral diseases: Secondary | ICD-10-CM

## 2018-08-26 DIAGNOSIS — F419 Anxiety disorder, unspecified: Secondary | ICD-10-CM

## 2018-08-26 DIAGNOSIS — F329 Major depressive disorder, single episode, unspecified: Secondary | ICD-10-CM

## 2018-08-26 DIAGNOSIS — K2100 Gastro-esophageal reflux disease with esophagitis, without bleeding: Secondary | ICD-10-CM

## 2018-08-26 DIAGNOSIS — E1165 Type 2 diabetes mellitus with hyperglycemia: Secondary | ICD-10-CM

## 2018-08-26 DIAGNOSIS — G8929 Other chronic pain: Secondary | ICD-10-CM

## 2018-08-26 DIAGNOSIS — K529 Noninfective gastroenteritis and colitis, unspecified: Secondary | ICD-10-CM

## 2018-08-26 DIAGNOSIS — Z7984 Long term (current) use of oral hypoglycemic drugs: Secondary | ICD-10-CM | POA: Insufficient documentation

## 2018-08-26 DIAGNOSIS — M5441 Lumbago with sciatica, right side: Secondary | ICD-10-CM | POA: Diagnosis not present

## 2018-08-26 DIAGNOSIS — Z79899 Other long term (current) drug therapy: Secondary | ICD-10-CM | POA: Insufficient documentation

## 2018-08-26 DIAGNOSIS — F319 Bipolar disorder, unspecified: Secondary | ICD-10-CM | POA: Insufficient documentation

## 2018-08-26 DIAGNOSIS — G4709 Other insomnia: Secondary | ICD-10-CM | POA: Diagnosis not present

## 2018-08-26 DIAGNOSIS — I10 Essential (primary) hypertension: Secondary | ICD-10-CM | POA: Diagnosis not present

## 2018-08-26 LAB — POCT GLYCOSYLATED HEMOGLOBIN (HGB A1C)
HBA1C, POC (CONTROLLED DIABETIC RANGE): 8.3 % — AB (ref 0.0–7.0)
HbA1c POC (<> result, manual entry): 8.3 % (ref 4.0–5.6)
HbA1c, POC (prediabetic range): 8.3 % — AB (ref 5.7–6.4)
Hemoglobin A1C: 8.3 % — AB (ref 4.0–5.6)

## 2018-08-26 LAB — GLUCOSE, POCT (MANUAL RESULT ENTRY): POC GLUCOSE: 181 mg/dL — AB (ref 70–99)

## 2018-08-26 MED ORDER — HYDROXYZINE HCL 25 MG PO TABS: 25.0000 mg | ORAL_TABLET | Freq: Three times a day (TID) | ORAL | 3 refills | Status: DC | PRN

## 2018-08-26 MED ORDER — METFORMIN HCL 500 MG PO TABS: 1000.0000 mg | ORAL_TABLET | Freq: Two times a day (BID) | ORAL | 3 refills | Status: DC

## 2018-08-26 MED ORDER — GABAPENTIN 300 MG PO CAPS: 300.0000 mg | ORAL_CAPSULE | Freq: Two times a day (BID) | ORAL | 3 refills | Status: DC

## 2018-08-26 MED ORDER — ACCU-CHEK SOFTCLIX LANCETS MISC: 12 refills | Status: AC

## 2018-08-26 MED ORDER — BACLOFEN 10 MG PO TABS: 10.0000 mg | ORAL_TABLET | Freq: Three times a day (TID) | ORAL | 3 refills | Status: DC

## 2018-08-26 MED ORDER — PROMETHAZINE HCL 25 MG PO TABS: 25.0000 mg | ORAL_TABLET | Freq: Three times a day (TID) | ORAL | 0 refills | Status: DC | PRN

## 2018-08-26 MED ORDER — ATORVASTATIN CALCIUM 20 MG PO TABS: 20.0000 mg | ORAL_TABLET | Freq: Every day | ORAL | 3 refills | Status: DC

## 2018-08-26 MED ORDER — DULOXETINE HCL 60 MG PO CPEP: 60.0000 mg | ORAL_CAPSULE | Freq: Every day | ORAL | 3 refills | Status: DC

## 2018-08-26 MED ORDER — LISINOPRIL 5 MG PO TABS: 5.0000 mg | ORAL_TABLET | Freq: Every day | ORAL | 3 refills | Status: DC

## 2018-08-26 MED ORDER — ZOLPIDEM TARTRATE 5 MG PO TABS: 5.0000 mg | ORAL_TABLET | Freq: Every evening | ORAL | 3 refills | Status: DC | PRN

## 2018-08-26 MED ORDER — PANTOPRAZOLE SODIUM 40 MG PO TBEC: 40.0000 mg | DELAYED_RELEASE_TABLET | Freq: Every day | ORAL | 3 refills | Status: DC

## 2018-08-26 MED FILL — ACCU-CHEK SOFTCLIX LANCETS: 30 days supply | Qty: 100 | Fill #0

## 2018-08-26 NOTE — Patient Instructions (Signed)
Diabetes Mellitus and Nutrition When you have diabetes (diabetes mellitus), it is very important to have healthy eating habits because your blood sugar (glucose) levels are greatly affected by what you eat and drink. Eating healthy foods in the appropriate amounts, at about the same times every day, can help you:  Control your blood glucose.  Lower your risk of heart disease.  Improve your blood pressure.  Reach or maintain a healthy weight.  Every person with diabetes is different, and each person has different needs for a meal plan. Your health care provider may recommend that you work with a diet and nutrition specialist (dietitian) to make a meal plan that is best for you. Your meal plan may vary depending on factors such as:  The calories you need.  The medicines you take.  Your weight.  Your blood glucose, blood pressure, and cholesterol levels.  Your activity level.  Other health conditions you have, such as heart or kidney disease.  How do carbohydrates affect me? Carbohydrates affect your blood glucose level more than any other type of food. Eating carbohydrates naturally increases the amount of glucose in your blood. Carbohydrate counting is a method for keeping track of how many carbohydrates you eat. Counting carbohydrates is important to keep your blood glucose at a healthy level, especially if you use insulin or take certain oral diabetes medicines. It is important to know how many carbohydrates you can safely have in each meal. This is different for every person. Your dietitian can help you calculate how many carbohydrates you should have at each meal and for snack. Foods that contain carbohydrates include:  Bread, cereal, rice, pasta, and crackers.  Potatoes and corn.  Peas, beans, and lentils.  Milk and yogurt.  Fruit and juice.  Desserts, such as cakes, cookies, ice cream, and candy.  How does alcohol affect me? Alcohol can cause a sudden decrease in blood  glucose (hypoglycemia), especially if you use insulin or take certain oral diabetes medicines. Hypoglycemia can be a life-threatening condition. Symptoms of hypoglycemia (sleepiness, dizziness, and confusion) are similar to symptoms of having too much alcohol. If your health care provider says that alcohol is safe for you, follow these guidelines:  Limit alcohol intake to no more than 1 drink per day for nonpregnant women and 2 drinks per day for men. One drink equals 12 oz of beer, 5 oz of wine, or 1 oz of hard liquor.  Do not drink on an empty stomach.  Keep yourself hydrated with water, diet soda, or unsweetened iced tea.  Keep in mind that regular soda, juice, and other mixers may contain a lot of sugar and must be counted as carbohydrates.  What are tips for following this plan? Reading food labels  Start by checking the serving size on the label. The amount of calories, carbohydrates, fats, and other nutrients listed on the label are based on one serving of the food. Many foods contain more than one serving per package.  Check the total grams (g) of carbohydrates in one serving. You can calculate the number of servings of carbohydrates in one serving by dividing the total carbohydrates by 15. For example, if a food has 30 g of total carbohydrates, it would be equal to 2 servings of carbohydrates.  Check the number of grams (g) of saturated and trans fats in one serving. Choose foods that have low or no amount of these fats.  Check the number of milligrams (mg) of sodium in one serving. Most people   should limit total sodium intake to less than 2,300 mg per day.  Always check the nutrition information of foods labeled as "low-fat" or "nonfat". These foods may be higher in added sugar or refined carbohydrates and should be avoided.  Talk to your dietitian to identify your daily goals for nutrients listed on the label. Shopping  Avoid buying canned, premade, or processed foods. These  foods tend to be high in fat, sodium, and added sugar.  Shop around the outside edge of the grocery store. This includes fresh fruits and vegetables, bulk grains, fresh meats, and fresh dairy. Cooking  Use low-heat cooking methods, such as baking, instead of high-heat cooking methods like deep frying.  Cook using healthy oils, such as olive, canola, or sunflower oil.  Avoid cooking with butter, cream, or high-fat meats. Meal planning  Eat meals and snacks regularly, preferably at the same times every day. Avoid going long periods of time without eating.  Eat foods high in fiber, such as fresh fruits, vegetables, beans, and whole grains. Talk to your dietitian about how many servings of carbohydrates you can eat at each meal.  Eat 4-6 ounces of lean protein each day, such as lean meat, chicken, fish, eggs, or tofu. 1 ounce is equal to 1 ounce of meat, chicken, or fish, 1 egg, or 1/4 cup of tofu.  Eat some foods each day that contain healthy fats, such as avocado, nuts, seeds, and fish. Lifestyle   Check your blood glucose regularly.  Exercise at least 30 minutes 5 or more days each week, or as told by your health care provider.  Take medicines as told by your health care provider.  Do not use any products that contain nicotine or tobacco, such as cigarettes and e-cigarettes. If you need help quitting, ask your health care provider.  Work with a counselor or diabetes educator to identify strategies to manage stress and any emotional and social challenges. What are some questions to ask my health care provider?  Do I need to meet with a diabetes educator?  Do I need to meet with a dietitian?  What number can I call if I have questions?  When are the best times to check my blood glucose? Where to find more information:  American Diabetes Association: diabetes.org/food-and-fitness/food  Academy of Nutrition and Dietetics:  www.eatright.org/resources/health/diseases-and-conditions/diabetes  National Institute of Diabetes and Digestive and Kidney Diseases (NIH): www.niddk.nih.gov/health-information/diabetes/overview/diet-eating-physical-activity Summary  A healthy meal plan will help you control your blood glucose and maintain a healthy lifestyle.  Working with a diet and nutrition specialist (dietitian) can help you make a meal plan that is best for you.  Keep in mind that carbohydrates and alcohol have immediate effects on your blood glucose levels. It is important to count carbohydrates and to use alcohol carefully. This information is not intended to replace advice given to you by your health care provider. Make sure you discuss any questions you have with your health care provider. Document Released: 08/24/2005 Document Revised: 01/01/2017 Document Reviewed: 01/01/2017 Elsevier Interactive Patient Education  2018 Elsevier Inc.  

## 2018-08-26 NOTE — Progress Notes (Signed)
Subjective:  Patient ID: Paige Andrews, female    DOB: Nov 07, 1956  Age: 62 y.o. MRN: [id number]  CC: Abdominal pain  HPI Janney Priego is a 62 year old female with a history of type 2 diabetes mellitus (A1c 8.3) IBS, previous alcohol abuse, pancreatitis, asthma, GERD, Bipolar disorder, anxiety and depression who presents for a follow up visit. She has been out of her metformin for a few weeks.  Denies hypoglycemia, numbness in extremities or visual concerns and recently underwent eye surgery.  Her blood pressure is elevated and she has no known history of hypertension. Anxiety and depression have been uncontrolled on Cymbalta and hydroxyzine and she informs me she was on olanzapine in the past with improvement in her psych symptoms.  For the last 1 week she has had epigastric pain with associated vomiting and has also had some diarrhea; vomiting did not respond to the use of Protonix.  She has not had any vomiting this morning and denies history of sick contacts. Her insomnia is not controlled on Ambien she states that she would like to have something stronger as she sleeps for 1 hour and then she is awake.  On further questioning she is unaware of sleep hygiene denies drinking caffeinated products late in the day. For her low back pain she would like a referral for spine injections or to pain management as her back pain radiates down her right lower extremity.  It is rated as moderate at this time. She was recently approved for Medicaid and is currently being worked up for disability.  Past Medical History:  Diagnosis Date  . Anxiety   . Bipolar 1 disorder ([place])   . Celiac disease   . Depression   . Hypertension   . IBS (irritable bowel syndrome)     Past Surgical History:  Procedure Laterality Date  . carpel tunnel Right   . CESAREAN SECTION    . SHOULDER SURGERY Right     Allergies  Allergen Reactions  . Morphine And Related Anxiety    Patient states, " it makes me  feel like I  am crawling out of my skin"     Outpatient Medications Prior to Visit  Medication Sig Dispense Refill  . albuterol (PROVENTIL HFA;VENTOLIN HFA) 108 (90 Base) MCG/ACT inhaler Inhale 2 puffs into the lungs every 6 (six) hours as needed for wheezing or shortness of breath. 1 Inhaler 1  . Blood Glucose Monitoring Suppl (ACCU-CHEK AVIVA PLUS) w/Device KIT Use as instructed to test sugar once daily 1 kit 0  . cetirizine (ZYRTEC) 10 MG tablet Take 1 tablet (10 mg total) by mouth daily. 30 tablet 1  . ketorolac (ACULAR) 0.4 % SOLN 1 drop 4 (four) times daily.    . Lancets Misc. (ACCU-CHEK FASTCLIX LANCET) KIT Use as directed once daily 1 kit 0  . ofloxacin (OCUFLOX) 0.3 % ophthalmic solution 1 drop 4 (four) times daily.    . prednisoLONE acetate (PRED FORTE) 1 % ophthalmic suspension 1 drop 4 (four) times daily.    Marland Kitchen ACCU-CHEK SOFTCLIX LANCETS lancets Use as instructed to test sugar once daily 100 each 12  . baclofen (LIORESAL) 10 MG tablet Take 1 tablet (10 mg total) by mouth 3 (three) times daily. 90 each 3  . DULoxetine (CYMBALTA) 60 MG capsule Take 1 capsule (60 mg total) by mouth daily. 30 capsule 3  . gabapentin (NEURONTIN) 300 MG capsule Take 1 capsule (300 mg total) by mouth 2 (two) times daily. 60 capsule 3  .  metFORMIN (GLUCOPHAGE) 500 MG tablet Take 2 tablets (1,000 mg total) by mouth 2 (two) times daily with a meal. 120 tablet 3  . pantoprazole (PROTONIX) 40 MG tablet Take 1 tablet (40 mg total) by mouth daily. 60 tablet 3  . glucose blood (ACCU-CHEK AVIVA PLUS) test strip Use as instructed to test sugar once daily 100 each 12  . lidocaine (LIDODERM) 5 % Place 1 patch onto the skin daily. Remove & Discard patch within 12 hours or as directed by MD (Patient not taking: Reported on 04/03/2018) 30 patch 2  . meloxicam (MOBIC) 7.5 MG tablet Take 1 tablet (7.5 mg total) by mouth daily. (Patient not taking: Reported on 08/26/2018) 30 tablet 1  . polyethylene glycol (MIRALAX) packet Take 17 g by  mouth daily. (Patient not taking: Reported on 04/03/2018) 30 each 2  . hydrOXYzine (ATARAX/VISTARIL) 25 MG tablet Take 1 tablet (25 mg total) by mouth 3 (three) times daily as needed. 60 tablet 3  . zolpidem (AMBIEN) 5 MG tablet Take 1 tablet (5 mg total) by mouth at bedtime as needed for sleep. (Patient not taking: Reported on 04/03/2018) 30 tablet 3   No facility-administered medications prior to visit.     ROS Review of Systems  Constitutional: Negative for activity change, appetite change and fatigue.  HENT: Negative for congestion, sinus pressure and sore throat.   Eyes: Negative for visual disturbance.  Respiratory: Negative for cough, chest tightness, shortness of breath and wheezing.   Cardiovascular: Negative for chest pain and palpitations.  Gastrointestinal:       See hpi  Endocrine: Negative for polydipsia.  Genitourinary: Negative for dysuria and frequency.  Musculoskeletal: Positive for back pain. Negative for arthralgias.  Skin: Negative for rash.  Neurological: Negative for tremors, light-headedness and numbness.  Hematological: Does not bruise/bleed easily.  Psychiatric/Behavioral: Negative for agitation and behavioral problems.    Objective:  BP (!) 164/106   Pulse 79   Temp 98.2 F (36.8 C) (Oral)   Resp 18   Ht 5' 4"  (1.626 m)   Wt 170 lb 12.8 oz (77.5 kg)   SpO2 95%   BMI 29.32 kg/m   BP/Weight 08/26/2018 06/26/2018 05/29/5092  Systolic BP 267 124 580  Diastolic BP 998 84 84  Wt. (Lbs) 170.8 171 190.4  BMI 29.32 29.35 32.68     Physical Exam  Constitutional: She is oriented to person, place, and time. She appears well-developed and well-nourished.  Cardiovascular: Normal rate, normal heart sounds and intact distal pulses.  No murmur heard. Pulmonary/Chest: Effort normal and breath sounds normal. She has no wheezes. She has no rales. She exhibits no tenderness.  Abdominal: Soft. Bowel sounds are normal. She exhibits no distension and no mass. There is  no tenderness.  Musculoskeletal: Normal range of motion.  Neurological: She is alert and oriented to person, place, and time.  Skin: Skin is warm and dry.  Psychiatric: She has a normal mood and affect.    CMP Latest Ref Rng & Units 04/01/2018 04/01/2018 09/06/2017  Glucose 65 - 99 mg/dL 466(H) 483(H) 242(H)  BUN 6 - 20 mg/dL 25(H) 26(H) 13  Creatinine 0.44 - 1.00 mg/dL 1.12(H) 1.20(H) 0.77  Sodium 135 - 145 mmol/L 134(L) 134(L) 137  Potassium 3.5 - 5.1 mmol/L 4.2 4.0 4.3  Chloride 101 - 111 mmol/L 106 101 103  CO2 22 - 32 mmol/L 18(L) 17(L) 20  Calcium 8.9 - 10.3 mg/dL 9.2 10.6(H) 10.2  Total Protein 6.5 - 8.1 g/dL - 8.0 7.2  Total Bilirubin 0.3 - 1.2 mg/dL - 0.8 0.5  Alkaline Phos 38 - 126 U/L - 100 97  AST 15 - 41 U/L - 47(H) 27  ALT 14 - 54 U/L - 71(H) 38(H)    Lab Results  Component Value Date   HGBA1C 8.3 (A) 08/26/2018   HGBA1C 8.3 08/26/2018   HGBA1C 8.3 (A) 08/26/2018   HGBA1C 8.3 (A) 08/26/2018       Assessment & Plan:   1. Type 2 diabetes mellitus with hyperglycemia, without long-term current use of insulin (HCC) Controlled with A1c of 8.3 We will hold off on adjusting regimen as she had been out of metformin Counseled on Diabetic diet, my plate method, 338 minutes of moderate intensity exercise/week Keep blood sugar logs with fasting goals of 80-120 mg/dl, random of less than 180 and in the event of sugars less than 60 mg/dl or greater than 400 mg/dl please notify the clinic ASAP. It is recommended that you undergo annual eye exams and annual foot exams. Pneumonia vaccine is recommended. - Glucose (CBG) - Lipid panel; Future - Microalbumin/Creatinine Ratio, Urine; Future - metFORMIN (GLUCOPHAGE) 500 MG tablet; Take 2 tablets (1,000 mg total) by mouth 2 (two) times daily with a meal.  Dispense: 120 tablet; Refill: 3  2. Other insomnia Uncontrolled Discussed sleep hygiene including avoiding caffeinated products late in the day No change in Ambien dosing -  zolpidem (AMBIEN) 5 MG tablet; Take 1 tablet (5 mg total) by mouth at bedtime as needed for sleep.  Dispense: 30 tablet; Refill: 3  3. Gastroesophageal reflux disease with esophagitis Pylori breath test was negative in 03/2017 Would love to check Helicobacter pylori but given she recently took PPI, I will hold off. - pantoprazole (PROTONIX) 40 MG tablet; Take 1 tablet (40 mg total) by mouth daily.  Dispense: 60 tablet; Refill: 3  4. Anxiety and depression Uncontrolled She does have an underlying history of bipolar disorder - Ambulatory referral to Psychiatry - hydrOXYzine (ATARAX/VISTARIL) 25 MG tablet; Take 1 tablet (25 mg total) by mouth 3 (three) times daily as needed.  Dispense: 60 tablet; Refill: 3 - DULoxetine (CYMBALTA) 60 MG capsule; Take 1 capsule (60 mg total) by mouth daily.  Dispense: 30 capsule; Refill: 3  5. Chronic right-sided low back pain with right-sided sciatica Uncontrolled - Ambulatory referral to Physical Medicine Rehab - gabapentin (NEURONTIN) 300 MG capsule; Take 1 capsule (300 mg total) by mouth 2 (two) times daily.  Dispense: 60 capsule; Refill: 3 - baclofen (LIORESAL) 10 MG tablet; Take 1 tablet (10 mg total) by mouth 3 (three) times daily.  Dispense: 90 each; Refill: 3  6. Gastroenteritis Placed on promethazine Increase fluid intake, BRAT diet  7. Screening for viral disease - HIV antibody (with reflex); Future - Hepatitis c antibody (reflex); Future  8.  Hypertension Newly diagnosed Commenced on lisinopril Counseled on blood pressure goal of less than 130/80, low-sodium, DASH diet, medication compliance, 150 minutes of moderate intensity exercise per week. Discussed medication compliance, adverse effects.  Meds ordered this encounter  Medications  . lisinopril (PRINIVIL,ZESTRIL) 5 MG tablet    Sig: Take 1 tablet (5 mg total) by mouth daily.    Dispense:  30 tablet    Refill:  3  . promethazine (PHENERGAN) 25 MG tablet    Sig: Take 1 tablet (25 mg  total) by mouth every 8 (eight) hours as needed for nausea or vomiting.    Dispense:  20 tablet    Refill:  0  . atorvastatin (  LIPITOR) 20 MG tablet    Sig: Take 1 tablet (20 mg total) by mouth daily.    Dispense:  30 tablet    Refill:  3  . zolpidem (AMBIEN) 5 MG tablet    Sig: Take 1 tablet (5 mg total) by mouth at bedtime as needed for sleep.    Dispense:  30 tablet    Refill:  3  . pantoprazole (PROTONIX) 40 MG tablet    Sig: Take 1 tablet (40 mg total) by mouth daily.    Dispense:  60 tablet    Refill:  3  . metFORMIN (GLUCOPHAGE) 500 MG tablet    Sig: Take 2 tablets (1,000 mg total) by mouth 2 (two) times daily with a meal.    Dispense:  120 tablet    Refill:  3  . hydrOXYzine (ATARAX/VISTARIL) 25 MG tablet    Sig: Take 1 tablet (25 mg total) by mouth 3 (three) times daily as needed.    Dispense:  60 tablet    Refill:  3  . gabapentin (NEURONTIN) 300 MG capsule    Sig: Take 1 capsule (300 mg total) by mouth 2 (two) times daily.    Dispense:  60 capsule    Refill:  3  . DULoxetine (CYMBALTA) 60 MG capsule    Sig: Take 1 capsule (60 mg total) by mouth daily.    Dispense:  30 capsule    Refill:  3  . baclofen (LIORESAL) 10 MG tablet    Sig: Take 1 tablet (10 mg total) by mouth 3 (three) times daily.    Dispense:  90 each    Refill:  3    Follow-up: Return in about 6 weeks (around 10/07/2018) for Complete physical exam.   Charlott Rakes MD

## 2018-08-26 NOTE — Progress Notes (Signed)
Flu shot: already got  Pain: stomach weeks, vomiting a couple times this week. Patient stated that she took protonix and as soon as she took it she vomited  Patient stated Remus Lofflerambien is not strong enough

## 2018-08-27 ENCOUNTER — Telehealth: Payer: Self-pay

## 2018-08-27 ENCOUNTER — Other Ambulatory Visit: Payer: Self-pay | Admitting: Family Medicine

## 2018-08-27 DIAGNOSIS — G4709 Other insomnia: Secondary | ICD-10-CM

## 2018-08-27 MED ORDER — ZOLPIDEM TARTRATE 5 MG PO TABS: 5.0000 mg | ORAL_TABLET | Freq: Every evening | ORAL | 3 refills | Status: DC | PRN

## 2018-08-27 NOTE — Telephone Encounter (Signed)
Done

## 2018-08-27 NOTE — Telephone Encounter (Signed)
Pt [facility] RX [facility]pidem 5mg  [facility] [address]

## 2018-08-27 NOTE — Telephone Encounter (Signed)
-----   Message from Hoy RegisterEnobong Newlin, MD sent at 08/27/2018  1:13 PM EDT ----- Regarding: RE: Zolpidem RX [facility]  Could you please find out her preferred pharmacy?  Thank you, EN ----- Message ----- From: Lou CalFrantz, Marise Knapper G, CPhT Sent: 08/26/2018   4:46 PM EDT To: Hoy RegisterEnobong Newlin, MD Subject: Zolpidem RX                                    This patient's RX for zolpidem was sent to our pharmacy by accident. Can you please resend it to the patients preferred outside pharmacy, since we can't fill it here. Thanks!

## 2018-09-02 ENCOUNTER — Ambulatory Visit: Payer: Medicaid Other | Attending: Family Medicine

## 2018-09-02 DIAGNOSIS — Z1159 Encounter for screening for other viral diseases: Secondary | ICD-10-CM | POA: Diagnosis present

## 2018-09-02 DIAGNOSIS — E1165 Type 2 diabetes mellitus with hyperglycemia: Secondary | ICD-10-CM

## 2018-09-02 NOTE — Progress Notes (Signed)
Patient here for lab visit only 

## 2018-09-03 ENCOUNTER — Other Ambulatory Visit: Payer: Self-pay | Admitting: Family Medicine

## 2018-09-03 LAB — HIV ANTIBODY (ROUTINE TESTING W REFLEX): HIV Screen 4th Generation wRfx: NONREACTIVE

## 2018-09-03 LAB — LIPID PANEL
Chol/HDL Ratio: 4.5 ratio — ABNORMAL HIGH (ref 0.0–4.4)
Cholesterol, Total: 218 mg/dL — ABNORMAL HIGH (ref 100–199)
HDL: 48 mg/dL (ref >39)
LDL Calculated: 143 mg/dL — ABNORMAL HIGH (ref 0–99)
Triglycerides: 137 mg/dL (ref 0–149)
VLDL Cholesterol Cal: 27 mg/dL (ref 5–40)

## 2018-09-03 LAB — HEPATITIS C ANTIBODY (REFLEX)

## 2018-09-03 LAB — HCV COMMENT:

## 2018-09-03 MED ORDER — ATORVASTATIN CALCIUM 40 MG PO TABS: 40.0000 mg | ORAL_TABLET | Freq: Every day | ORAL | 3 refills | Status: DC

## 2018-09-04 MED FILL — ATORVASTATIN 40 MG TABLET: 40 | 30 days supply | Qty: 30 | Fill #0

## 2018-09-05 ENCOUNTER — Ambulatory Visit: Payer: Medicaid Other | Admitting: Physical Medicine & Rehabilitation

## 2018-09-05 ENCOUNTER — Encounter: Payer: Medicaid Other | Attending: Physical Medicine & Rehabilitation

## 2018-09-05 ENCOUNTER — Encounter: Payer: Self-pay | Admitting: Physical Medicine & Rehabilitation

## 2018-09-05 VITALS — BP 132/91 | HR 97 | Ht 64.0 in | Wt 171.2 lb

## 2018-09-05 DIAGNOSIS — K589 Irritable bowel syndrome without diarrhea: Secondary | ICD-10-CM | POA: Diagnosis not present

## 2018-09-05 DIAGNOSIS — M25561 Pain in right knee: Secondary | ICD-10-CM | POA: Insufficient documentation

## 2018-09-05 DIAGNOSIS — M25551 Pain in right hip: Secondary | ICD-10-CM | POA: Insufficient documentation

## 2018-09-05 DIAGNOSIS — M25552 Pain in left hip: Secondary | ICD-10-CM | POA: Diagnosis not present

## 2018-09-05 DIAGNOSIS — G5701 Lesion of sciatic nerve, right lower limb: Secondary | ICD-10-CM

## 2018-09-05 DIAGNOSIS — M545 Low back pain, unspecified: Secondary | ICD-10-CM

## 2018-09-05 DIAGNOSIS — Z7984 Long term (current) use of oral hypoglycemic drugs: Secondary | ICD-10-CM | POA: Diagnosis not present

## 2018-09-05 DIAGNOSIS — M542 Cervicalgia: Secondary | ICD-10-CM | POA: Insufficient documentation

## 2018-09-05 DIAGNOSIS — Z79899 Other long term (current) drug therapy: Secondary | ICD-10-CM | POA: Insufficient documentation

## 2018-09-05 DIAGNOSIS — F319 Bipolar disorder, unspecified: Secondary | ICD-10-CM | POA: Diagnosis not present

## 2018-09-05 DIAGNOSIS — K900 Celiac disease: Secondary | ICD-10-CM | POA: Insufficient documentation

## 2018-09-05 DIAGNOSIS — K860 Alcohol-induced chronic pancreatitis: Secondary | ICD-10-CM | POA: Diagnosis not present

## 2018-09-05 DIAGNOSIS — G8929 Other chronic pain: Secondary | ICD-10-CM | POA: Diagnosis present

## 2018-09-05 DIAGNOSIS — F419 Anxiety disorder, unspecified: Secondary | ICD-10-CM | POA: Diagnosis not present

## 2018-09-05 DIAGNOSIS — R1031 Right lower quadrant pain: Secondary | ICD-10-CM | POA: Insufficient documentation

## 2018-09-05 DIAGNOSIS — M549 Dorsalgia, unspecified: Secondary | ICD-10-CM

## 2018-09-05 DIAGNOSIS — E119 Type 2 diabetes mellitus without complications: Secondary | ICD-10-CM | POA: Insufficient documentation

## 2018-09-05 DIAGNOSIS — I10 Essential (primary) hypertension: Secondary | ICD-10-CM | POA: Diagnosis not present

## 2018-09-05 MED FILL — KETOROLAC 0.4% OPHTH SOLN: 0.4 | 30 days supply | Qty: 5 | Fill #0

## 2018-09-05 MED FILL — PREDNISOLONE AC 1% EYE DROP: 1 | 30 days supply | Qty: 10 | Fill #0

## 2018-09-05 MED FILL — LATANOPROST 0.005% EYE DRP: 0.005 | 18 days supply | Qty: 3 | Fill #1

## 2018-09-05 MED FILL — OFLOXACIN 0.3% EYE DROPS: 0.3 | 30 days supply | Qty: 5 | Fill #0

## 2018-09-05 NOTE — Patient Instructions (Signed)
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
## 2018-09-06 ENCOUNTER — Telehealth: Payer: Self-pay

## 2018-09-06 ENCOUNTER — Telehealth: Payer: Self-pay | Admitting: Family Medicine

## 2018-09-06 NOTE — Addendum Note (Signed)
Addended by: Octaviano Glow on: 09/06/2018 01:19 PM   Modules accepted: Orders

## 2018-09-06 NOTE — Telephone Encounter (Signed)
Patient was called and informed of lab results via voicemail. 

## 2018-09-06 NOTE — Telephone Encounter (Signed)
-----   Message from Hoy Register, MD sent at 09/03/2018  6:08 PM EDT ----- Cholesterol is elevated.  Please advise I have increased atorvastatin to 40 mg.  Hepatitis C, HIV test are negative.

## 2018-09-07 LAB — MICROALBUMIN / CREATININE URINE RATIO
Creatinine, Urine: 97.2 mg/dL
MICROALB/CREAT RATIO: 29.9 mg/g{creat} (ref 0.0–30.0)
Microalbumin, Urine: 29.1 ug/mL

## 2018-09-09 ENCOUNTER — Telehealth: Payer: Self-pay

## 2018-09-09 NOTE — Telephone Encounter (Signed)
Call made to Paige Andrews to give her the results of her urine test on behalf of Dr Alvis Lemmings as seen below:    Notes recorded by Hoy Register, MD on 09/09/2018 at 7:39 AM EDT Urine test is normal   She demonstrated that she understood that the test was normal and that she has an MD appt on 10/16/18 that she plans to attend.  Pt denies any questions at this time.

## 2018-09-16 ENCOUNTER — Ambulatory Visit
Admission: RE | Admit: 2018-09-16 | Discharge: 2018-09-16 | Disposition: A | Discharge status: Home / Self Care | Payer: Medicaid Other | Source: Ambulatory Visit | Attending: Physical Medicine & Rehabilitation | Admitting: Physical Medicine & Rehabilitation

## 2018-09-16 ENCOUNTER — Other Ambulatory Visit: Payer: Self-pay | Admitting: Physical Medicine & Rehabilitation

## 2018-09-16 DIAGNOSIS — G8929 Other chronic pain: Secondary | ICD-10-CM

## 2018-09-16 DIAGNOSIS — M25552 Pain in left hip: Principal | ICD-10-CM

## 2018-09-16 DIAGNOSIS — M25551 Pain in right hip: Principal | ICD-10-CM

## 2018-09-16 DIAGNOSIS — M545 Low back pain: Secondary | ICD-10-CM

## 2018-09-16 DIAGNOSIS — M549 Dorsalgia, unspecified: Secondary | ICD-10-CM

## 2018-09-16 DIAGNOSIS — M542 Cervicalgia: Secondary | ICD-10-CM

## 2018-09-16 MED FILL — metFORMIN HCL 500 MG TABS: 500 | 30 days supply | Qty: 120 | Fill #1

## 2018-09-16 MED FILL — PANTOPRAZOLE SOD DR 40 MG T: 40 | 30 days supply | Qty: 30 | Fill #1

## 2018-09-16 MED FILL — BACLOFEN 10 MG TABLET: 10 | 30 days supply | Qty: 90 | Fill #1

## 2018-09-20 MED FILL — PROMETHAZINE 25 MG TABLET: 25 | 7 days supply | Qty: 20 | Fill #0

## 2018-09-20 MED FILL — LISINOPRIL 5 MG TAB: 5 | 30 days supply | Qty: 30 | Fill #0

## 2018-09-20 MED FILL — ATORVASTATIN 20 MG TABLET: 20 | 30 days supply | Qty: 30 | Fill #0

## 2018-09-20 MED FILL — hydrOXYzine HCL 25 MG TABS: 25 | 20 days supply | Qty: 60 | Fill #0

## 2018-09-23 ENCOUNTER — Ambulatory Visit: Payer: Medicaid Other | Attending: Family Medicine | Admitting: Licensed Clinical Social Worker

## 2018-09-23 DIAGNOSIS — F332 Major depressive disorder, recurrent severe without psychotic features: Secondary | ICD-10-CM | POA: Diagnosis not present

## 2018-09-23 DIAGNOSIS — F419 Anxiety disorder, unspecified: Secondary | ICD-10-CM
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
## 2018-09-27 ENCOUNTER — Telehealth: Payer: Self-pay | Admitting: Licensed Clinical Social Worker

## 2018-09-27 NOTE — Telephone Encounter (Signed)
A completed referral to [facility] was faxed to [phone] 

## 2018-10-03 ENCOUNTER — Encounter: Payer: Self-pay | Admitting: Physical Medicine & Rehabilitation

## 2018-10-03 ENCOUNTER — Encounter: Payer: Medicaid Other | Attending: Physical Medicine & Rehabilitation

## 2018-10-03 ENCOUNTER — Ambulatory Visit: Payer: Medicaid Other | Admitting: Physical Medicine & Rehabilitation

## 2018-10-03 VITALS — BP 128/90 | HR 91 | Resp 14 | Ht 64.0 in | Wt 172.0 lb

## 2018-10-03 DIAGNOSIS — M7918 Myalgia, other site: Secondary | ICD-10-CM

## 2018-10-03 DIAGNOSIS — M533 Sacrococcygeal disorders, not elsewhere classified: Secondary | ICD-10-CM | POA: Diagnosis not present

## 2018-10-03 DIAGNOSIS — K860 Alcohol-induced chronic pancreatitis: Secondary | ICD-10-CM | POA: Insufficient documentation

## 2018-10-03 DIAGNOSIS — F419 Anxiety disorder, unspecified: Secondary | ICD-10-CM | POA: Insufficient documentation

## 2018-10-03 DIAGNOSIS — E119 Type 2 diabetes mellitus without complications: Secondary | ICD-10-CM | POA: Diagnosis not present

## 2018-10-03 DIAGNOSIS — G8929 Other chronic pain: Secondary | ICD-10-CM | POA: Insufficient documentation

## 2018-10-03 DIAGNOSIS — M25561 Pain in right knee: Secondary | ICD-10-CM | POA: Insufficient documentation

## 2018-10-03 DIAGNOSIS — Z7984 Long term (current) use of oral hypoglycemic drugs: Secondary | ICD-10-CM | POA: Insufficient documentation

## 2018-10-03 DIAGNOSIS — K589 Irritable bowel syndrome without diarrhea: Secondary | ICD-10-CM | POA: Diagnosis not present

## 2018-10-03 DIAGNOSIS — K900 Celiac disease: Secondary | ICD-10-CM | POA: Diagnosis not present

## 2018-10-03 DIAGNOSIS — F319 Bipolar disorder, unspecified: Secondary | ICD-10-CM | POA: Insufficient documentation

## 2018-10-03 DIAGNOSIS — M542 Cervicalgia: Secondary | ICD-10-CM | POA: Diagnosis not present

## 2018-10-03 DIAGNOSIS — G5701 Lesion of sciatic nerve, right lower limb: Secondary | ICD-10-CM | POA: Diagnosis not present

## 2018-10-03 DIAGNOSIS — Z79899 Other long term (current) drug therapy: Secondary | ICD-10-CM | POA: Diagnosis not present

## 2018-10-03 DIAGNOSIS — M545 Low back pain: Secondary | ICD-10-CM | POA: Insufficient documentation

## 2018-10-03 DIAGNOSIS — M25552 Pain in left hip: Secondary | ICD-10-CM | POA: Insufficient documentation

## 2018-10-03 DIAGNOSIS — M25551 Pain in right hip: Secondary | ICD-10-CM | POA: Diagnosis not present

## 2018-10-03 DIAGNOSIS — R1031 Right lower quadrant pain: Secondary | ICD-10-CM | POA: Insufficient documentation

## 2018-10-03 DIAGNOSIS — I10 Essential (primary) hypertension: Secondary | ICD-10-CM | POA: Insufficient documentation
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
## 2018-10-04 MED FILL — LATANOPROST 0.005% EYE DRP: 0.005 | 18 days supply | Qty: 3 | Fill #2

## 2018-10-07 ENCOUNTER — Ambulatory Visit: Payer: Self-pay | Admitting: Pharmacist

## 2018-10-09 ENCOUNTER — Ambulatory Visit: Payer: Self-pay | Admitting: Pharmacist

## 2018-10-10 ENCOUNTER — Ambulatory Visit: Payer: Medicaid Other | Attending: Family Medicine | Admitting: Pharmacist

## 2018-10-10 ENCOUNTER — Encounter: Payer: Self-pay | Admitting: Pharmacist

## 2018-10-10 DIAGNOSIS — Z7189 Other specified counseling: Secondary | ICD-10-CM

## 2018-10-10 NOTE — Progress Notes (Signed)
Patient presented for medication counseling. She reports interest in re-starting olanzapine. Will forward information to patient's PCP. All questions and concerns were addressed.

## 2018-10-10 NOTE — Patient Instructions (Signed)
Thank you for coming to see me today.   Please remember to talk to Dr. Margarita Rana about a behavioral health referral for possibility of starting olanzapine.   Remember blood sugar goals:  Fasting: 80 - 130 Random/2 hours after eating: less than 180  Continue to take metformin 500 mg: 2 tablets 2 times a day.   If your cough does not improve or you continue to experience dizziness, please mention this to Dr. Margarita Rana. She will be able to switch your to another medication.

## 2018-10-16 ENCOUNTER — Other Ambulatory Visit (HOSPITAL_COMMUNITY)
Admission: RE | Admit: 2018-10-16 | Discharge: 2018-10-16 | Disposition: A | Discharge status: Home / Self Care | Payer: Medicaid Other | Source: Ambulatory Visit | Attending: Family Medicine | Admitting: Family Medicine

## 2018-10-16 ENCOUNTER — Ambulatory Visit: Payer: Medicaid Other | Attending: Family Medicine | Admitting: Family Medicine

## 2018-10-16 ENCOUNTER — Encounter: Payer: Self-pay | Admitting: Family Medicine

## 2018-10-16 VITALS — BP 124/81 | HR 107 | Temp 98.0°F | Ht 64.0 in | Wt 168.8 lb

## 2018-10-16 DIAGNOSIS — Z124 Encounter for screening for malignant neoplasm of cervix: Secondary | ICD-10-CM | POA: Diagnosis not present

## 2018-10-16 DIAGNOSIS — F319 Bipolar disorder, unspecified: Secondary | ICD-10-CM | POA: Insufficient documentation

## 2018-10-16 DIAGNOSIS — R000 Tachycardia, unspecified: Secondary | ICD-10-CM

## 2018-10-16 DIAGNOSIS — R61 Generalized hyperhidrosis: Secondary | ICD-10-CM | POA: Diagnosis not present

## 2018-10-16 DIAGNOSIS — I10 Essential (primary) hypertension: Secondary | ICD-10-CM | POA: Insufficient documentation

## 2018-10-16 DIAGNOSIS — R42 Dizziness and giddiness: Secondary | ICD-10-CM | POA: Diagnosis not present

## 2018-10-16 DIAGNOSIS — Z885 Allergy status to narcotic agent status: Secondary | ICD-10-CM | POA: Insufficient documentation

## 2018-10-16 DIAGNOSIS — Z0001 Encounter for general adult medical examination with abnormal findings: Secondary | ICD-10-CM | POA: Insufficient documentation

## 2018-10-16 DIAGNOSIS — F1011 Alcohol abuse, in remission: Secondary | ICD-10-CM | POA: Insufficient documentation

## 2018-10-16 DIAGNOSIS — J45909 Unspecified asthma, uncomplicated: Secondary | ICD-10-CM | POA: Insufficient documentation

## 2018-10-16 DIAGNOSIS — J328 Other chronic sinusitis: Secondary | ICD-10-CM

## 2018-10-16 DIAGNOSIS — Z0000 Encounter for general adult medical examination without abnormal findings: Secondary | ICD-10-CM

## 2018-10-16 DIAGNOSIS — J329 Chronic sinusitis, unspecified: Secondary | ICD-10-CM | POA: Diagnosis not present

## 2018-10-16 DIAGNOSIS — E1165 Type 2 diabetes mellitus with hyperglycemia: Secondary | ICD-10-CM | POA: Diagnosis not present

## 2018-10-16 DIAGNOSIS — Z1239 Encounter for other screening for malignant neoplasm of breast: Secondary | ICD-10-CM

## 2018-10-16 DIAGNOSIS — Z9889 Other specified postprocedural states: Secondary | ICD-10-CM | POA: Diagnosis not present

## 2018-10-16 DIAGNOSIS — Z79899 Other long term (current) drug therapy: Secondary | ICD-10-CM | POA: Diagnosis not present

## 2018-10-16 DIAGNOSIS — F419 Anxiety disorder, unspecified: Secondary | ICD-10-CM | POA: Insufficient documentation

## 2018-10-16 DIAGNOSIS — K219 Gastro-esophageal reflux disease without esophagitis: Secondary | ICD-10-CM | POA: Diagnosis not present

## 2018-10-16 DIAGNOSIS — Z7984 Long term (current) use of oral hypoglycemic drugs: Secondary | ICD-10-CM | POA: Insufficient documentation

## 2018-10-16 DIAGNOSIS — Z1211 Encounter for screening for malignant neoplasm of colon: Secondary | ICD-10-CM

## 2018-10-16 LAB — GLUCOSE, POCT (MANUAL RESULT ENTRY): POC GLUCOSE: 136 mg/dL — AB (ref 70–99)

## 2018-10-16 MED ORDER — FLUTICASONE PROPIONATE 50 MCG/ACT NA SUSP: 2.0000 | Freq: Every day | NASAL | 1 refills | Status: DC

## 2018-10-16 MED ORDER — MECLIZINE HCL 25 MG PO TABS: 25.0000 mg | ORAL_TABLET | Freq: Three times a day (TID) | ORAL | 1 refills | Status: DC | PRN

## 2018-10-16 MED ORDER — ALUMINUM CHLORIDE 20 % EX SOLN: Freq: Every day | CUTANEOUS | 1 refills | Status: DC

## 2018-10-16 NOTE — Addendum Note (Signed)
Addended byHoy Register on: 10/16/2018 04:07 PM   Modules accepted: Orders

## 2018-10-16 NOTE — Progress Notes (Signed)
Pain in neck, back,hips and feet.

## 2018-10-16 NOTE — Addendum Note (Signed)
Addended by: Ronette Deter on: 10/16/2018 04:01 PM   Modules accepted: Orders

## 2018-10-16 NOTE — Patient Instructions (Signed)

## 2018-10-16 NOTE — Progress Notes (Addendum)
Subjective:  Patient ID: Paige Andrews, female    DOB: 1956/07/19  Age: 62 y.o. MRN: [id number]  CC: Annual Exam and Gynecologic Exam   HPI Paige Andrews  is a 62 year old female with a history of type 2 diabetes mellitus (A1c 8.3) IBS, previous alcohol abuse, pancreatitis, asthma, GERD, Bipolar disorder, anxiety and depression who presents for a complete physical exam. She complains of dizziness with change of position and was diagnosed with BPPV in the past. Also complains of cough which is worse early in the mornings and at night with persistent postnasal drip.  Also complains of excessive sweating which she has suffered from all her life and states this is not related to menopause. She was seen by Dr. Letta Pate of rehab last month for chronic neck, back, hip pain and was diagnosed with piriformis syndrome, Sacro Iliac joint dysfunction and received a trigger point injection in her neck.  She has been referred for PT  Past Medical History:  Diagnosis Date  . Anxiety   . Bipolar 1 disorder ([place])   . Celiac disease   . Depression   . Hypertension   . IBS (irritable bowel syndrome)     Past Surgical History:  Procedure Laterality Date  . carpel tunnel Right   . CESAREAN SECTION    . SHOULDER SURGERY Right     Allergies  Allergen Reactions  . Morphine And Related Anxiety    Patient states, " it makes me  feel like I am crawling out of my skin"     Outpatient Medications Prior to Visit  Medication Sig Dispense Refill  . ACCU-CHEK SOFTCLIX LANCETS lancets Use as instructed to test sugar once daily 100 each 12  . albuterol (PROVENTIL HFA;VENTOLIN HFA) 108 (90 Base) MCG/ACT inhaler Inhale 2 puffs into the lungs every 6 (six) hours as needed for wheezing or shortness of breath. 1 Inhaler 1  . atorvastatin (LIPITOR) 40 MG tablet Take 1 tablet (40 mg total) by mouth daily. 30 tablet 3  . baclofen (LIORESAL) 10 MG tablet Take 1 tablet (10 mg total) by mouth 3 (three) times daily. 90  each 3  . Blood Glucose Monitoring Suppl (ACCU-CHEK AVIVA PLUS) w/Device KIT Use as instructed to test sugar once daily 1 kit 0  . cetirizine (ZYRTEC) 10 MG tablet Take 1 tablet (10 mg total) by mouth daily. 30 tablet 1  . DULoxetine (CYMBALTA) 60 MG capsule Take 1 capsule (60 mg total) by mouth daily. 30 capsule 3  . gabapentin (NEURONTIN) 300 MG capsule Take 1 capsule (300 mg total) by mouth 2 (two) times daily. 60 capsule 3  . glucose blood (ACCU-CHEK AVIVA PLUS) test strip Use as instructed to test sugar once daily 100 each 12  . hydrOXYzine (ATARAX/VISTARIL) 25 MG tablet Take 1 tablet (25 mg total) by mouth 3 (three) times daily as needed. 60 tablet 3  . Lancets Misc. (ACCU-CHEK FASTCLIX LANCET) KIT Use as directed once daily 1 kit 0  . lisinopril (PRINIVIL,ZESTRIL) 5 MG tablet Take 1 tablet (5 mg total) by mouth daily. 30 tablet 3  . metFORMIN (GLUCOPHAGE) 500 MG tablet Take 2 tablets (1,000 mg total) by mouth 2 (two) times daily with a meal. 120 tablet 3  . pantoprazole (PROTONIX) 40 MG tablet Take 1 tablet (40 mg total) by mouth daily. 60 tablet 3  . ketorolac (ACULAR) 0.4 % SOLN 1 drop 4 (four) times daily.    Marland Kitchen ofloxacin (OCUFLOX) 0.3 % ophthalmic solution 1 drop 4 (four)  times daily.    . prednisoLONE acetate (PRED FORTE) 1 % ophthalmic suspension 1 drop 4 (four) times daily.     No facility-administered medications prior to visit.     ROS Review of Systems  Constitutional: Negative for activity change, appetite change and fatigue.  HENT: Positive for postnasal drip. Negative for congestion, sinus pressure and sore throat.   Eyes: Negative for visual disturbance.  Respiratory: Positive for cough. Negative for chest tightness, shortness of breath and wheezing.   Cardiovascular: Negative for chest pain and palpitations.  Gastrointestinal: Negative for abdominal distention, abdominal pain and constipation.  Endocrine: Negative for polydipsia.  Genitourinary: Negative for dysuria  and frequency.  Musculoskeletal:       See hpi  Skin: Negative for rash.  Neurological: Positive for dizziness. Negative for tremors, light-headedness and numbness.  Hematological: Does not bruise/bleed easily.  Psychiatric/Behavioral: Negative for agitation and behavioral problems.    Objective:  BP 124/81   Pulse (!) 107   Temp 98 F (36.7 C) (Oral)   Ht '5\' 4"'$  (1.626 m)   Wt 168 lb 12.8 oz (76.6 kg)   SpO2 96%   BMI 28.97 kg/m   BP/Weight 10/16/2018 10/03/2018 02/03/8249  Systolic BP 037 048 889  Diastolic BP 81 90 91  Wt. (Lbs) 168.8 172 171.2  BMI 28.97 29.52 29.39      Physical Exam  Constitutional: She is oriented to person, place, and time. She appears well-developed and well-nourished. No distress.  HENT:  Head: Normocephalic.  Right Ear: External ear normal.  Left Ear: External ear normal.  Nose: Nose normal.  Mouth/Throat: Oropharynx is clear and moist.  Eyes: Pupils are equal, round, and reactive to light. Conjunctivae and EOM are normal.  Neck: Normal range of motion. No JVD present.  Cardiovascular: Regular rhythm, normal heart sounds and intact distal pulses. Tachycardia present. Exam reveals no gallop.  No murmur heard. Pulmonary/Chest: Effort normal and breath sounds normal. No respiratory distress. She has no wheezes. She has no rales. She exhibits no tenderness. Right breast exhibits no mass, no skin change and no tenderness. Left breast exhibits no mass, no skin change (superficial papule <0.5cm, not tender) and no tenderness.  Abdominal: Soft. Bowel sounds are normal. She exhibits no distension and no mass. There is no tenderness.  Genitourinary:  Genitourinary Comments: External genitalia, vagina, cervix, adnexa-normal  Musculoskeletal: Normal range of motion. She exhibits no edema or tenderness.  Neurological: She is alert and oriented to person, place, and time. She has normal reflexes.  Skin: Skin is warm and dry. She is not diaphoretic.    Psychiatric: Her mood appears anxious.     Assessment & Plan:   1. Annual physical exam Counseled on 150 minutes of exercise per week, healthy eating (including decreased daily intake of saturated fats, cholesterol, added sugars, sodium), STI prevention, routine healthcare maintenance. - Ambulatory referral to Gastroenterology  2. Type 2 diabetes mellitus with hyperglycemia, without long-term current use of insulin (HCC) - POCT glucose (manual entry)  3. Screening for breast cancer - MM Digital Screening; Future  4. Screening for cervical cancer - Cytology - PAP([place])  5. Tachycardia Left to place on metoprolol which she declines Thyroid labs normal  6. Screening for colon cancer Referred for colonoscopy  7. Other chronic sinusitis - fluticasone (FLONASE) 50 MCG/ACT nasal spray; Place 2 sprays into both nostrils daily.  Dispense: 16 g; Refill: 1  8. Vertigo - meclizine (ANTIVERT) 25 MG tablet; Take 1 tablet (25 mg total) by  mouth 3 (three) times daily as needed for dizziness.  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 meclizine (ANTIVERT) 25 MG tablet    Sig: Take 1 tablet (25 mg total) by mouth 3 (three) times daily as needed for dizziness.    Dispense:  60 tablet    Refill:  1  . fluticasone (FLONASE) 50 MCG/ACT nasal spray    Sig: Place 2 sprays into both nostrils daily.    Dispense:  16 g    Refill:  1    Follow-up: Return in about 3 months (around 01/16/2019) for Follow-up of chronic medical conditions.   Charlott Rakes MD

## 2018-10-18 ENCOUNTER — Ambulatory Visit: Payer: Medicaid Other

## 2018-10-18 LAB — CYTOLOGY - PAP
Diagnosis: NEGATIVE
HPV: NOT DETECTED

## 2018-10-21 ENCOUNTER — Telehealth: Payer: Self-pay

## 2018-10-21 NOTE — Telephone Encounter (Signed)
-----   Message from Hoy Register, MD sent at 10/21/2018  9:42 AM EST ----- PAP smear is negative for malignancy

## 2018-10-21 NOTE — Telephone Encounter (Signed)
Patient was called and informed of lab results. 

## 2018-10-22 ENCOUNTER — Ambulatory Visit (HOSPITAL_BASED_OUTPATIENT_CLINIC_OR_DEPARTMENT_OTHER): Payer: Medicaid Other | Admitting: Physical Medicine & Rehabilitation

## 2018-10-22 ENCOUNTER — Encounter: Payer: Medicaid Other | Attending: Physical Medicine & Rehabilitation

## 2018-10-22 VITALS — BP 109/77 | HR 109

## 2018-10-22 DIAGNOSIS — F419 Anxiety disorder, unspecified: Secondary | ICD-10-CM | POA: Insufficient documentation

## 2018-10-22 DIAGNOSIS — F319 Bipolar disorder, unspecified: Secondary | ICD-10-CM | POA: Diagnosis not present

## 2018-10-22 DIAGNOSIS — G5701 Lesion of sciatic nerve, right lower limb: Secondary | ICD-10-CM | POA: Insufficient documentation

## 2018-10-22 DIAGNOSIS — M533 Sacrococcygeal disorders, not elsewhere classified: Secondary | ICD-10-CM

## 2018-10-22 DIAGNOSIS — M25552 Pain in left hip: Secondary | ICD-10-CM | POA: Insufficient documentation

## 2018-10-22 DIAGNOSIS — M542 Cervicalgia: Secondary | ICD-10-CM | POA: Insufficient documentation

## 2018-10-22 DIAGNOSIS — M25551 Pain in right hip: Secondary | ICD-10-CM | POA: Diagnosis not present

## 2018-10-22 DIAGNOSIS — Z7984 Long term (current) use of oral hypoglycemic drugs: Secondary | ICD-10-CM | POA: Diagnosis not present

## 2018-10-22 DIAGNOSIS — M25561 Pain in right knee: Secondary | ICD-10-CM | POA: Diagnosis not present

## 2018-10-22 DIAGNOSIS — I10 Essential (primary) hypertension: Secondary | ICD-10-CM | POA: Insufficient documentation

## 2018-10-22 DIAGNOSIS — E119 Type 2 diabetes mellitus without complications: Secondary | ICD-10-CM | POA: Diagnosis not present

## 2018-10-22 DIAGNOSIS — K900 Celiac disease: Secondary | ICD-10-CM | POA: Diagnosis not present

## 2018-10-22 DIAGNOSIS — M545 Low back pain: Secondary | ICD-10-CM | POA: Diagnosis present

## 2018-10-22 DIAGNOSIS — R1031 Right lower quadrant pain: Secondary | ICD-10-CM | POA: Diagnosis not present

## 2018-10-22 DIAGNOSIS — K589 Irritable bowel syndrome without diarrhea: Secondary | ICD-10-CM | POA: Insufficient documentation

## 2018-10-22 DIAGNOSIS — Z79899 Other long term (current) drug therapy: Secondary | ICD-10-CM | POA: Insufficient documentation

## 2018-10-22 DIAGNOSIS — G8929 Other chronic pain: Secondary | ICD-10-CM | POA: Insufficient documentation

## 2018-10-22 DIAGNOSIS — K860 Alcohol-induced chronic pancreatitis: Secondary | ICD-10-CM | POA: Insufficient documentation

## 2018-10-22 NOTE — Progress Notes (Signed)
Right sacroiliac injection under fluoroscopic guidance  Indication: Right Low back and buttocks pain not relieved by medication management and other conservative care.  Informed consent was obtained after describing risks and benefits of the procedure with the patient, this includes bleeding, bruising, infection, paralysis and medication side effects. The patient wishes to proceed and has given written consent. The patient was placed in a prone position. The lumbar and sacral area was marked and prepped with Betadine. A 25-gauge 1-1/2 inch needle was inserted into the skin and subcutaneous tissue and 1 mL of 1% lidocaine was injected. Then a 25-gauge 3 inch spinal needle was inserted under fluoroscopic guidance into the Right sacroiliac joint. AP and lateral images were utilized. Omnipaque 180x0.5 mL under live fluoroscopy demonstrated no intravascular uptake. Then a solution containing one ML of 6 mgper ml betamethasone and 2 ML of 1% lidocaine MPF was injected x1.5 mL. Patient tolerated the procedure well. Post procedure instructions were given. Please see post procedure form.

## 2018-10-22 NOTE — Progress Notes (Signed)
  PROCEDURE RECORD  Physical Medicine and Rehabilitation   Name: Paige DakinKerry Fink DOB:10/24/1956 MRN: [id number]  Date:10/22/2018  Physician: Claudette LawsAndrew Kirsteins, MD    Nurse/CMA: Wessling, CMA Allergies:  Allergies  Allergen Reactions  . Morphine And Related Anxiety    Patient states, " it makes me  feel like I am crawling out of my skin"    Consent Signed: Yes.    Is patient diabetic? Yes.    CBG today? 124  Pregnant: No. LMP: No LMP recorded. (Menstrual status: Perimenopausal). (age [age])  Anticoagulants: no Anti-inflammatory: no Antibiotics: no  Procedure: Right Sacroiliac Injection Position: Prone Start Time: 2:06pm       End Time: 2:11pm  Fluoro Time: 11s   RN/CMA Nedra HaiLee, CMA Wessling, CMA    Time 1:53pm 2:12pm    BP 109/77 133/91    Pulse 109 86    Respirations 16 16    O2 Sat 98 97    S/S 6 6    Pain Level 5/10 4/10     D/C home with Son, patient A & O X 3, D/C instructions reviewed, and sits independently.

## 2018-10-22 NOTE — Patient Instructions (Addendum)
Sacroiliac injection was performed today. A combination of a naming medicine plus a cortisone medicine was injected. The injection was done under x-ray guidance. This procedure has been performed to help reduce low back and buttocks pain as well as potentially hip pain. The duration of this injection is variable lasting from hours to  Months. It may repeated if needed.  May cancel next injection if pain is doing better

## 2018-10-24 MED FILL — PANTOPRAZOLE SOD DR 40 MG T: 40 | 30 days supply | Qty: 30 | Fill #2

## 2018-10-24 MED FILL — hydrOXYzine HCL 25 MG TABS: 25 | 20 days supply | Qty: 60 | Fill #1

## 2018-10-24 MED FILL — LISINOPRIL 5 MG TAB: 5 | 30 days supply | Qty: 30 | Fill #1

## 2018-10-24 MED FILL — ATORVASTATIN CALCIUM 20 MG: 20 | 30 days supply | Qty: 30 | Fill #1

## 2018-10-24 MED FILL — metFORMIN HCL 500 MG TABS: 500 | 30 days supply | Qty: 120 | Fill #2

## 2018-10-24 MED FILL — BACLOFEN 10 MG TABLET: 10 | 30 days supply | Qty: 90 | Fill #2

## 2018-10-24 MED FILL — MECLIZINE 25 MG TABLET: 25 | 20 days supply | Qty: 60 | Fill #0

## 2018-10-24 MED FILL — FLUTICASONE PROP 50 MCG SPR: 50 | 20 days supply | Qty: 16 | Fill #0

## 2018-10-25 ENCOUNTER — Other Ambulatory Visit: Payer: Self-pay

## 2018-10-25 ENCOUNTER — Ambulatory Visit: Payer: Medicaid Other | Attending: Physical Medicine & Rehabilitation | Admitting: Physical Therapy

## 2018-10-25 ENCOUNTER — Telehealth: Payer: Self-pay | Admitting: Family Medicine

## 2018-10-25 DIAGNOSIS — M25651 Stiffness of right hip, not elsewhere classified: Secondary | ICD-10-CM | POA: Diagnosis present

## 2018-10-25 DIAGNOSIS — M25551 Pain in right hip: Secondary | ICD-10-CM | POA: Diagnosis present

## 2018-10-25 MED FILL — ACCU-CHEK AVIVA PLUS TEST S: 90 days supply | Qty: 100 | Fill #1

## 2018-10-25 MED FILL — PREDNISOLONE AC 1% EYE DROP: 1 | 30 days supply | Qty: 10 | Fill #1

## 2018-10-25 NOTE — Therapy (Signed)
[facility] [address] [place], [place], [address] Phone: [phone]   Fax:  [phone]  Physical Therapy Evaluation  Patient Details  Name: Paige Andrews MRN: [id number] Date of Birth: 1956/02/01 Referring Provider (PT): Claudette Laws   Encounter Date: 10/25/2018  PT End of Session - 10/25/18 0810    Visit Number  1    Number of Visits  4    Date for PT Re-Evaluation  11/22/18    PT Start Time  0810    PT Stop Time  0845    PT Time Calculation (min)  35 min    Activity Tolerance  Patient tolerated treatment well    Behavior During Therapy  [facility] for tasks assessed/performed       Past Medical History:  Diagnosis Date  . Anxiety   . Bipolar 1 disorder (HCC)   . Celiac disease   . Depression   . Hypertension   . IBS (irritable bowel syndrome)     Past Surgical History:  Procedure Laterality Date  . carpel tunnel Right   . CESAREAN SECTION    . SHOULDER SURGERY Right     There were no vitals filed for this visit.   Subjective Assessment - 10/25/18 0813    Subjective  Patient reports pain in R leg to foot beginning around 2000. A neurologist in [place] told her she had nerve damage. She also reports that her doctors discovered an old fracture at L1 about 3 years ago. She had injections into her piriformis which helped. She recently had another injection on 10/22/18 which helped some.  Patient also reports R shoulder pain after injectrion positioning.    Pertinent History  stable L2 compression fracture, anxiety, bipolar depression, DM, neuropathy of feet, shoulder surgery 1987/88 arthroscopic    How long can you sit comfortably?  5-30 min    How long can you walk comfortably?  45 min    Patient Stated Goals  to get rid of pain, improve sleep    Currently in Pain?  Yes    Pain Score  3     Pain Location  Hip    Pain Orientation  Right    Pain Descriptors / Indicators  Pressure;Stabbing    Pain  Type  Chronic pain    Pain Radiating Towards  around hip and down leg    Pain Onset  More than a month ago    Pain Frequency  Constant    Aggravating Factors   sitting, sleeping    Pain Relieving Factors  heat/ice    Multiple Pain Sites  Yes    Pain Score  8    Pain Location  Shoulder    Pain Orientation  Right    Pain Descriptors / Indicators  Nagging    Pain Type  Acute pain    Pain Onset  In the past 7 days    Pain Frequency  Constant    Aggravating Factors   extension, turning    Pain Relieving Factors  heat         [facility] PT Assessment - 10/25/18 0001      Assessment   Medical Diagnosis  R piriformis; S/I dysfunction    Referring Provider (PT)  Claudette Laws    Onset Date/Surgical Date  09/24/18    Next MD Visit  11/19/18    Prior Therapy  yes      Precautions   Precautions  None  Balance Screen   Has the patient fallen in the past 6 months  Yes    How many times?  1   LOB 3-4 weeks ago   Has the patient had a decrease in activity level because of a fear of falling?   No    Is the patient reluctant to leave their home because of a fear of falling?   No      Home Public house managernvironment   Living Environment  Private residence    Living Arrangements  Spouse/significant other    Home Layout  Two level    Additional Comments  step to with descending stairs      Prior Function   Level of Independence  Independent    Vocation  Retired      Hotel managerosture/Postural Control   Posture/Postural Control  No significant limitations    Posture Comments  forward head      ROM / Strength   AROM / PROM / Strength  AROM;Strength      AROM   Overall AROM Comments  lumbar WNL      Strength   Overall Strength Comments  grossly 5/5      Flexibility   Soft Tissue Assessment /Muscle Length  yes    Hamstrings  right    ITB  right    Piriformis  right    Levator Ani  left hip flexor tightness      Palpation   Spinal mobility  decreased PA mobility  R lumbar    Palpation comment   TPs in R gluteus min and med; L  glut min      Special Tests   Other special tests  Positve slump, SLR, faber Rt; neg hip scour; neg S/I special tests                Objective measurements completed on examination: See above findings.                PT Short Term Goals - 10/25/18 1032      PT SHORT TERM GOAL #1   Title  Ind with HEP for hip flexibility    Time  4    Period  Weeks    Status  New    Target Date  11/22/18      PT SHORT TERM GOAL #2   Title  Patient to report decreased pain with sitting by 50% or more in R hip/leg.    Time  4    Period  Weeks    Status  New      PT SHORT TERM GOAL #3   Title  Patient able to sleep without waking from hip pain    Time  4    Period  Weeks    Status  New      PT SHORT TERM GOAL #4   Title  Patient able to descend stairs with reciprocal gait     Time  4    Period  Weeks    Status  New        PT Long Term Goals - 10/25/18 1035      PT LONG TERM GOAL #1   Title  LTG = STG             Plan - 10/25/18 0846    Clinical Impression Statement  Patient presents with c/o R hip and leg pain since 2000 which has worsened in the past month.  She has marked tightness of R piriformis and  ITB as well as left hip flexor. Her LE strength is normal but lumbar stabilizers are weak. She has negative SI special tests, but positive SLR and slump tests on the right. She has a h/o of L2 compression fracture and decreased PA mobility in Rt lumbar, but denies OP. She has some pain in R groin with FABER and hip adduction. Her recent injection helped some but pain is still affecting siting, stairs and sleep. She also c/o R shoulder pain after injection.    History and Personal Factors relevant to plan of care:  stable L2 compression fracture, anxiety, bipolar depression, DM, neuropathy of feet, shoulder surgery 1987/88 arthroscopic    Clinical Presentation  Evolving    Clinical Presentation due to:  worsening sx    Clinical  Decision Making  Low    Rehab Potential  Good    PT Frequency  1x / week    PT Duration  4 weeks    PT Treatment/Interventions  ADLs/Self Care Home Management;Cryotherapy;Teacher, music;Therapeutic exercise;Neuromuscular re-education;Manual techniques;Dry needling    PT Next Visit Plan  R hip flexibility, L hip HF flexibilty, ITB/piriformis/gluteal release, R QL stretch, lumbar stab if time.    Consulted and Agree with Plan of Care  Patient       Patient will benefit from skilled therapeutic intervention in order to improve the following deficits and impairments:  Pain, Decreased strength, Decreased range of motion, Impaired flexibility, Postural dysfunction  Visit Diagnosis: Pain in right hip - Plan: PT plan of care cert/re-cert  Stiffness of right hip, not elsewhere classified - Plan: PT plan of care cert/re-cert     Problem List Patient Active Problem List   Diagnosis Date Noted  . Hypertension 08/26/2018  . Diabetes (HCC) 01/01/2018  . Asthma 09/06/2017  . Depression 06/08/2017  . Back pain 06/08/2017  . Shoulder pain 06/08/2017  . Insomnia 06/08/2017  . IBS (irritable bowel syndrome) 03/30/2017  . GERD (gastroesophageal reflux disease) 03/30/2017  . Constipation 01/23/2017  . Nausea & vomiting 01/21/2017  . Transaminitis 01/21/2017  . 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/21/2017  . Injury of right hand 09/14/2015  . Rash 09/14/2015    Sanah Kraska PT 10/25/2018, 10:39 AM  [facility] [address] [place], [place], [address] Phone: [phone]   Fax:  [phone]  Name: Paige Andrews MRN: [id number] Date of Birth: 04/01/1956

## 2018-10-25 NOTE — Telephone Encounter (Signed)
Patient called to get an update on the psychiatrist  Referral please follow up with patient.

## 2018-10-30 ENCOUNTER — Telehealth: Payer: Self-pay | Admitting: Licensed Clinical Social Worker

## 2018-10-30 NOTE — Telephone Encounter (Signed)
Call placed to pt. LCSWA attempted to provide follow up information regarding psych referral.   There was no voicemail set up for LCSWA to leave a message.

## 2018-11-04 ENCOUNTER — Encounter: Payer: Self-pay | Admitting: Physical Therapy

## 2018-11-04 ENCOUNTER — Ambulatory Visit: Payer: Medicaid Other | Admitting: Physical Therapy

## 2018-11-04 DIAGNOSIS — M25551 Pain in right hip: Secondary | ICD-10-CM | POA: Diagnosis not present

## 2018-11-04 DIAGNOSIS — M25651 Stiffness of right hip, not elsewhere classified: Secondary | ICD-10-CM

## 2018-11-04 NOTE — Therapy (Signed)
[facility] [address] [place], [place], [address] Phone: [phone]   Fax:  [phone]  Physical Therapy Treatment  Patient Details  Name: Paige Andrews MRN: [id number] Date of Birth: December 06, 1956 Referring Provider (PT): Alysia Penna   Encounter Date: 11/04/2018  PT End of Session - 11/04/18 1224    Visit Number  2    Date for PT Re-Evaluation  11/22/18    PT Start Time  1143    PT Stop Time  1225    PT Time Calculation (min)  42 min    Activity Tolerance  Patient tolerated treatment well    Behavior During Therapy  [facility] for tasks assessed/performed       Past Medical History:  Diagnosis Date  . Anxiety   . Bipolar 1 disorder ([place])   . Celiac disease   . Depression   . Hypertension   . IBS (irritable bowel syndrome)     Past Surgical History:  Procedure Laterality Date  . carpel tunnel Right   . CESAREAN SECTION    . SHOULDER SURGERY Right     There were no vitals filed for this visit.  Subjective Assessment - 11/04/18 1143    Subjective  Pt reports pain in R hip and shoulder    Currently in Pain?  Yes    Pain Score  4     Pain Location  --   Hip, shoulder   Pain Orientation  Right                       [facility] Adult PT Treatment/Exercise - 11/04/18 0001      Exercises   Exercises  Shoulder;Knee/Hip      Knee/Hip Exercises: Stretches   Passive Hamstring Stretch  Both;5 reps;10 seconds    Hip Flexor Stretch  Both;4 reps;10 seconds    Piriformis Stretch  Both;5 reps;10 seconds    Other Knee/Hip Stretches  Lower trunk otations 4x10''      Knee/Hip Exercises: Aerobic   Nustep  L3 x 6 min       Knee/Hip Exercises: Machines for Strengthening   Cybex Knee Extension  5lb x10     Cybex Knee Flexion  20lb x10       Knee/Hip Exercises: Standing   Lateral Step Up  Both;1 set;10 reps;Hand Hold: 0;Step Height: 4"      Knee/Hip Exercises: Seated   Sit to Sand  1 set;10  reps;without UE support   blue chair      Knee/Hip Exercises: Supine   Bridges  Both;1 set;10 reps    Bridges with [employer]  Both;1 set;10 reps    Bridges with Clamshell  Both;1 set;10 reps    Other Supine Knee/Hip Exercises  ball squeezes x10     Other Supine Knee/Hip Exercises  Hip abd blue band x10       Shoulder Exercises: Standing   Extension  Theraband;20 reps;Both    Theraband Level (Shoulder Extension)  Level 3 (Green)    Medical sales representative;Theraband;20 reps    Theraband Level (Shoulder Row)  Level 3 Nyoka Cowden)               PT Short Term Goals - 11/04/18 1225      PT SHORT TERM GOAL #1   Title  Ind with HEP for hip flexibility    Status  Partially Met        PT Long Term Goals - 10/25/18  [place] #1   Title  LTG = STG            Plan - 11/04/18 1225    Clinical Impression Statement  Pt tolerated an initial progression to TE fair. Some reports of posterior R hip pain reported with supine exercises. She also reports one bout of shooting pain down RLE with sit to stand.  Pulling sensation reported down her R leg in the respective muscle with seated curls and extensions. Denied modality post treatment despite some posterior hip discomfort after TX.     Rehab Potential  Good    PT Frequency  1x / week    PT Duration  4 weeks    PT Next Visit Plan  R hip flexibility, L hip HF flexibility, ITB/piriformis/gluteal release, R QL stretch, lumbar stab if time.       Patient will benefit from skilled therapeutic intervention in order to improve the following deficits and impairments:  Pain, Decreased strength, Decreased range of motion, Impaired flexibility, Postural dysfunction  Visit Diagnosis: Pain in right hip  Stiffness of right hip, not elsewhere classified     Problem List Patient Active Problem List   Diagnosis Date Noted  . Hypertension 08/26/2018  . Diabetes ([place]) 01/01/2018  . Asthma 09/06/2017  . Depression 06/08/2017   . Back pain 06/08/2017  . Shoulder pain 06/08/2017  . Insomnia 06/08/2017  . IBS (irritable bowel syndrome) 03/30/2017  . GERD (gastroesophageal reflux disease) 03/30/2017  . Constipation 01/23/2017  . Nausea & vomiting 01/21/2017  . Transaminitis 01/21/2017  . 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/21/2017  . Injury of right hand 09/14/2015  . Rash 09/14/2015    Scot Jun, PTA 11/04/2018, 12:32 PM  [place] [address] [place], [place], [address] Phone: [phone]   Fax:  [phone]  Name: Kerith Sherley MRN: [id number] Date of Birth: Feb 15, 1956

## 2018-11-12 ENCOUNTER — Ambulatory Visit: Payer: Medicaid Other | Admitting: Physical Therapy

## 2018-11-19 ENCOUNTER — Ambulatory Visit: Payer: Medicaid Other | Admitting: Physical Medicine & Rehabilitation

## 2018-11-22 ENCOUNTER — Ambulatory Visit: Payer: Medicaid Other | Admitting: Physical Therapy

## 2018-11-26 ENCOUNTER — Encounter: Payer: Self-pay | Admitting: Physical Therapy

## 2018-11-26 ENCOUNTER — Ambulatory Visit: Payer: Medicaid Other | Attending: Physical Medicine & Rehabilitation | Admitting: Physical Therapy

## 2018-11-26 DIAGNOSIS — M25651 Stiffness of right hip, not elsewhere classified: Secondary | ICD-10-CM | POA: Diagnosis present

## 2018-11-26 DIAGNOSIS — M25551 Pain in right hip: Secondary | ICD-10-CM | POA: Insufficient documentation

## 2018-11-26 NOTE — Therapy (Signed)
[address] [place], [place], [address] Phone: [phone]   Fax:  [phone]  Physical Therapy Treatment  Patient Details  Name: Paige Andrews MRN: [id number] Date of Birth: 24-Jul-1956 Referring Provider (PT): Alysia Penna   Encounter Date: 11/26/2018  PT End of Session - 11/26/18 0919    Visit Number  3    Date for PT Re-Evaluation  11/22/18    PT Start Time  0845    PT Stop Time  0931    PT Time Calculation (min)  46 min    Activity Tolerance  Patient limited by pain    Behavior During Therapy  [facility] for tasks assessed/performed       Past Medical History:  Diagnosis Date  . Anxiety   . Bipolar 1 disorder ([place])   . Celiac disease   . Depression   . Hypertension   . IBS (irritable bowel syndrome)     Past Surgical History:  Procedure Laterality Date  . carpel tunnel Right   . CESAREAN SECTION    . SHOULDER SURGERY Right     There were no vitals filed for this visit.  Subjective Assessment - 11/26/18 0847    Subjective  Pt reports that her R leg was hurting really bad last week 9/10, making it difficult to walk. She reports not being at therapy due to other appointment and having car trouble.     Currently in Pain?  Yes    Pain Score  4     Pain Location  --   Middle of back down both legs   Pain Orientation  Right         [facility] PT Assessment - 11/26/18 0001      Posture/Postural Control   Posture Comments  forward head      ROM / Strength   AROM / PROM / Strength  AROM;Strength      AROM   Overall AROM Comments  lumbar WNL      Strength   Overall Strength Comments  grossly 5/5                   [facility] Adult PT Treatment/Exercise - 11/26/18 0001      Knee/Hip Exercises: Stretches   Passive Hamstring Stretch  Both;5 reps;10 seconds    Piriformis Stretch  Both;5 reps;10 seconds    Other Knee/Hip Stretches  Lower trunk otations 4x10''      Knee/Hip  Exercises: Aerobic   Nustep  L3 x 6 min       Knee/Hip Exercises: Supine   Straight Leg Raises  Both;1 set;10 reps      Modalities   Modalities  Electrical Stimulation;Moist Research officer, political party Location  R SI area    Electrical Stimulation Action  IFC    Electrical Stimulation Parameters  supine to pt tolerance    Electrical Stimulation Goals  Pain      Manual Therapy   Manual Therapy  Passive ROM    Passive ROM  bilat hip and lumbar spine.                PT Short Term Goals - 11/26/18 0849      PT SHORT TERM GOAL #1   Title  Ind with HEP for hip flexibility    Status  Achieved      PT SHORT TERM GOAL #2   Title  Patient to  report decreased pain with sitting by 50% or more in R hip/leg.    Status  On-going      PT SHORT TERM GOAL #3   Title  Patient able to sleep without waking from hip pain    Status  On-going      PT SHORT TERM GOAL #4   Title  Patient able to descend stairs with reciprocal gait     Status  On-going        PT Long Term Goals - 10/25/18 1035      PT LONG TERM GOAL #1   Title  LTG = STG            Plan - 11/26/18 0919    Clinical Impression Statement  Pt reports a constant 5/10 main in her R posterior hip area. She did well with bridges without an increase in pain. Reports "I can feel it" in the anterior R hip with SLR. Pt R hip external rotation is very limited. She stated some days she feels fin and other he has increase pain. During stretching and MT symptoms were inconsistent, She reports "I can feel it" in different area with same movements    Rehab Potential  Good    PT Frequency  1x / week    PT Duration  4 weeks    PT Treatment/Interventions  ADLs/Self Care Home Management;Cryotherapy;Wellsite geologist;Therapeutic exercise;Neuromuscular re-education;Manual techniques;Dry needling    PT Next Visit Plan  R hip flexibility, L hip HF flexibilty,  ITB/piriformis/gluteal release, R QL stretch, lumbar stab if time.       Patient will benefit from skilled therapeutic intervention in order to improve the following deficits and impairments:  Pain, Decreased strength, Decreased range of motion, Impaired flexibility, Postural dysfunction  Visit Diagnosis: Pain in right hip  Stiffness of right hip, not elsewhere classified     Problem List Patient Active Problem List   Diagnosis Date Noted  . Hypertension 08/26/2018  . Diabetes ([place]) 01/01/2018  . Asthma 09/06/2017  . Depression 06/08/2017  . Back pain 06/08/2017  . Shoulder pain 06/08/2017  . Insomnia 06/08/2017  . IBS (irritable bowel syndrome) 03/30/2017  . GERD (gastroesophageal reflux disease) 03/30/2017  . Constipation 01/23/2017  . Nausea & vomiting 01/21/2017  . Transaminitis 01/21/2017  . 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/21/2017  . Injury of right hand 09/14/2015  . Rash 09/14/2015    Scot Jun, PTA 11/26/2018, 9:25 AM  [address] [place], [place], [address] Phone: [phone]   Fax:  [phone]  Name: Paige Andrews MRN: [id number] Date of Birth: August 04, 1956

## 2018-11-28 ENCOUNTER — Telehealth: Payer: Self-pay | Admitting: Licensed Clinical Social Worker

## 2018-11-28 NOTE — Telephone Encounter (Signed)
LCSWA received a voice message from pt requesting an update in psych referral.   LCSWA placed call [facility], Referral Coordinator, with [facility]. According to her, multiple attempts were made to contact pt on 09/30/18 & 10/01/18. Pt needs to contact agency to schedule initial appointment, new referral is not needed.   LCSWA placed call to patient to provide follow up information. LCSWA was informed by pt's friend, Marisue HumbleMaureen, that EMS was assessing pt due to ongoing panic attacks. Pt was found to be stable. LCSWA was provided permission from patient to provide supportive resources to friend, Marisue HumbleMaureen. Pt was strongly encouraged to contact [facility], in addition, to Mobile Crisis Unit, if needed.   Pt was appreciative of information.

## 2018-12-02 ENCOUNTER — Ambulatory Visit: Payer: Medicaid Other

## 2018-12-02 ENCOUNTER — Ambulatory Visit: Payer: Medicaid Other | Admitting: Physical Medicine & Rehabilitation

## 2018-12-03 ENCOUNTER — Ambulatory Visit: Payer: Medicaid Other | Admitting: Physical Therapy

## 2018-12-17 MED FILL — ATORVASTATIN 20 MG TABLET: 20 | 30 days supply | Qty: 30 | Fill #2

## 2018-12-17 MED FILL — metFORMIN HCL 500 MG TABS: 500 | 30 days supply | Qty: 120 | Fill #3

## 2018-12-17 MED FILL — hydrOXYzine HCL 25 MG TABS: 25 | 20 days supply | Qty: 60 | Fill #2

## 2018-12-17 MED FILL — PANTOPRAZOLE SOD DR 40 MG T: 40 | 30 days supply | Qty: 30 | Fill #3

## 2018-12-17 MED FILL — LISINOPRIL 5 MG TAB: 5 | 30 days supply | Qty: 30 | Fill #2

## 2018-12-18 MED FILL — BACLOFEN 10 MG TABLET: 10 | 30 days supply | Qty: 90 | Fill #0

## 2018-12-19 ENCOUNTER — Telehealth: Payer: Self-pay | Admitting: *Deleted

## 2018-12-19 ENCOUNTER — Encounter: Payer: Self-pay | Admitting: Physical Medicine & Rehabilitation

## 2018-12-19 ENCOUNTER — Ambulatory Visit: Payer: Medicaid Other | Admitting: Physical Medicine & Rehabilitation

## 2018-12-19 ENCOUNTER — Encounter: Payer: Medicaid Other | Attending: Physical Medicine & Rehabilitation

## 2018-12-19 VITALS — BP 122/87 | HR 96 | Resp 14 | Ht 64.0 in | Wt 167.0 lb

## 2018-12-19 DIAGNOSIS — G8929 Other chronic pain: Secondary | ICD-10-CM | POA: Diagnosis not present

## 2018-12-19 DIAGNOSIS — M25551 Pain in right hip: Secondary | ICD-10-CM | POA: Diagnosis not present

## 2018-12-19 DIAGNOSIS — K589 Irritable bowel syndrome without diarrhea: Secondary | ICD-10-CM | POA: Insufficient documentation

## 2018-12-19 DIAGNOSIS — M545 Low back pain: Secondary | ICD-10-CM | POA: Insufficient documentation

## 2018-12-19 DIAGNOSIS — M25561 Pain in right knee: Secondary | ICD-10-CM | POA: Diagnosis not present

## 2018-12-19 DIAGNOSIS — K900 Celiac disease: Secondary | ICD-10-CM | POA: Insufficient documentation

## 2018-12-19 DIAGNOSIS — E119 Type 2 diabetes mellitus without complications: Secondary | ICD-10-CM | POA: Diagnosis not present

## 2018-12-19 DIAGNOSIS — Z79899 Other long term (current) drug therapy: Secondary | ICD-10-CM | POA: Insufficient documentation

## 2018-12-19 DIAGNOSIS — M542 Cervicalgia: Secondary | ICD-10-CM | POA: Diagnosis not present

## 2018-12-19 DIAGNOSIS — M25552 Pain in left hip: Secondary | ICD-10-CM | POA: Diagnosis not present

## 2018-12-19 DIAGNOSIS — F319 Bipolar disorder, unspecified: Secondary | ICD-10-CM | POA: Insufficient documentation

## 2018-12-19 DIAGNOSIS — Z7984 Long term (current) use of oral hypoglycemic drugs: Secondary | ICD-10-CM | POA: Insufficient documentation

## 2018-12-19 DIAGNOSIS — I10 Essential (primary) hypertension: Secondary | ICD-10-CM | POA: Insufficient documentation

## 2018-12-19 DIAGNOSIS — G5701 Lesion of sciatic nerve, right lower limb: Secondary | ICD-10-CM | POA: Insufficient documentation

## 2018-12-19 DIAGNOSIS — K860 Alcohol-induced chronic pancreatitis: Secondary | ICD-10-CM | POA: Diagnosis not present

## 2018-12-19 DIAGNOSIS — M533 Sacrococcygeal disorders, not elsewhere classified: Secondary | ICD-10-CM | POA: Diagnosis not present

## 2018-12-19 DIAGNOSIS — F419 Anxiety disorder, unspecified: Secondary | ICD-10-CM | POA: Diagnosis not present

## 2018-12-19 DIAGNOSIS — R1031 Right lower quadrant pain: Secondary | ICD-10-CM | POA: Insufficient documentation

## 2018-12-19 NOTE — Progress Notes (Signed)
Right sacroiliac injection under fluoroscopic guidance  Indication: Right Low back and buttocks pain not relieved by medication management and other conservative care.  Informed consent was obtained after describing risks and benefits of the procedure with the patient, this includes bleeding, bruising, infection, paralysis and medication side effects. The patient wishes to proceed and has given written consent. The patient was placed in a prone position. The lumbar and sacral area was marked and prepped with Betadine. A 25-gauge 1-1/2 inch needle was inserted into the skin and subcutaneous tissue and 1 mL of 1% lidocaine was injected. Then a 25-gauge 3 inch spinal needle was inserted under fluoroscopic guidance into the Right sacroiliac joint. AP and lateral images were utilized. Omnipaque 180x0.5 mL under live fluoroscopy demonstrated no intravascular uptake. Then a solution containing one ML of 6 mgper ml betamethasone and 2 ML of 1% lidocaine MPF was injected x1.5 mL. Patient tolerated the procedure well. Post procedure instructions were given. Please see post procedure form.

## 2018-12-19 NOTE — Telephone Encounter (Signed)
Error

## 2018-12-19 NOTE — Progress Notes (Signed)
  PROCEDURE RECORD [facility]   Name: Paige Andrews DOB:[birth year] MRN: [id number]  Date:12/19/2018  Physician: Claudette Laws, MD    Nurse/CMA: Glenisha Gundry., CMA   Allergies:  Allergies  Allergen Reactions  . Morphine And Related Anxiety    Patient states, " it makes me  feel like I am crawling out of my skin"    Consent Signed: Yes.    Is patient diabetic? Yes.    CBG today? 101  Pregnant: No. LMP: No LMP recorded. (age 17-55)  Anticoagulants: no Anti-inflammatory: yes (IBUPROFEN) Antibiotics: no  Procedure: RIGHT SACROILIAC STEROID INJECTION  Position: Prone Start Time: 1:15pm  End Time: 1:22pm  Fluoro Time: 9  RN/CMA Nazire Fruth, CMA [place], CMA    Time 12:25pm 1:23pm    BP 122/87 136/85    Pulse 96 99    Respirations 16 16    O2 Sat 99 98    S/S 6 6    Pain Level 5/10 5/10     D/C home with husband, patient A & O X 3, D/C instructions reviewed, and sits independently.

## 2018-12-31 MED FILL — busPIRone HCL 10 MG TABS: 10 | 30 days supply | Qty: 90 | Fill #0

## 2019-01-06 MED FILL — DULoxetine HCL 60 MG CPEP: 60 | 30 days supply | Qty: 60 | Fill #0

## 2019-01-06 MED FILL — FLUTICASONE PROP 50 MCG SPR: 50 | 20 days supply | Qty: 16 | Fill #1

## 2019-01-06 MED FILL — ACCU-CHEK SOFTCLIX LANCETS: 30 days supply | Qty: 100 | Fill #1

## 2019-01-16 ENCOUNTER — Ambulatory Visit: Payer: Self-pay | Admitting: Physical Medicine & Rehabilitation

## 2019-01-20 ENCOUNTER — Ambulatory Visit: Payer: Self-pay | Admitting: Family Medicine

## 2019-01-30 ENCOUNTER — Encounter: Payer: Self-pay | Admitting: Physical Medicine & Rehabilitation

## 2019-01-30 ENCOUNTER — Ambulatory Visit: Payer: Medicaid Other | Admitting: Physical Medicine & Rehabilitation

## 2019-01-30 ENCOUNTER — Encounter: Payer: Medicaid Other | Attending: Physical Medicine & Rehabilitation

## 2019-01-30 VITALS — BP 133/91 | HR 98 | Ht 64.0 in | Wt 167.0 lb

## 2019-01-30 DIAGNOSIS — I10 Essential (primary) hypertension: Secondary | ICD-10-CM | POA: Diagnosis not present

## 2019-01-30 DIAGNOSIS — G5701 Lesion of sciatic nerve, right lower limb: Secondary | ICD-10-CM | POA: Diagnosis not present

## 2019-01-30 DIAGNOSIS — M545 Low back pain: Secondary | ICD-10-CM | POA: Diagnosis present

## 2019-01-30 DIAGNOSIS — M25551 Pain in right hip: Secondary | ICD-10-CM | POA: Insufficient documentation

## 2019-01-30 DIAGNOSIS — F319 Bipolar disorder, unspecified: Secondary | ICD-10-CM | POA: Diagnosis not present

## 2019-01-30 DIAGNOSIS — F419 Anxiety disorder, unspecified: Secondary | ICD-10-CM | POA: Insufficient documentation

## 2019-01-30 DIAGNOSIS — K589 Irritable bowel syndrome without diarrhea: Secondary | ICD-10-CM | POA: Insufficient documentation

## 2019-01-30 DIAGNOSIS — M542 Cervicalgia: Secondary | ICD-10-CM | POA: Insufficient documentation

## 2019-01-30 DIAGNOSIS — M25561 Pain in right knee: Secondary | ICD-10-CM | POA: Insufficient documentation

## 2019-01-30 DIAGNOSIS — K900 Celiac disease: Secondary | ICD-10-CM | POA: Insufficient documentation

## 2019-01-30 DIAGNOSIS — M25552 Pain in left hip: Secondary | ICD-10-CM | POA: Diagnosis not present

## 2019-01-30 DIAGNOSIS — Z79899 Other long term (current) drug therapy: Secondary | ICD-10-CM | POA: Insufficient documentation

## 2019-01-30 DIAGNOSIS — Z7984 Long term (current) use of oral hypoglycemic drugs: Secondary | ICD-10-CM | POA: Insufficient documentation

## 2019-01-30 DIAGNOSIS — K860 Alcohol-induced chronic pancreatitis: Secondary | ICD-10-CM | POA: Insufficient documentation

## 2019-01-30 DIAGNOSIS — R1031 Right lower quadrant pain: Secondary | ICD-10-CM | POA: Insufficient documentation

## 2019-01-30 DIAGNOSIS — G8929 Other chronic pain: Secondary | ICD-10-CM

## 2019-01-30 DIAGNOSIS — E119 Type 2 diabetes mellitus without complications: Secondary | ICD-10-CM | POA: Insufficient documentation

## 2019-01-30 DIAGNOSIS — M533 Sacrococcygeal disorders, not elsewhere classified: Secondary | ICD-10-CM | POA: Diagnosis not present

## 2019-01-30 NOTE — Progress Notes (Signed)
  PROCEDURE RECORD [facility]   Name: Paige Andrews DOB:[birth year] MRN: [id number]  Date:01/30/2019  Physician: Claudette Laws, MD    Nurse/CMA: Nedra Hai, CMA  Allergies:  Allergies  Allergen Reactions  . Morphine And Related Anxiety    Patient states, " it makes me  feel like I am crawling out of my skin"    Consent Signed: Yes.    Is patient diabetic? Yes.    CBG today? 108  Pregnant: No. LMP: No LMP recorded. (age 63-55)  Anticoagulants: no Anti-inflammatory: yes (motrin) Antibiotics: no  Procedure: right sacroiliac steroid injection  Position: Prone Start Time: 11:53am End Time: 11:59am  Fluoro Time:26  RN/CMA Leonette Tischer, CMA Lee, CMA    Time 11:10am 12:06pm    BP 133/91 158/103    Pulse 98 85    Respirations 14 14    O2 Sat 97 95    S/S 6 6    Pain Level 5/10 4/10     D/C home with boyfriend, patient A & O X 3, D/C instructions reviewed, and sits independently.

## 2019-01-30 NOTE — Progress Notes (Signed)
L5 dorsal ramus S1-S2-S3 lateral branch blocks under fluoroscopic guidance Right side  Informed consent was obtained after describing risks and benefits of the procedure with patient these include bleeding bruising and infection.  He elects to proceed and has given written consent.  Patient placed prone on fluoroscopy table Betadine prep sterile drape a 25-gauge 1.5 inch needle was used to anesthetize skin and subcu tissue with 1% lidocaine 1 cc into each of 4 sites.  Then a 22-gauge 3.5" needle was inserted under fluoroscopic guidance for starting the S1 SAP sacral ala junction.  Bone contact made.  Isovue 200 x 0.5 mL demonstrated no intravascular uptake then 0.5 mL of 2% lidocaine was injected.  Then the lateral aspect of the S1, S2, S3 foramen was targeted.  Bone contact made out, Isovue-200 times 0.5 mL demonstrated no nerve root or intravascular uptake within 0.5 mL of 2% lidocaine solution was injected after negative drawback for blood.  Patient tolerated procedure well.  Postinjection instructions given.

## 2019-02-05 MED FILL — busPIRone HCL 10 MG TABS: 10 | 30 days supply | Qty: 90 | Fill #1

## 2019-02-05 MED FILL — DULoxetine HCL 60 MG CPEP: 60 | 30 days supply | Qty: 60 | Fill #1

## 2019-02-05 MED FILL — BACLOFEN 10 MG TABLET: 10 | 30 days supply | Qty: 90 | Fill #1

## 2019-02-05 MED FILL — OLANZapine 5 MG TABS: 5 | 30 days supply | Qty: 30 | Fill #0

## 2019-02-17 ENCOUNTER — Ambulatory Visit: Payer: Self-pay | Admitting: Family Medicine

## 2019-02-26 ENCOUNTER — Other Ambulatory Visit: Payer: Self-pay

## 2019-02-27 MED ORDER — LISINOPRIL 5 MG PO TABS: 5.0000 mg | ORAL_TABLET | Freq: Every day | ORAL | 0 refills | Status: DC

## 2019-02-27 MED ORDER — ATORVASTATIN CALCIUM 40 MG PO TABS: 40.0000 mg | ORAL_TABLET | Freq: Every day | ORAL | 0 refills | Status: DC

## 2019-02-28 ENCOUNTER — Ambulatory Visit: Payer: Self-pay | Admitting: Physical Medicine & Rehabilitation

## 2019-03-06 MED FILL — BACLOFEN 10 MG TABLET: 10 | 30 days supply | Qty: 90 | Fill #2

## 2019-03-06 MED FILL — hydrOXYzine HCL 25 MG TABS: 25 | 20 days supply | Qty: 60 | Fill #3

## 2019-03-06 MED FILL — DULoxetine HCL 60 MG CPEP: 60 | 30 days supply | Qty: 60 | Fill #0

## 2019-03-06 MED FILL — metFORMIN HCL 500 MG TABS: 500 | 90 days supply | Qty: 360 | Fill #0

## 2019-03-11 MED FILL — busPIRone HCL 10 MG TABS: 10 | 60 days supply | Qty: 180 | Fill #0

## 2019-03-13 MED FILL — OLANZapine 5 MG TABS: 5 | 30 days supply | Qty: 30 | Fill #0

## 2019-03-28 ENCOUNTER — Other Ambulatory Visit: Payer: Self-pay

## 2019-03-28 ENCOUNTER — Encounter: Payer: Self-pay | Admitting: Physical Medicine & Rehabilitation

## 2019-03-28 ENCOUNTER — Encounter: Payer: Medicaid Other | Attending: Physical Medicine & Rehabilitation

## 2019-03-28 ENCOUNTER — Ambulatory Visit (HOSPITAL_BASED_OUTPATIENT_CLINIC_OR_DEPARTMENT_OTHER): Payer: Medicaid Other | Admitting: Physical Medicine & Rehabilitation

## 2019-03-28 ENCOUNTER — Telehealth: Payer: Self-pay | Admitting: *Deleted

## 2019-03-28 VITALS — Ht 64.0 in | Wt 175.0 lb

## 2019-03-28 DIAGNOSIS — R1031 Right lower quadrant pain: Secondary | ICD-10-CM | POA: Insufficient documentation

## 2019-03-28 DIAGNOSIS — M25561 Pain in right knee: Secondary | ICD-10-CM | POA: Insufficient documentation

## 2019-03-28 DIAGNOSIS — K900 Celiac disease: Secondary | ICD-10-CM | POA: Insufficient documentation

## 2019-03-28 DIAGNOSIS — M533 Sacrococcygeal disorders, not elsewhere classified: Secondary | ICD-10-CM

## 2019-03-28 DIAGNOSIS — M25551 Pain in right hip: Secondary | ICD-10-CM | POA: Insufficient documentation

## 2019-03-28 DIAGNOSIS — E119 Type 2 diabetes mellitus without complications: Secondary | ICD-10-CM | POA: Insufficient documentation

## 2019-03-28 DIAGNOSIS — G8929 Other chronic pain: Secondary | ICD-10-CM | POA: Diagnosis not present

## 2019-03-28 DIAGNOSIS — K589 Irritable bowel syndrome without diarrhea: Secondary | ICD-10-CM | POA: Insufficient documentation

## 2019-03-28 DIAGNOSIS — I10 Essential (primary) hypertension: Secondary | ICD-10-CM | POA: Insufficient documentation

## 2019-03-28 DIAGNOSIS — M545 Low back pain: Secondary | ICD-10-CM | POA: Insufficient documentation

## 2019-03-28 DIAGNOSIS — M25552 Pain in left hip: Secondary | ICD-10-CM | POA: Insufficient documentation

## 2019-03-28 DIAGNOSIS — K860 Alcohol-induced chronic pancreatitis: Secondary | ICD-10-CM | POA: Insufficient documentation

## 2019-03-28 DIAGNOSIS — F419 Anxiety disorder, unspecified: Secondary | ICD-10-CM | POA: Insufficient documentation

## 2019-03-28 DIAGNOSIS — G5701 Lesion of sciatic nerve, right lower limb: Secondary | ICD-10-CM | POA: Insufficient documentation

## 2019-03-28 DIAGNOSIS — M542 Cervicalgia: Secondary | ICD-10-CM | POA: Insufficient documentation

## 2019-03-28 DIAGNOSIS — Z7984 Long term (current) use of oral hypoglycemic drugs: Secondary | ICD-10-CM | POA: Insufficient documentation

## 2019-03-28 DIAGNOSIS — F319 Bipolar disorder, unspecified: Secondary | ICD-10-CM | POA: Insufficient documentation

## 2019-03-28 DIAGNOSIS — Z79899 Other long term (current) drug therapy: Secondary | ICD-10-CM | POA: Insufficient documentation

## 2019-03-28 MED ORDER — DIAZEPAM 10 MG PO TABS: 10.0000 mg | ORAL_TABLET | Freq: Once | ORAL | 0 refills | Status: DC

## 2019-03-28 MED ORDER — DIAZEPAM 10 MG PO TABS: ORAL_TABLET | ORAL | 0 refills | Status: DC

## 2019-03-28 NOTE — Progress Notes (Signed)
Subjective:    Patient ID: Paige Andrews, female    DOB: [birth year], 63 y.o.   MRN: [id number]  HPI 63 year old female with primary complaint of right-sided low back and hip pain. The patient has had MRI scans in 2015 which were reviewed at previous visits and did not show any significant lumbar spinal stenosis.  The patient denies any numbness or tingling down the leg.  She does have some knee pain as well as ankle pain.  All these pains subsided after both a right sacroiliac injection, intra-articular as well as right L5 dorsal ramus right S1-S2-S3 lateral branch blocks last performed January 30, 2019  The patient has not had any falls and or any new injuries.  She did well for a number of weeks after the injection in February 2020.  Past medical history significant for alcoholic pancreatitis  Pain Inventory Average Pain 6 Pain Right Now 6 My pain is dull, aching and throbbing  In the last 24 hours, has pain interfered with the following? General activity 6 Relation with others 6 Enjoyment of life 6 What TIME of day is your pain at its worst? varies Sleep (in general) Poor  Pain is worse with: sitting and standing Pain improves with: injections Relief from Meds: na  Mobility how many minutes can you walk? 30 ability to climb steps?  yes do you drive?  yes  Function retired I need assistance with the following:  household duties and shopping  Neuro/Psych numbness dizziness  Prior Studies Any changes since last visit?  no  Physicians involved in your care Any changes since last visit?  no   Family History  Problem Relation Age of Onset  . Dementia Mother   . Breast cancer Mother   . Pancreatitis Father   . Dementia Father    Social History   Socioeconomic History  . Marital status: Single    Spouse name: Not on file  . Number of children: Not on file  . Years of education: Not on file  . Highest education level: Not on file  Occupational History  .  Not on file  Social Needs  . Financial resource strain: Not on file  . Food insecurity:    Worry: Not on file    Inability: Not on file  . Transportation needs:    Medical: Not on file    Non-medical: Not on file  Tobacco Use  . Smoking status: Never Smoker  . Smokeless tobacco: Never Used  Substance and Sexual Activity  . 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 Drug use: No  . Sexual activity: Never  Lifestyle  . Physical activity:    Days per week: Not on file    Minutes per session: Not on file  . Stress: Not on file  Relationships  . Social connections:    Talks on phone: Not on file    Gets together: Not on file    Attends religious service: Not on file    Active member of club or organization: Not on file    Attends meetings of clubs or organizations: Not on file    Relationship status: Not on file  Other Topics Concern  . Not on file  Social History Narrative  . Not on file   Past Surgical History:  Procedure Laterality Date  . carpel tunnel Right   . CESAREAN SECTION    . SHOULDER SURGERY Right  Past Medical History:  Diagnosis Date  . Anxiety   . Bipolar 1 disorder ([place])   . Celiac disease   . Depression   . Hypertension   . IBS (irritable bowel syndrome)    Ht 5' 4"  (1.626 m)   Wt 175 lb (79.4 kg)   BMI 30.04 kg/m   Opioid Risk Score:   Fall Risk Score:  `1  Depression screen PHQ 2/9  Depression screen [facility] 2/9 03/28/2019 10/16/2018 06/26/2018 04/03/2018 01/01/2018 09/06/2017 06/08/2017  Decreased Interest 1 3 3 3 3 3 3   Down, Depressed, Hopeless 1 3 3 3 3 3 3   PHQ - 2 Score 2 6 6 6 6 6 6   Altered sleeping - 3 3 3 3 3 3   Tired, decreased energy - 3 3 3 3 3 3   Change in appetite - 3 3 3 3 3 3   Feeling bad or failure about yourself  - 3 3 3 3 3  -  Trouble concentrating - 3 3 3 3 3 3   Moving slowly or fidgety/restless - 3 3 3 3 3 3   Suicidal thoughts - 1 3 3 3 3 1   PHQ-9 Score - 25 27 27 27  27 22     Review of Systems  Constitutional: Positive for appetite change and diaphoresis.  HENT: Negative.   Eyes: Negative.   Respiratory: Positive for wheezing.   Cardiovascular: Negative.   Gastrointestinal: Positive for constipation and diarrhea.  Endocrine: Negative.   Genitourinary: Negative.   Musculoskeletal: Positive for back pain, joint swelling and myalgias.  Skin: Negative.   Neurological: Positive for dizziness and numbness.  Hematological: Negative.   Psychiatric/Behavioral: Negative.   All other systems reviewed and are negative.      Objective:   Physical Exam  Deferred, virtual phone visit Speech without dysarthria Mood and affect are appropriate Oriented and alert      Assessment & Plan:  1.  Chronic right sacroiliac pain relieved by both intra-articular injections of the right sacroiliac joint as well as nerve blocks. We discussed that these 2 procedures can be helpful on a short-term basis but are unlikely to give a longer term response.   We discussed radiofrequency neurotomy to the right L5 dorsal ramus right S1-S2-S3 lateral branches.  We discussed the mechanism of response as well as duration of response which is generally 6 to 12 months. She would like to proceed with this.  She can have a driver therefore we can call in some Valium 10 mg to be taken as soon as she arrives at the clinic. We will schedule in the next 1 to 2 weeks

## 2019-03-28 NOTE — Telephone Encounter (Signed)
[facility] does not carry diazepam.  It will need to be sent to a retail pharmacy. I have sent it to [facility] on [address] and Ms Okerstrom is aware,

## 2019-04-03 ENCOUNTER — Other Ambulatory Visit: Payer: Self-pay

## 2019-04-03 ENCOUNTER — Encounter: Payer: Self-pay | Admitting: Family Medicine

## 2019-04-03 ENCOUNTER — Ambulatory Visit: Payer: Medicaid Other | Attending: Family Medicine | Admitting: Family Medicine

## 2019-04-03 DIAGNOSIS — F319 Bipolar disorder, unspecified: Secondary | ICD-10-CM | POA: Diagnosis not present

## 2019-04-03 DIAGNOSIS — F419 Anxiety disorder, unspecified: Secondary | ICD-10-CM

## 2019-04-03 DIAGNOSIS — M533 Sacrococcygeal disorders, not elsewhere classified: Secondary | ICD-10-CM

## 2019-04-03 DIAGNOSIS — G4709 Other insomnia: Secondary | ICD-10-CM

## 2019-04-03 DIAGNOSIS — I10 Essential (primary) hypertension: Secondary | ICD-10-CM

## 2019-04-03 DIAGNOSIS — E1165 Type 2 diabetes mellitus with hyperglycemia: Secondary | ICD-10-CM | POA: Diagnosis not present

## 2019-04-03 MED ORDER — LOSARTAN POTASSIUM 25 MG PO TABS: 25.0000 mg | ORAL_TABLET | Freq: Every day | ORAL | 1 refills | Status: DC

## 2019-04-03 MED ORDER — METFORMIN HCL 500 MG PO TABS: 1000.0000 mg | ORAL_TABLET | Freq: Two times a day (BID) | ORAL | 1 refills | Status: DC

## 2019-04-03 MED ORDER — ZOLPIDEM TARTRATE 5 MG PO TABS: 5.0000 mg | ORAL_TABLET | Freq: Every evening | ORAL | 2 refills | Status: DC | PRN

## 2019-04-03 MED ORDER — ATORVASTATIN CALCIUM 40 MG PO TABS: 40.0000 mg | ORAL_TABLET | Freq: Every day | ORAL | 1 refills | Status: DC

## 2019-04-03 MED FILL — ATORVASTATIN CALCIUM 40 MG: 40 | 90 days supply | Qty: 90 | Fill #0

## 2019-04-03 MED FILL — LOSARTAN POTASSIUM 25 MG TA: 25 | 90 days supply | Qty: 90 | Fill #0

## 2019-04-03 NOTE — Progress Notes (Signed)
Patient has been called and DOB has been verified. Patient has been screened and transferred to PCP to start phone visit.  C/C: diabetes,ankle pain.

## 2019-04-03 NOTE — Progress Notes (Signed)
Virtual Visit via Telephone Note  I connected with Paige Andrews, on 04/03/2019 at 10:17 AM by telephone and verified that I am speaking with the correct person using two identifiers.   Consent: I discussed the limitations, risks, security and privacy concerns of performing an evaluation and management service by telephone and the availability of in person appointments. I also discussed with the patient that there may be a patient responsible charge related to this service. The patient expressed understanding and agreed to proceed.   Location of Patient: Home  Location of Provider: Clinic   Persons participating in Telemedicine visit: Caeleigh Prohaska Farrington-CMA Dr. Felecia Shelling     History of Present Illness: Paige Andrews  is a 63 year old female with a history of type 2 diabetes mellitus (A1c 8.3) IBS, previous alcohol abuse, pancreatitis, asthma, GERD, Bipolar disorder, anxiety and depression who presents for a follow-up visit.  She complains of lisinopril causing a cough and so she discontinued it.  Currently does not take anything for her hypertension. Has been compliant with metformin and her random sugars have been around 138.  She endorses not being compliant with diabetic diet and has been eating uncontrollably and has gained a lot of weight which she attributes to her olanzapine.  She tries to walk occasionally.  Bipolar disorder is managed by psychiatry-Tracy Lazarus Salines.  She does experience panic attacks which she said is uncontrolled on hydroxyzine but her bipolar is controlled.  For her sacroiliac joint dysfunction she has had some intra-articular injections and nerve blocks which have been effective.  She has an upcoming appointment with rehab tomorrow for another injection.   Past Medical History:  Diagnosis Date  . Anxiety   . Bipolar 1 disorder ([place])   . Celiac disease   . Depression   . Hypertension   . IBS (irritable bowel syndrome)    Allergies   Allergen Reactions  . Morphine And Related Anxiety    Patient states, " it makes me  feel like I am crawling out of my skin"    Current Outpatient Medications on File Prior to Visit  Medication Sig Dispense Refill  . ACCU-CHEK SOFTCLIX LANCETS lancets Use as instructed to test sugar once daily 100 each 12  . aluminum chloride (DRYSOL) 20 % external solution Apply topically at bedtime. 35 mL 1  . atorvastatin (LIPITOR) 40 MG tablet Take 1 tablet (40 mg total) by mouth daily. Must keep appt in April for more refills. 30 tablet 0  . baclofen (LIORESAL) 10 MG tablet Take 1 tablet (10 mg total) by mouth 3 (three) times daily. 90 each 3  . Blood Glucose Monitoring Suppl (ACCU-CHEK AVIVA PLUS) w/Device KIT Use as instructed to test sugar once daily 1 kit 0  . busPIRone (BUSPAR) 10 MG tablet Take 10 mg by mouth 3 (three) times daily.    . cetirizine (ZYRTEC) 10 MG tablet Take 1 tablet (10 mg total) by mouth daily. 30 tablet 1  . diazepam (VALIUM) 10 MG tablet Take 10 mg  tablet as soon as you check in to clinic prior to injection 1 tablet 0  . DULoxetine (CYMBALTA) 60 MG capsule Take 1 capsule (60 mg total) by mouth daily. 30 capsule 3  . glucose blood (ACCU-CHEK AVIVA PLUS) test strip Use as instructed to test sugar once daily 100 each 12  . hydrOXYzine (ATARAX/VISTARIL) 25 MG tablet Take 1 tablet (25 mg total) by mouth 3 (three) times daily as needed. 60 tablet 3  . ketorolac (  ACULAR) 0.4 % SOLN 1 drop 4 (four) times daily.    . Lancets Misc. (ACCU-CHEK FASTCLIX LANCET) KIT Use as directed once daily 1 kit 0  . meclizine (ANTIVERT) 25 MG tablet Take 1 tablet (25 mg total) by mouth 3 (three) times daily as needed for dizziness. 60 tablet 1  . metFORMIN (GLUCOPHAGE) 500 MG tablet Take 2 tablets (1,000 mg total) by mouth 2 (two) times daily with a meal. 120 tablet 3  . ofloxacin (OCUFLOX) 0.3 % ophthalmic solution 1 drop 4 (four) times daily.    Marland Kitchen OLANZapine (ZYPREXA) 5 MG tablet Take 5 mg by mouth  at bedtime.    . pantoprazole (PROTONIX) 40 MG tablet Take 1 tablet (40 mg total) by mouth daily. 60 tablet 3  . albuterol (PROVENTIL HFA;VENTOLIN HFA) 108 (90 Base) MCG/ACT inhaler Inhale 2 puffs into the lungs every 6 (six) hours as needed for wheezing or shortness of breath. (Patient not taking: Reported on 04/03/2019) 1 Inhaler 1  . fluticasone (FLONASE) 50 MCG/ACT nasal spray Place 2 sprays into both nostrils daily. (Patient not taking: Reported on 04/03/2019) 16 g 1  . gabapentin (NEURONTIN) 300 MG capsule Take 1 capsule (300 mg total) by mouth 2 (two) times daily. (Patient not taking: Reported on 04/03/2019) 60 capsule 3  . lisinopril (PRINIVIL,ZESTRIL) 5 MG tablet Take 1 tablet (5 mg total) by mouth daily. Must keep appt in April for more refills. (Patient not taking: Reported on 04/03/2019) 30 tablet 0  . prednisoLONE acetate (PRED FORTE) 1 % ophthalmic suspension 1 drop 4 (four) times daily.     No current facility-administered medications on file prior to visit.     Observations/Objective: Alert, awake, oriented x3 Not in acute distress   CMP Latest Ref Rng & Units 04/01/2018 04/01/2018 09/06/2017  Glucose 65 - 99 mg/dL 466(H) 483(H) 242(H)  BUN 6 - 20 mg/dL 25(H) 26(H) 13  Creatinine 0.44 - 1.00 mg/dL 1.12(H) 1.20(H) 0.77  Sodium 135 - 145 mmol/L 134(L) 134(L) 137  Potassium 3.5 - 5.1 mmol/L 4.2 4.0 4.3  Chloride 101 - 111 mmol/L 106 101 103  CO2 22 - 32 mmol/L 18(L) 17(L) 20  Calcium 8.9 - 10.3 mg/dL 9.2 10.6(H) 10.2  Total Protein 6.5 - 8.1 g/dL - 8.0 7.2  Total Bilirubin 0.3 - 1.2 mg/dL - 0.8 0.5  Alkaline Phos 38 - 126 U/L - 100 97  AST 15 - 41 U/L - 47(H) 27  ALT 14 - 54 U/L - 71(H) 38(H)    Lipid Panel     Component Value Date/Time   CHOL 218 (H) 09/02/2018 1029   TRIG 137 09/02/2018 1029   HDL 48 09/02/2018 1029   CHOLHDL 4.5 (H) 09/02/2018 1029   LDLCALC 143 (H) 09/02/2018 1029    Lab Results  Component Value Date   HGBA1C 8.3 (A) 08/26/2018   HGBA1C 8.3  08/26/2018   HGBA1C 8.3 (A) 08/26/2018   HGBA1C 8.3 (A) 08/26/2018     Assessment and Plan: 1. Type 2 diabetes mellitus with hyperglycemia, without long-term current use of insulin (HCC) Uncontrolled with A1c of 8.3 from last year We will repeat A1c and adjust regimen accordingly Diabetic diet, lifestyle modifications - CMP14+EGFR; Future - Lipid panel; Future - Microalbumin/Creatinine Ratio, Urine; Future - Hemoglobin A1c; Future - metFORMIN (GLUCOPHAGE) 500 MG tablet; Take 2 tablets (1,000 mg total) by mouth 2 (two) times daily with a meal.  Dispense: 360 tablet; Refill: 1 - atorvastatin (LIPITOR) 40 MG tablet; Take 1 tablet (40 mg  total) by mouth daily.  Dispense: 90 tablet; Refill: 1  2. Bipolar 1 disorder ([place]) Stable Managed by psychiatry  3. Anxiety Uncontrolled with panic attacks occurring Not doing well on hydroxyzine Advised to discuss this with her psychiatrist  4. Essential hypertension Discontinue lisinopril due to cough Commence losartan Counseled on blood pressure goal of less than 130/80, low-sodium, DASH diet, medication compliance, 150 minutes of moderate intensity exercise per week. Discussed medication compliance, adverse effects. - losartan (COZAAR) 25 MG tablet; Take 1 tablet (25 mg total) by mouth daily.  Dispense: 90 tablet; Refill: 1  5. Sacroiliac joint dysfunction Improved with SI joint injections and nerve block Has upcoming appointment tomorrow with rehab  6. Other insomnia Controlled - zolpidem (AMBIEN) 5 MG tablet; Take 1 tablet (5 mg total) by mouth at bedtime as needed for sleep.  Dispense: 30 tablet; Refill: 2   Follow Up Instructions: Return in about 3 months (around 07/03/2019).    I discussed the assessment and treatment plan with the patient. The patient was provided an opportunity to ask questions and all were answered. The patient agreed with the plan and demonstrated an understanding of the instructions.   The patient was  advised to call back or seek an in-person evaluation if the symptoms worsen or if the condition fails to improve as anticipated.     I provided 27 minutes total of non-face-to-face time during this encounter including median intraservice time, reviewing previous notes, labs, imaging, medications and explaining diagnosis and management.     Charlott Rakes, MD, FAAFP. [facility] [place], [place]   04/03/2019, 10:17 AM

## 2019-04-04 ENCOUNTER — Ambulatory Visit (HOSPITAL_BASED_OUTPATIENT_CLINIC_OR_DEPARTMENT_OTHER): Payer: Medicaid Other | Admitting: Physical Medicine & Rehabilitation

## 2019-04-04 ENCOUNTER — Ambulatory Visit: Payer: Medicaid Other | Attending: Family Medicine

## 2019-04-04 VITALS — BP 152/106 | HR 97 | Temp 98.4°F | Ht 64.0 in | Wt 180.0 lb

## 2019-04-04 DIAGNOSIS — I10 Essential (primary) hypertension: Secondary | ICD-10-CM | POA: Diagnosis not present

## 2019-04-04 DIAGNOSIS — Z79899 Other long term (current) drug therapy: Secondary | ICD-10-CM | POA: Diagnosis not present

## 2019-04-04 DIAGNOSIS — R1031 Right lower quadrant pain: Secondary | ICD-10-CM | POA: Diagnosis not present

## 2019-04-04 DIAGNOSIS — E119 Type 2 diabetes mellitus without complications: Secondary | ICD-10-CM | POA: Diagnosis not present

## 2019-04-04 DIAGNOSIS — G8929 Other chronic pain: Secondary | ICD-10-CM

## 2019-04-04 DIAGNOSIS — M542 Cervicalgia: Secondary | ICD-10-CM | POA: Diagnosis not present

## 2019-04-04 DIAGNOSIS — E1165 Type 2 diabetes mellitus with hyperglycemia: Secondary | ICD-10-CM

## 2019-04-04 DIAGNOSIS — F419 Anxiety disorder, unspecified: Secondary | ICD-10-CM | POA: Diagnosis not present

## 2019-04-04 DIAGNOSIS — K860 Alcohol-induced chronic pancreatitis: Secondary | ICD-10-CM | POA: Diagnosis not present

## 2019-04-04 DIAGNOSIS — F319 Bipolar disorder, unspecified: Secondary | ICD-10-CM | POA: Diagnosis not present

## 2019-04-04 DIAGNOSIS — Z7984 Long term (current) use of oral hypoglycemic drugs: Secondary | ICD-10-CM | POA: Diagnosis not present

## 2019-04-04 DIAGNOSIS — M533 Sacrococcygeal disorders, not elsewhere classified: Secondary | ICD-10-CM | POA: Diagnosis not present

## 2019-04-04 DIAGNOSIS — G5701 Lesion of sciatic nerve, right lower limb: Secondary | ICD-10-CM | POA: Diagnosis not present

## 2019-04-04 DIAGNOSIS — M25561 Pain in right knee: Secondary | ICD-10-CM | POA: Diagnosis not present

## 2019-04-04 DIAGNOSIS — M545 Low back pain: Secondary | ICD-10-CM | POA: Diagnosis present

## 2019-04-04 DIAGNOSIS — K900 Celiac disease: Secondary | ICD-10-CM | POA: Diagnosis not present

## 2019-04-04 DIAGNOSIS — K589 Irritable bowel syndrome without diarrhea: Secondary | ICD-10-CM | POA: Diagnosis not present

## 2019-04-04 DIAGNOSIS — M25552 Pain in left hip: Secondary | ICD-10-CM | POA: Diagnosis not present

## 2019-04-04 DIAGNOSIS — M25551 Pain in right hip: Secondary | ICD-10-CM | POA: Diagnosis not present

## 2019-04-04 NOTE — Patient Instructions (Signed)
You had a radio frequency procedure today This was done to alleviate joint pain in your lumbar area We injected lidocaine which is a local anesthetic.  You may experience soreness at the injection sites. You may also experienced some irritation of the nerves that were heated I'm recommending ice for 30 minutes every 2 hours as needed for the next 24-48 hours

## 2019-04-04 NOTE — Progress Notes (Signed)
  PROCEDURE RECORD [facility]   Name: Taka Gaffke DOB:28-Nov-1956 MRN: [id number]  Date:04/04/2019  Physician: Claudette Laws, MD    Nurse/CMA: Nedra Hai CMA  Allergies:  Allergies  Allergen Reactions  . Morphine And Related Anxiety    Patient states, " it makes me  feel like I am crawling out of my skin"    Consent Signed: Yes.    Is patient diabetic? Yes.    CBG today? 183  Pregnant: No. LMP: No LMP recorded. (age 63-55)  Anticoagulants: no Anti-inflammatory: yes (Ibuprofen) Antibiotics: no  Procedure:Right L5 Dorsal Ramus S1-2-3 Lateral Branch Block Radiofrequency Position: Prone Start Time: 10:44am End Time:11:12am Fluoro Time: 37  RN/CMA Nedra Hai, CMA Ryler Laskowski, CMA    Time 10:20am 11:14am    BP 152/106 157/101    Pulse 97 87    Respirations 16 16    O2 Sat 95 98    S/S 6 6    Pain Level 6/10 3/10     D/C home with boyfriend, patient A & O X 3, D/C instructions reviewed, and sits independently.

## 2019-04-04 NOTE — Progress Notes (Signed)
Sacroiliac radio frequency ablation under fluoroscopic guidance This consists of L5 dorsal ramus radio frequency ablation plus neuro lysis of S1-S2 -S3 lateral branches  Indication is sacroiliac pain which has improved temporarily onby at least 50% following sacroiliac intra-articular injection and L5 , S1,2,3 Lateral branch blocks under fluoroscopic guidance. Pain interferes with self-care and mobility and has failed to respond to conservative measures.  Informed consent was obtained after discussing risks and benefits of the procedure with the patient these include bleeding bruising and infection temporary or permanent paralysis. The patient elects to proceed and has given written consent.  Patient placed prone on fluoroscopy table. Area marked and prepped with Betadine. Fluoroscopic images utilized to guide needle. 25-gauge 1.5 inch needle was used to anesthetize 4 injection points with 2 cc of 1% lidocaine each. Then a 18-gauge 10 cm RF needle with a 10 mm curved active tip was inserted under fluoroscopic guidance first targeting the S1 SA P./sacral ala junction, bone contact made and confirmed with lateral imaging.  motor stimulation at 2 Hz confirm proper needle location followed by injection of one cc of a solution containing   1% lidocaine MPF. Radio frequency 80C for 80 seconds was performed. Then the inferolateral aspect of the S1, S2 and lateral aspect of S3 sacral foramina were targeted. Bone contact made.  motor stim at 2 Hz confirm proper needle location. One ML of the /lidocaine solution was injected into each of 3 sites and radio frequency ablation 80C for 90 seconds was performed. Patient tolerated procedure well. Post procedure instructions given

## 2019-04-05 LAB — CMP14+EGFR
ALT: 17 IU/L (ref 0–32)
AST: 15 IU/L (ref 0–40)
Albumin/Globulin Ratio: 2.1 (ref 1.2–2.2)
Albumin: 4.5 g/dL (ref 3.8–4.8)
Alkaline Phosphatase: 88 IU/L (ref 39–117)
BUN/Creatinine Ratio: 21 (ref 12–28)
BUN: 16 mg/dL (ref 8–27)
Bilirubin Total: 0.3 mg/dL (ref 0.0–1.2)
CO2: 20 mmol/L (ref 20–29)
Calcium: 10 mg/dL (ref 8.7–10.3)
Chloride: 102 mmol/L (ref 96–106)
Creatinine, Ser: 0.76 mg/dL (ref 0.57–1.00)
GFR calc Af Amer: 97 mL/min/{1.73_m2} (ref >59)
GFR calc non Af Amer: 84 mL/min/{1.73_m2} (ref >59)
Globulin, Total: 2.1 g/dL (ref 1.5–4.5)
Glucose: 179 mg/dL — ABNORMAL HIGH (ref 65–99)
Potassium: 4.5 mmol/L (ref 3.5–5.2)
Sodium: 138 mmol/L (ref 134–144)
Total Protein: 6.6 g/dL (ref 6.0–8.5)

## 2019-04-05 LAB — LIPID PANEL
Chol/HDL Ratio: 3.8 ratio (ref 0.0–4.4)
Cholesterol, Total: 233 mg/dL — ABNORMAL HIGH (ref 100–199)
HDL: 62 mg/dL (ref >39)
LDL Calculated: 134 mg/dL — ABNORMAL HIGH (ref 0–99)
Triglycerides: 187 mg/dL — ABNORMAL HIGH (ref 0–149)
VLDL Cholesterol Cal: 37 mg/dL (ref 5–40)

## 2019-04-05 LAB — HEMOGLOBIN A1C
Est. average glucose Bld gHb Est-mCnc: 146 mg/dL
Hgb A1c MFr Bld: 6.7 % — ABNORMAL HIGH (ref 4.8–5.6)

## 2019-04-08 ENCOUNTER — Other Ambulatory Visit: Payer: Self-pay | Admitting: Family Medicine

## 2019-04-08 DIAGNOSIS — E1165 Type 2 diabetes mellitus with hyperglycemia: Secondary | ICD-10-CM

## 2019-04-08 MED ORDER — ATORVASTATIN CALCIUM 80 MG PO TABS: 80.0000 mg | ORAL_TABLET | Freq: Every day | ORAL | 1 refills | Status: DC

## 2019-04-08 MED FILL — ATORVASTATIN 80 MG TABLET: 80 | 90 days supply | Qty: 90 | Fill #0

## 2019-04-08 MED FILL — DULoxetine HCL 60 MG CPEP: 60 | 30 days supply | Qty: 60 | Fill #0

## 2019-04-09 ENCOUNTER — Other Ambulatory Visit: Payer: Self-pay | Admitting: Family Medicine

## 2019-04-09 DIAGNOSIS — F329 Major depressive disorder, single episode, unspecified: Secondary | ICD-10-CM

## 2019-04-09 DIAGNOSIS — F419 Anxiety disorder, unspecified: Principal | ICD-10-CM

## 2019-04-09 MED FILL — PANTOPRAZOLE SOD DR 40 MG T: 40 | 90 days supply | Qty: 90 | Fill #0

## 2019-04-09 MED FILL — GABAPENTIN 300 MG CAPSULE: 300 | 30 days supply | Qty: 60 | Fill #0

## 2019-04-09 MED FILL — BACLOFEN 10 MG TABLET: 10 | 30 days supply | Qty: 90 | Fill #3

## 2019-04-10 ENCOUNTER — Telehealth: Payer: Self-pay

## 2019-04-10 MED FILL — hydrOXYzine HCL 25 MG TABS: 25 | 20 days supply | Qty: 60 | Fill #0

## 2019-04-10 NOTE — Telephone Encounter (Signed)
Patient name and DOB has been verified Patient was informed of lab results. Patient had no questions.  

## 2019-04-10 NOTE — Telephone Encounter (Signed)
-----   Message from Charlott Rakes, MD sent at 04/08/2019  4:20 PM EDT ----- A1c is controlled at 6.7 which is down from 8.3 previously.  Please commended on this.  Cholesterol is elevated.  Encouraged to adhere to low-cholesterol diet and I will increase her Lipitor to 80 mg

## 2019-05-12 ENCOUNTER — Other Ambulatory Visit: Payer: Self-pay | Admitting: Family Medicine

## 2019-05-12 DIAGNOSIS — G8929 Other chronic pain: Secondary | ICD-10-CM

## 2019-05-12 MED FILL — BACLOFEN 10 MG TABLET: 10 | 30 days supply | Qty: 90 | Fill #0

## 2019-05-12 MED FILL — DULoxetine HCL 60 MG CPEP: 60 | 30 days supply | Qty: 60 | Fill #1

## 2019-06-17 MED FILL — hydrOXYzine HCL 25 MG TABS: 25 | 20 days supply | Qty: 60 | Fill #1

## 2019-06-17 MED FILL — busPIRone HCL 15 MG TABS: 15 | 30 days supply | Qty: 90 | Fill #0

## 2019-06-17 MED FILL — DULoxetine HCL 60 MG CPEP: 60 | 30 days supply | Qty: 60 | Fill #0

## 2019-06-17 MED FILL — LATUDA 60 MG TABLET: 60 | 30 days supply | Qty: 30 | Fill #0

## 2019-07-10 ENCOUNTER — Ambulatory Visit: Payer: Medicaid Other | Admitting: Family Medicine

## 2019-07-17 MED FILL — busPIRone HCL 15 MG TABS: 15 | 30 days supply | Qty: 90 | Fill #0

## 2019-07-18 MED FILL — BUPROPION HCL XL 150 MG TAB: 150 | 30 days supply | Qty: 30 | Fill #0

## 2019-07-18 MED FILL — LATUDA 40 MG TABLET: 40 | 30 days supply | Qty: 30 | Fill #0

## 2019-07-18 MED FILL — DULoxetine HCL 60 MG CPEP: 60 | 30 days supply | Qty: 60 | Fill #0

## 2019-07-21 ENCOUNTER — Other Ambulatory Visit: Payer: Self-pay | Admitting: Family Medicine

## 2019-07-21 ENCOUNTER — Other Ambulatory Visit: Payer: Self-pay

## 2019-07-21 ENCOUNTER — Ambulatory Visit: Payer: Medicaid Other | Attending: Family Medicine | Admitting: Family Medicine

## 2019-07-21 DIAGNOSIS — F419 Anxiety disorder, unspecified: Secondary | ICD-10-CM

## 2019-07-21 DIAGNOSIS — G4709 Other insomnia: Secondary | ICD-10-CM

## 2019-07-21 DIAGNOSIS — K2100 Gastro-esophageal reflux disease with esophagitis, without bleeding: Secondary | ICD-10-CM

## 2019-07-21 DIAGNOSIS — I10 Essential (primary) hypertension: Secondary | ICD-10-CM

## 2019-07-21 DIAGNOSIS — R252 Cramp and spasm: Secondary | ICD-10-CM

## 2019-07-21 DIAGNOSIS — M5441 Lumbago with sciatica, right side: Secondary | ICD-10-CM

## 2019-07-21 DIAGNOSIS — E1149 Type 2 diabetes mellitus with other diabetic neurological complication: Secondary | ICD-10-CM

## 2019-07-21 DIAGNOSIS — G8929 Other chronic pain: Secondary | ICD-10-CM

## 2019-07-21 DIAGNOSIS — F329 Major depressive disorder, single episode, unspecified: Secondary | ICD-10-CM

## 2019-07-21 MED ORDER — ATORVASTATIN CALCIUM 80 MG PO TABS: 80.0000 mg | ORAL_TABLET | Freq: Every day | ORAL | 1 refills | Status: DC

## 2019-07-21 MED ORDER — PANTOPRAZOLE SODIUM 40 MG PO TBEC: 40.0000 mg | DELAYED_RELEASE_TABLET | Freq: Every day | ORAL | 3 refills | Status: DC

## 2019-07-21 MED ORDER — GABAPENTIN 300 MG PO CAPS: 300.0000 mg | ORAL_CAPSULE | Freq: Two times a day (BID) | ORAL | 3 refills | Status: DC

## 2019-07-21 MED ORDER — METFORMIN HCL 500 MG PO TABS: 1000.0000 mg | ORAL_TABLET | Freq: Two times a day (BID) | ORAL | 1 refills | Status: DC

## 2019-07-21 MED ORDER — DULOXETINE HCL 60 MG PO CPEP: 60.0000 mg | ORAL_CAPSULE | Freq: Every day | ORAL | 3 refills | Status: DC

## 2019-07-21 MED ORDER — ZOLPIDEM TARTRATE 5 MG PO TABS: 5.0000 mg | ORAL_TABLET | Freq: Every evening | ORAL | 2 refills | Status: DC | PRN

## 2019-07-21 MED ORDER — LOSARTAN POTASSIUM 25 MG PO TABS: 25.0000 mg | ORAL_TABLET | Freq: Every day | ORAL | 1 refills | Status: DC

## 2019-07-21 MED ORDER — BACLOFEN 10 MG PO TABS: 10.0000 mg | ORAL_TABLET | Freq: Three times a day (TID) | ORAL | 2 refills | Status: DC

## 2019-07-21 MED FILL — PANTOPRAZOLE SOD DR 40 MG T: 40 | 90 days supply | Qty: 90 | Fill #0

## 2019-07-21 MED FILL — metFORMIN HCL 500 MG TABS: 500 | 30 days supply | Qty: 120 | Fill #1

## 2019-07-21 MED FILL — LOSARTAN POTASSIUM 25 MG TA: 25 | 90 days supply | Qty: 90 | Fill #1

## 2019-07-21 MED FILL — GABAPENTIN 300 MG CAPSULE: 300 | 90 days supply | Qty: 180 | Fill #0

## 2019-07-21 MED FILL — ATORVASTATIN 80 MG TABLET: 80 | 90 days supply | Qty: 90 | Fill #0

## 2019-07-21 MED FILL — BACLOFEN 10 MG TABLET: 10 | 30 days supply | Qty: 90 | Fill #1

## 2019-07-21 NOTE — Progress Notes (Signed)
Virtual Visit via Telephone Note  I connected with Paige Andrews, on 07/21/2019 at 9:11 AM by telephone due to the COVID-19 pandemic and verified that I am speaking with the correct person using two identifiers.   Consent: I discussed the limitations, risks, security and privacy concerns of performing an evaluation and management service by telephone and the availability of in person appointments. I also discussed with the patient that there may be a patient responsible charge related to this service. The patient expressed understanding and agreed to proceed.   Location of Patient: Home  Location of Provider: Clinic   Persons participating in Telemedicine visit: Reesha Debes Farrington-CMA Dr. Felecia Shelling     History of Present Illness: Paige Andrews  is a 63 year old female with a history of type 2 diabetes mellitus (A1c 6.7) IBS, previous alcohol abuse, pancreatitis, asthma, GERD, Bipolar disorder, anxiety and depression who presents for a follow-up visit.  She is currently under the care of Psychiatry where she receives her medications.  She complains of insomnia since running out of Ambien and feels tired today because she did not sleep well last night.  She also complains of pins-and-needles in her right thumb for the last 1 month.  Of note she was on gabapentin last year but somehow never continued on it. Her reflux symptoms have been uncontrolled and she has to take baking soda at night for relief.  A PPI appears on her med list which she has not been taking. Her back, shoulder muscles continue to spasm (moderate to severe intensity) and despite receiving nerve blocks from Dr. Letta Pate for sacroiliac joint dysfunction and piriformis syndrome and she has not noticed much relief.  She continues to comply with her appointments.  With regards to her diabetes mellitus her last A1c was 6.7 and she denies hypoglycemia.  She had her last eye exam last month. Denies chest pain,  dyspnea and has no additional concerns today.   Past Medical History:  Diagnosis Date  . Anxiety   . Bipolar 1 disorder ([place])   . Celiac disease   . Depression   . Hypertension   . IBS (irritable bowel syndrome)    Allergies  Allergen Reactions  . Morphine And Related Anxiety    Patient states, " it makes me  feel like I am crawling out of my skin"    Current Outpatient Medications on File Prior to Visit  Medication Sig Dispense Refill  . ACCU-CHEK SOFTCLIX LANCETS lancets Use as instructed to test sugar once daily 100 each 12  . aluminum chloride (DRYSOL) 20 % external solution Apply topically at bedtime. 35 mL 1  . atorvastatin (LIPITOR) 80 MG tablet Take 1 tablet (80 mg total) by mouth daily. 90 tablet 1  . baclofen (LIORESAL) 10 MG tablet TAKE 1 TABLET (10 MG TOTAL) BY MOUTH 3 (THREE) TIMES DAILY. 90 tablet 2  . Blood Glucose Monitoring Suppl (ACCU-CHEK AVIVA PLUS) w/Device KIT Use as instructed to test sugar once daily 1 kit 0  . busPIRone (BUSPAR) 10 MG tablet Take 10 mg by mouth 3 (three) times daily.    . cetirizine (ZYRTEC) 10 MG tablet Take 1 tablet (10 mg total) by mouth daily. 30 tablet 1  . diazepam (VALIUM) 10 MG tablet Take 10 mg  tablet as soon as you check in to clinic prior to injection 1 tablet 0  . DULoxetine (CYMBALTA) 60 MG capsule Take 1 capsule (60 mg total) by mouth daily. 30 capsule 3  . glucose  blood (ACCU-CHEK AVIVA PLUS) test strip Use as instructed to test sugar once daily 100 each 12  . hydrOXYzine (ATARAX/VISTARIL) 25 MG tablet TAKE 1 TABLET (25 MG TOTAL) BY MOUTH 3 (THREE) TIMES DAILY AS NEEDED. 60 tablet 2  . ketorolac (ACULAR) 0.4 % SOLN 1 drop 4 (four) times daily.    . Lancets Misc. (ACCU-CHEK FASTCLIX LANCET) KIT Use as directed once daily 1 kit 0  . losartan (COZAAR) 25 MG tablet Take 1 tablet (25 mg total) by mouth daily. 90 tablet 1  . meclizine (ANTIVERT) 25 MG tablet Take 1 tablet (25 mg total) by mouth 3 (three) times daily as needed for  dizziness. 60 tablet 1  . metFORMIN (GLUCOPHAGE) 500 MG tablet Take 2 tablets (1,000 mg total) by mouth 2 (two) times daily with a meal. 360 tablet 1  . ofloxacin (OCUFLOX) 0.3 % ophthalmic solution 1 drop 4 (four) times daily.    Marland Kitchen OLANZapine (ZYPREXA) 5 MG tablet Take 5 mg by mouth at bedtime.    . pantoprazole (PROTONIX) 40 MG tablet Take 1 tablet (40 mg total) by mouth daily. 60 tablet 3  . prednisoLONE acetate (PRED FORTE) 1 % ophthalmic suspension 1 drop 4 (four) times daily.    Marland Kitchen zolpidem (AMBIEN) 5 MG tablet Take 1 tablet (5 mg total) by mouth at bedtime as needed for sleep. 30 tablet 2  . albuterol (PROVENTIL HFA;VENTOLIN HFA) 108 (90 Base) MCG/ACT inhaler Inhale 2 puffs into the lungs every 6 (six) hours as needed for wheezing or shortness of breath. (Patient not taking: Reported on 04/03/2019) 1 Inhaler 1  . fluticasone (FLONASE) 50 MCG/ACT nasal spray Place 2 sprays into both nostrils daily. (Patient not taking: Reported on 04/03/2019) 16 g 1  . gabapentin (NEURONTIN) 300 MG capsule Take 1 capsule (300 mg total) by mouth 2 (two) times daily. (Patient not taking: Reported on 04/03/2019) 60 capsule 3   No current facility-administered medications on file prior to visit.     Observations/Objective: Awake, alert, oriented x3 Not in acute distress  Lab Results  Component Value Date   HGBA1C 6.7 (H) 04/04/2019    CMP Latest Ref Rng & Units 04/04/2019 04/01/2018 04/01/2018  Glucose 65 - 99 mg/dL 179(H) 466(H) 483(H)  BUN 8 - 27 mg/dL 16 25(H) 26(H)  Creatinine 0.57 - 1.00 mg/dL 0.76 1.12(H) 1.20(H)  Sodium 134 - 144 mmol/L 138 134(L) 134(L)  Potassium 3.5 - 5.2 mmol/L 4.5 4.2 4.0  Chloride 96 - 106 mmol/L 102 106 101  CO2 20 - 29 mmol/L 20 18(L) 17(L)  Calcium 8.7 - 10.3 mg/dL 10.0 9.2 10.6(H)  Total Protein 6.0 - 8.5 g/dL 6.6 - 8.0  Total Bilirubin 0.0 - 1.2 mg/dL 0.3 - 0.8  Alkaline Phos 39 - 117 IU/L 88 - 100  AST 0 - 40 IU/L 15 - 47(H)  ALT 0 - 32 IU/L 17 - 71(H)     Lipid  Panel     Component Value Date/Time   CHOL 233 (H) 04/04/2019 0916   TRIG 187 (H) 04/04/2019 0916   HDL 62 04/04/2019 0916   CHOLHDL 3.8 04/04/2019 0916   LDLCALC 134 (H) 04/04/2019 0916    Assessment and Plan: 1. Chronic right-sided low back pain with right-sided sciatica Uncontrolled Advised to continue appointment with physical medicine and rehab - gabapentin (NEURONTIN) 300 MG capsule; Take 1 capsule (300 mg total) by mouth 2 (two) times daily.  Dispense: 60 capsule; Refill: 3 - baclofen (LIORESAL) 10 MG tablet; Take 1 tablet (  10 mg total) by mouth 3 (three) times daily.  Dispense: 90 tablet; Refill: 2  2. Type 2 diabetes mellitus with other neurologic complication, without long-term current use of insulin (HCC) Controlled with A1c of 6.7 - atorvastatin (LIPITOR) 80 MG tablet; Take 1 tablet (80 mg total) by mouth daily.  Dispense: 90 tablet; Refill: 1 - metFORMIN (GLUCOPHAGE) 500 MG tablet; Take 2 tablets (1,000 mg total) by mouth 2 (two) times daily with a meal.  Dispense: 360 tablet; Refill: 1  3. Anxiety and depression Additional medications as per psychiatry - DULoxetine (CYMBALTA) 60 MG capsule; Take 1 capsule (60 mg total) by mouth daily.  Dispense: 30 capsule; Refill: 3  4. Other insomnia Uncontrolled due to running out of medication which have refilled - zolpidem (AMBIEN) 5 MG tablet; Take 1 tablet (5 mg total) by mouth at bedtime as needed for sleep.  Dispense: 30 tablet; Refill: 2  5. Essential hypertension Stable Counseled on blood pressure goal of less than 130/80, low-sodium, DASH diet, medication compliance, 150 minutes of moderate intensity exercise per week. Discussed medication compliance, adverse effects. - losartan (COZAAR) 25 MG tablet; Take 1 tablet (25 mg total) by mouth daily.  Dispense: 90 tablet; Refill: 1  6. Gastroesophageal reflux disease with esophagitis Uncontrolled due to running out of Protonix which I have refilled - pantoprazole (PROTONIX)  40 MG tablet; Take 1 tablet (40 mg total) by mouth daily.  Dispense: 60 tablet; Refill: 3  7. Spasm See #1 above   Follow Up Instructions: Return in about 3 months (around 10/21/2019) for Medical conditions.    I discussed the assessment and treatment plan with the patient. The patient was provided an opportunity to ask questions and all were answered. The patient agreed with the plan and demonstrated an understanding of the instructions.   The patient was advised to call back or seek an in-person evaluation if the symptoms worsen or if the condition fails to improve as anticipated.     I provided 17 minutes total of non-face-to-face time during this encounter including median intraservice time, reviewing previous notes, labs, imaging, medications, management and patient verbalized understanding.     Charlott Rakes, MD, FAAFP. [facility] [place], [place]   07/21/2019, 9:11 AM

## 2019-07-21 NOTE — Progress Notes (Signed)
Patient has been called and DOB has been verified. Patient has been screened and transferred to PCP to start phone visit.    Patient is having pain in shoulders and legs.  Patient has been having headaches.  Ambien goes to [facility].

## 2019-07-22 ENCOUNTER — Telehealth: Payer: Self-pay

## 2019-07-22 ENCOUNTER — Other Ambulatory Visit: Payer: Self-pay | Admitting: Family Medicine

## 2019-07-22 DIAGNOSIS — G4709 Other insomnia: Secondary | ICD-10-CM

## 2019-07-22 MED ORDER — ZOLPIDEM TARTRATE 5 MG PO TABS: 5.0000 mg | ORAL_TABLET | Freq: Every evening | ORAL | 2 refills | Status: DC | PRN

## 2019-07-22 NOTE — Telephone Encounter (Signed)
Done

## 2019-07-22 NOTE — Telephone Encounter (Signed)
Can we send her Ambien to [facility] on [address].

## 2019-07-22 NOTE — Telephone Encounter (Signed)
AMBIEN controlled substance was sent to [facility] it cannot be filled there.  Please sent to [facility].

## 2019-07-23 NOTE — Telephone Encounter (Signed)
Patient was called and informed that medication has been sent to pharmacy.

## 2019-08-14 MED FILL — BUPROPION HCL XL 150 MG TAB: 150 | 30 days supply | Qty: 30 | Fill #0

## 2019-08-14 MED FILL — LATUDA 40 MG TABLET: 40 | 30 days supply | Qty: 30 | Fill #0

## 2019-08-14 MED FILL — busPIRone HCL 15 MG TABS: 15 | 30 days supply | Qty: 90 | Fill #0

## 2019-08-14 MED FILL — DULoxetine HCL 60 MG CPEP: 60 | 30 days supply | Qty: 60 | Fill #0

## 2019-08-25 MED FILL — LATUDA 40 MG TABLET: 40 | 30 days supply | Qty: 30 | Fill #0

## 2019-09-04 ENCOUNTER — Encounter: Payer: Medicaid Other | Admitting: Physical Medicine & Rehabilitation

## 2019-09-15 ENCOUNTER — Encounter: Payer: Medicaid Other | Attending: Physical Medicine & Rehabilitation | Admitting: Physical Medicine & Rehabilitation

## 2019-09-15 MED FILL — BACLOFEN 10 MG TABLET: 10 | 30 days supply | Qty: 90 | Fill #0

## 2019-09-15 MED FILL — LATUDA 40 MG TABLET: 40 | 30 days supply | Qty: 30 | Fill #0

## 2019-09-15 MED FILL — hydrOXYzine HCL 25 MG TABS: 25 | 20 days supply | Qty: 60 | Fill #2

## 2019-09-15 MED FILL — BUPROPION HCL XL 150 MG TAB: 150 | 30 days supply | Qty: 30 | Fill #0

## 2019-09-15 MED FILL — metFORMIN HCL 500 MG TABS: 500 | 90 days supply | Qty: 360 | Fill #0

## 2019-09-29 ENCOUNTER — Ambulatory Visit (HOSPITAL_COMMUNITY)
Admission: EM | Admit: 2019-09-29 | Discharge: 2019-09-29 | Disposition: A | Discharge status: Home / Self Care | Payer: Medicaid Other | Attending: Family Medicine | Admitting: Family Medicine

## 2019-09-29 ENCOUNTER — Encounter (HOSPITAL_COMMUNITY): Payer: Self-pay

## 2019-09-29 ENCOUNTER — Other Ambulatory Visit: Payer: Self-pay

## 2019-09-29 DIAGNOSIS — I10 Essential (primary) hypertension: Secondary | ICD-10-CM | POA: Diagnosis not present

## 2019-09-29 DIAGNOSIS — R519 Headache, unspecified: Secondary | ICD-10-CM | POA: Diagnosis not present

## 2019-09-29 MED ORDER — LOSARTAN POTASSIUM-HCTZ 50-12.5 MG PO TABS: 1.0000 | ORAL_TABLET | Freq: Every day | ORAL | 1 refills | Status: DC

## 2019-09-29 MED ORDER — BUTALBITAL-APAP-CAFFEINE 50-325-40 MG PO TABS: 1.0000 | ORAL_TABLET | Freq: Four times a day (QID) | ORAL | 0 refills | Status: DC | PRN

## 2019-09-29 MED ORDER — ONDANSETRON HCL 4 MG PO TABS: 4.0000 mg | ORAL_TABLET | Freq: Four times a day (QID) | ORAL | 0 refills | Status: DC

## 2019-09-29 NOTE — Discharge Instructions (Signed)
Stop cozaar ( losartan)  Start hyzaar ( losartan with HCTZ) This will reduce your BP Take the fioricet for headache May take 2 at bedtime if needed Continue meclizine as needed Take zofran if needed nausea Follow up with your PCP

## 2019-09-29 NOTE — ED Triage Notes (Addendum)
Patient presents to Urgent Care with complaints of sinus pressure causing occasional dizziness since 2 weeks ago. Patient reports it has been progressively worse over the last week.

## 2019-09-29 NOTE — ED Provider Notes (Signed)
[facility]    CSN: [id number] Arrival date & time: 09/29/19  1144      History   Chief Complaint Chief Complaint  Patient presents with  . Sinusitis    HPI Paige Andrews is a 63 y.o. female.   HPI  Patient has a long history of headaches.  She states that she has migraine headaches.  She has tension headaches.  Right now she thinks she might have a sinus headache.  She has chronic muscular pain of the right side of her neck.  She is chronically on Lioresal (baclofen) for this.  She has multiple medical problems, and states she is compliant with her treatment.  Her blood pressure is up.  She states it has been up lately.  She questions whether this could be causing her headache. Headache is predominantly on the right side of her head.  She states that she feels pressure on the right side of her head.  She states that she has no photophobia or visual symptoms.  She does have some nausea.  No vomiting.  No numbness or weakness in the arms or legs.  No trouble with strength, dexterity, balance.  No injury. He is on both Flonase and Zyrtec for allergies.  No postnasal drip.  No sore throat.  No fever. Patient is not concerned about coronavirus exposure or infection. Current headache has been present every day for week.  Is better in the morning and worse in the evening.  It is not responding to Tylenol.  She does have nausea.  Past Medical History:  Diagnosis Date  . Anxiety   . Bipolar 1 disorder ([place])   . Celiac disease   . Depression   . Hypertension   . IBS (irritable bowel syndrome)     Patient Active Problem List   Diagnosis Date Noted  . Bipolar 1 disorder ([place]) 04/03/2019  . Anxiety 04/03/2019  . Hypertension 08/26/2018  . Diabetes ([place]) 01/01/2018  . Asthma 09/06/2017  . Depression 06/08/2017  . Back pain 06/08/2017  . Shoulder pain 06/08/2017  . Insomnia 06/08/2017  . IBS (irritable bowel syndrome) 03/30/2017  . GERD (gastroesophageal reflux disease)  03/30/2017  . Constipation 01/23/2017  . Nausea & vomiting 01/21/2017  . Transaminitis 01/21/2017  . 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/21/2017  . Injury of right hand 09/14/2015  . Rash 09/14/2015    Past Surgical History:  Procedure Laterality Date  . carpel tunnel Right   . CESAREAN SECTION    . SHOULDER SURGERY Right     OB History    Gravida  0   Para      Term      Preterm      AB      Living        SAB      TAB      Ectopic      Multiple      Live Births               Home Medications    Prior to Admission medications   Medication Sig Start Date End Date Taking? Authorizing Provider  ACCU-CHEK SOFTCLIX LANCETS lancets Use as instructed to test sugar once daily 08/26/18   Charlott Rakes, MD  aluminum chloride (DRYSOL) 20 % external solution Apply topically at bedtime. 10/16/18   Charlott Rakes, MD  atorvastatin (LIPITOR) 80 MG tablet Take 1 tablet (80 mg total) by mouth daily. 07/21/19   Charlott Rakes, MD  baclofen (LIORESAL) 10 MG tablet Take  1 tablet (10 mg total) by mouth 3 (three) times daily. 07/21/19   Charlott Rakes, MD  Blood Glucose Monitoring Suppl (ACCU-CHEK AVIVA PLUS) w/Device KIT Use as instructed to test sugar once daily 07/23/18   Charlott Rakes, MD  busPIRone (BUSPAR) 10 MG tablet Take 10 mg by mouth 3 (three) times daily.    [provider]  butalbital-acetaminophen-caffeine (FIORICET) 50-325-40 MG tablet Take 1-2 tablets by mouth every 6 (six) hours as needed for headache. 09/29/19 09/28/20  Raylene Everts, MD  cetirizine (ZYRTEC) 10 MG tablet Take 1 tablet (10 mg total) by mouth daily. 04/03/18   Charlott Rakes, MD  DULoxetine (CYMBALTA) 60 MG capsule Take 1 capsule (60 mg total) by mouth daily. 07/21/19   Charlott Rakes, MD  gabapentin (NEURONTIN) 300 MG capsule Take 1 capsule (300 mg total) by mouth 2 (two) times daily. 07/21/19   Charlott Rakes, MD  glucose blood (ACCU-CHEK AVIVA PLUS) test strip Use as instructed to test  sugar once daily 07/23/18   Charlott Rakes, MD  hydrOXYzine (ATARAX/VISTARIL) 25 MG tablet TAKE 1 TABLET (25 MG TOTAL) BY MOUTH 3 (THREE) TIMES DAILY AS NEEDED. 04/10/19   Charlott Rakes, MD  Lancets Misc. (ACCU-CHEK FASTCLIX LANCET) KIT Use as directed once daily 07/29/18   Charlott Rakes, MD  losartan (COZAAR) 25 MG tablet Take 1 tablet (25 mg total) by mouth daily. 07/21/19   Charlott Rakes, MD  losartan-hydrochlorothiazide (HYZAAR) 50-12.5 MG tablet Take 1 tablet by mouth daily. 09/29/19   Raylene Everts, MD  metFORMIN (GLUCOPHAGE) 500 MG tablet Take 2 tablets (1,000 mg total) by mouth 2 (two) times daily with a meal. 07/21/19   Newlin, Enobong, MD  OLANZapine (ZYPREXA) 5 MG tablet Take 5 mg by mouth at bedtime.    [provider]  ondansetron (ZOFRAN) 4 MG tablet Take 1 tablet (4 mg total) by mouth every 6 (six) hours. 09/29/19   Raylene Everts, MD  pantoprazole (PROTONIX) 40 MG tablet Take 1 tablet (40 mg total) by mouth daily. 07/21/19   Charlott Rakes, MD  zolpidem (AMBIEN) 5 MG tablet Take 1 tablet (5 mg total) by mouth at bedtime as needed for sleep. 07/22/19   Charlott Rakes, MD  albuterol (PROVENTIL HFA;VENTOLIN HFA) 108 (90 Base) MCG/ACT inhaler Inhale 2 puffs into the lungs every 6 (six) hours as needed for wheezing or shortness of breath. Patient not taking: Reported on 04/03/2019 07/23/18 09/29/19  Charlott Rakes, MD    Family History Family History  Problem Relation Age of Onset  . Dementia Mother   . Breast cancer Mother   . Pancreatitis Father   . Dementia Father     Social History Social History   Tobacco Use  . Smoking status: Never Smoker  . Smokeless tobacco: Never Used  Substance Use Topics  . 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 Drug use: No     Allergies   Morphine and related   Review of Systems Review of Systems  Constitutional: Negative for chills and fever.  HENT: Positive  for hearing loss. Negative for ear pain and sore throat.        Chronic  Eyes: Negative for photophobia, pain and visual disturbance.  Respiratory: Negative for cough and shortness of breath.   Cardiovascular: Negative for chest pain and palpitations.  Gastrointestinal: Positive for nausea. Negative for abdominal pain and vomiting.  Genitourinary: Negative for dysuria and hematuria.  Musculoskeletal: Negative for arthralgias and back pain.  Skin: Negative for color change and rash.  Neurological: Positive for dizziness and headaches. Negative for seizures and syncope.  Psychiatric/Behavioral: Positive for sleep disturbance. The patient is not nervous/anxious.        Chronic  All other systems reviewed and are negative.    Physical Exam Triage Vital Signs ED Triage Vitals  Enc Vitals Group     BP 09/29/19 1302 (!) 178/105     Pulse Rate 09/29/19 1302 (!) 105     Resp 09/29/19 1302 16     Temp 09/29/19 1302 98.2 F (36.8 C)     Temp Source 09/29/19 1302 Oral     SpO2 09/29/19 1302 100 %     Weight --      Height --      Head Circumference --      Peak Flow --      Pain Score 09/29/19 1300 8     Pain Loc --      Pain Edu? --      Excl. in [place]? --    No data found.  Updated Vital Signs BP (!) 178/105 (BP Location: Left Arm) Comment: pt has not been BP medication compliant regularly  Pulse (!) 105   Temp 98.2 F (36.8 C) (Oral)   Resp 16   LMP 12/11/2014 (Approximate)   SpO2 100%      Physical Exam Constitutional:      General: She is not in acute distress.    Appearance: She is well-developed. She is not toxic-appearing.  HENT:     Head: Normocephalic and atraumatic.     Right Ear: Tympanic membrane, ear canal and external ear normal.     Left Ear: Tympanic membrane, ear canal and external ear normal.     Nose: Nose normal.     Comments: No tenderness to palpation of sinuses    Mouth/Throat:     Mouth: Mucous membranes are moist.     Pharynx: No posterior  oropharyngeal erythema.  Eyes:     Extraocular Movements: Extraocular movements intact.     Conjunctiva/sclera: Conjunctivae normal.     Pupils: Pupils are equal, round, and reactive to light.  Neck:     Musculoskeletal: Normal range of motion.  Cardiovascular:     Rate and Rhythm: Normal rate and regular rhythm.     Heart sounds: Normal heart sounds.  Pulmonary:     Effort: Pulmonary effort is normal. No respiratory distress.     Breath sounds: Normal breath sounds. No wheezing or rales.  Abdominal:     General: There is no distension.     Palpations: Abdomen is soft.  Musculoskeletal: Normal range of motion.  Lymphadenopathy:     Cervical: No cervical adenopathy.  Skin:    General: Skin is warm and dry.  Neurological:     General: No focal deficit present.     Mental Status: She is alert.     Motor: No weakness.     Coordination: Coordination normal.     Gait: Gait normal.     Deep Tendon Reflexes: Reflexes normal.  Psychiatric:        Mood and Affect: Mood normal.        Behavior: Behavior normal.      UC Treatments / Results  Labs (all labs ordered are listed, but only abnormal results are displayed) Labs Reviewed - No data to display  EKG   Radiology No results found.  Procedures  Procedures (including critical care time)  Medications Ordered in UC Medications - No data to display  Initial Impression / Assessment and Plan / UC Course  I have reviewed the triage vital signs and the nursing notes.  Pertinent labs & imaging results that were available during my care of the patient were reviewed by me and considered in my medical decision making (see chart for details).     Patient's blood pressure is elevated.  We will add hydrochlorothiazide to her Cozaar. Patient's tried Tylenol and ibuprofen for her headache.  Not better.  I will give her a limited number of Fioricet. She may continue meclizine. Zofran is given for the nausea. She is asked to  follow-up with her PCP for additional work-up. She specifically asked if she has a brain tumor.  I told her that I did not hear anything on history or find anything on physical examination that would suggest this. Final Clinical Impressions(s) / UC Diagnoses   Final diagnoses:  Bad headache  Essential hypertension     Discharge Instructions     Stop cozaar ( losartan)  Start hyzaar ( losartan with HCTZ) This will reduce your BP Take the fioricet for headache May take 2 at bedtime if needed Continue meclizine as needed Take zofran if needed nausea Follow up with your PCP   ED Prescriptions    Medication Sig Dispense Auth. Provider   butalbital-acetaminophen-caffeine (FIORICET) 50-325-40 MG tablet Take 1-2 tablets by mouth every 6 (six) hours as needed for headache. 20 tablet Raylene Everts, MD   ondansetron (ZOFRAN) 4 MG tablet Take 1 tablet (4 mg total) by mouth every 6 (six) hours. 12 tablet Raylene Everts, MD   losartan-hydrochlorothiazide [facility]) 50-12.5 MG tablet Take 1 tablet by mouth daily. 30 tablet Raylene Everts, MD     I have reviewed the PDMP during this encounter.   Raylene Everts, MD 09/29/19 [phone]

## 2019-10-22 ENCOUNTER — Other Ambulatory Visit: Payer: Self-pay

## 2019-10-22 ENCOUNTER — Encounter: Payer: Self-pay | Admitting: Family Medicine

## 2019-10-22 ENCOUNTER — Ambulatory Visit: Payer: Medicaid Other | Attending: Family Medicine | Admitting: Family Medicine

## 2019-10-22 VITALS — BP 136/85 | HR 100 | Temp 98.0°F | Ht 64.0 in | Wt 186.0 lb

## 2019-10-22 DIAGNOSIS — Z23 Encounter for immunization: Secondary | ICD-10-CM

## 2019-10-22 DIAGNOSIS — F32A Depression, unspecified: Secondary | ICD-10-CM

## 2019-10-22 DIAGNOSIS — M5441 Lumbago with sciatica, right side: Secondary | ICD-10-CM | POA: Diagnosis not present

## 2019-10-22 DIAGNOSIS — K900 Celiac disease: Secondary | ICD-10-CM | POA: Diagnosis not present

## 2019-10-22 DIAGNOSIS — G4709 Other insomnia: Secondary | ICD-10-CM | POA: Diagnosis not present

## 2019-10-22 DIAGNOSIS — M1990 Unspecified osteoarthritis, unspecified site: Secondary | ICD-10-CM | POA: Diagnosis not present

## 2019-10-22 DIAGNOSIS — J45909 Unspecified asthma, uncomplicated: Secondary | ICD-10-CM | POA: Diagnosis not present

## 2019-10-22 DIAGNOSIS — Z7984 Long term (current) use of oral hypoglycemic drugs: Secondary | ICD-10-CM | POA: Diagnosis not present

## 2019-10-22 DIAGNOSIS — M25511 Pain in right shoulder: Secondary | ICD-10-CM | POA: Insufficient documentation

## 2019-10-22 DIAGNOSIS — G8929 Other chronic pain: Secondary | ICD-10-CM | POA: Insufficient documentation

## 2019-10-22 DIAGNOSIS — G629 Polyneuropathy, unspecified: Secondary | ICD-10-CM | POA: Insufficient documentation

## 2019-10-22 DIAGNOSIS — K589 Irritable bowel syndrome without diarrhea: Secondary | ICD-10-CM | POA: Insufficient documentation

## 2019-10-22 DIAGNOSIS — E1149 Type 2 diabetes mellitus with other diabetic neurological complication: Secondary | ICD-10-CM | POA: Insufficient documentation

## 2019-10-22 DIAGNOSIS — R110 Nausea: Secondary | ICD-10-CM | POA: Diagnosis not present

## 2019-10-22 DIAGNOSIS — R519 Headache, unspecified: Secondary | ICD-10-CM | POA: Insufficient documentation

## 2019-10-22 DIAGNOSIS — M545 Low back pain: Secondary | ICD-10-CM | POA: Diagnosis not present

## 2019-10-22 DIAGNOSIS — Z79899 Other long term (current) drug therapy: Secondary | ICD-10-CM | POA: Diagnosis not present

## 2019-10-22 DIAGNOSIS — F419 Anxiety disorder, unspecified: Secondary | ICD-10-CM | POA: Diagnosis not present

## 2019-10-22 DIAGNOSIS — F319 Bipolar disorder, unspecified: Secondary | ICD-10-CM | POA: Diagnosis not present

## 2019-10-22 DIAGNOSIS — Z1211 Encounter for screening for malignant neoplasm of colon: Secondary | ICD-10-CM | POA: Insufficient documentation

## 2019-10-22 DIAGNOSIS — I10 Essential (primary) hypertension: Secondary | ICD-10-CM | POA: Insufficient documentation

## 2019-10-22 DIAGNOSIS — K2100 Gastro-esophageal reflux disease with esophagitis, without bleeding: Secondary | ICD-10-CM | POA: Insufficient documentation

## 2019-10-22 DIAGNOSIS — F329 Major depressive disorder, single episode, unspecified: Secondary | ICD-10-CM

## 2019-10-22 DIAGNOSIS — G43809 Other migraine, not intractable, without status migrainosus: Secondary | ICD-10-CM | POA: Diagnosis not present

## 2019-10-22 LAB — POCT GLYCOSYLATED HEMOGLOBIN (HGB A1C): HbA1c, POC (controlled diabetic range): 7.9 % — AB (ref 0.0–7.0)

## 2019-10-22 LAB — GLUCOSE, POCT (MANUAL RESULT ENTRY): POC Glucose: 218 mg/dl — AB (ref 70–99)

## 2019-10-22 MED ORDER — BACLOFEN 10 MG PO TABS: 10.0000 mg | ORAL_TABLET | Freq: Three times a day (TID) | ORAL | 2 refills | Status: DC

## 2019-10-22 MED ORDER — DULOXETINE HCL 60 MG PO CPEP: 60.0000 mg | ORAL_CAPSULE | Freq: Every day | ORAL | 3 refills | Status: DC

## 2019-10-22 MED ORDER — BUTALBITAL-APAP-CAFFEINE 50-325-40 MG PO TABS: 1.0000 | ORAL_TABLET | Freq: Four times a day (QID) | ORAL | 0 refills | Status: DC | PRN

## 2019-10-22 MED ORDER — GLIPIZIDE 5 MG PO TABS: 2.5000 mg | ORAL_TABLET | Freq: Two times a day (BID) | ORAL | 6 refills | Status: DC

## 2019-10-22 MED ORDER — ZOLPIDEM TARTRATE 5 MG PO TABS: 5.0000 mg | ORAL_TABLET | Freq: Every evening | ORAL | 2 refills | Status: DC | PRN

## 2019-10-22 MED ORDER — PANTOPRAZOLE SODIUM 40 MG PO TBEC: 40.0000 mg | DELAYED_RELEASE_TABLET | Freq: Every day | ORAL | 3 refills | Status: DC

## 2019-10-22 MED ORDER — ATORVASTATIN CALCIUM 80 MG PO TABS: 80.0000 mg | ORAL_TABLET | Freq: Every day | ORAL | 1 refills | Status: DC

## 2019-10-22 MED ORDER — MELOXICAM 7.5 MG PO TABS: 7.5000 mg | ORAL_TABLET | Freq: Every day | ORAL | 1 refills | Status: DC

## 2019-10-22 MED ORDER — METFORMIN HCL 500 MG PO TABS: 1000.0000 mg | ORAL_TABLET | Freq: Two times a day (BID) | ORAL | 1 refills | Status: DC

## 2019-10-22 MED ORDER — TOPIRAMATE 50 MG PO TABS: 50.0000 mg | ORAL_TABLET | Freq: Two times a day (BID) | ORAL | 3 refills | Status: DC

## 2019-10-22 MED ORDER — GABAPENTIN 300 MG PO CAPS: 300.0000 mg | ORAL_CAPSULE | Freq: Two times a day (BID) | ORAL | 3 refills | Status: DC

## 2019-10-22 MED ORDER — LOSARTAN POTASSIUM-HCTZ 50-12.5 MG PO TABS: 1.0000 | ORAL_TABLET | Freq: Every day | ORAL | 6 refills | Status: DC

## 2019-10-22 MED FILL — ATORVASTATIN 80 MG TABLET: 80 | 90 days supply | Qty: 90 | Fill #0

## 2019-10-22 MED FILL — MELOXICAM 7.5 MG TABLET: 7.5 | 30 days supply | Qty: 30 | Fill #0

## 2019-10-22 MED FILL — GABAPENTIN 300 MG CAPSULE: 300 | 90 days supply | Qty: 180 | Fill #0

## 2019-10-22 MED FILL — LOSARTAN-HCTZ 50-12.5 MG TA: 50-12.5 | 90 days supply | Qty: 90 | Fill #0

## 2019-10-22 MED FILL — BUTALB-ACETAMIN-CAFF 50-325: 50-325-40 | 5 days supply | Qty: 20 | Fill #0

## 2019-10-22 MED FILL — glipiZIDE 5 MG TABS: 5 | 90 days supply | Qty: 90 | Fill #0

## 2019-10-22 MED FILL — TOPIRAMATE 50 MG TABLET: 50 | 90 days supply | Qty: 180 | Fill #0

## 2019-10-22 MED FILL — DULoxetine HCL 60 MG CPEP: 60 | 90 days supply | Qty: 90 | Fill #0

## 2019-10-22 MED FILL — BACLOFEN 10 MG TABLET: 10 | 90 days supply | Qty: 270 | Fill #0

## 2019-10-22 MED FILL — PANTOPRAZOLE SOD DR 40 MG T: 40 | 90 days supply | Qty: 90 | Fill #0

## 2019-10-22 NOTE — Patient Instructions (Signed)

## 2019-10-22 NOTE — Progress Notes (Signed)
Subjective:  Patient ID: Paige Andrews, female    DOB: 12-18-1955  Age: 63 y.o. MRN: [id number]  CC: Diabetes   HPI Cincere Deprey is a 63 year old female with a history of type 2 diabetes mellitus (A1c 7.9) IBS, previous alcohol abuse, pancreatitis, asthma, GERD, Bipolar disorder, anxiety and depression who presents for a follow-up visit Her A1c is 7.9 which is up from 6.7 previously and she endorses eating a lot of rice as she ran out of food stamps and could not afford healthy foods.  She denies hypoglycemic episodes, blurry vision but does have neuropathy which is controlled. She does not get much exercise  She complains of headache which occur 3-4 times a week with associated nausea, photophobia and phonophobia and she sometimes feels off balance when she has it.  She had an ED visit 3 weeks ago for the same and was prescribed Fioricet which has been somewhat helpful. She also has chronic right shoulder pain and pain on her right lower back.  Currently being followed by Dr. Joycelyn Schmid rehab has received epidural spinal injections states he has not addressed in shoulder yet. Her bipolar disorder, anxiety and depression are stable and managed by psych. Reflux is also controlled.  Past Medical History:  Diagnosis Date  . Anxiety   . Bipolar 1 disorder ([place])   . Celiac disease   . Depression   . Hypertension   . IBS (irritable bowel syndrome)     Past Surgical History:  Procedure Laterality Date  . carpel tunnel Right   . CESAREAN SECTION    . SHOULDER SURGERY Right     Family History  Problem Relation Age of Onset  . Dementia Mother   . Breast cancer Mother   . Pancreatitis Father   . Dementia Father     Allergies  Allergen Reactions  . Morphine And Related Anxiety    Patient states, " it makes me  feel like I am crawling out of my skin"    Outpatient Medications Prior to Visit  Medication Sig Dispense Refill  . aluminum chloride (DRYSOL) 20 % external solution Apply  topically at bedtime. 35 mL 1  . Blood Glucose Monitoring Suppl (ACCU-CHEK AVIVA PLUS) w/Device KIT Use as instructed to test sugar once daily 1 kit 0  . busPIRone (BUSPAR) 10 MG tablet Take 10 mg by mouth 3 (three) times daily.    . cetirizine (ZYRTEC) 10 MG tablet Take 1 tablet (10 mg total) by mouth daily. 30 tablet 1  . glucose blood (ACCU-CHEK AVIVA PLUS) test strip Use as instructed to test sugar once daily 100 each 12  . hydrOXYzine (ATARAX/VISTARIL) 25 MG tablet TAKE 1 TABLET (25 MG TOTAL) BY MOUTH 3 (THREE) TIMES DAILY AS NEEDED. 60 tablet 2  . Lancets Misc. (ACCU-CHEK FASTCLIX LANCET) KIT Use as directed once daily 1 kit 0  . ondansetron (ZOFRAN) 4 MG tablet Take 1 tablet (4 mg total) by mouth every 6 (six) hours. 12 tablet 0  . atorvastatin (LIPITOR) 80 MG tablet Take 1 tablet (80 mg total) by mouth daily. 90 tablet 1  . baclofen (LIORESAL) 10 MG tablet Take 1 tablet (10 mg total) by mouth 3 (three) times daily. 90 tablet 2  . butalbital-acetaminophen-caffeine (FIORICET) 50-325-40 MG tablet Take 1-2 tablets by mouth every 6 (six) hours as needed for headache. 20 tablet 0  . DULoxetine (CYMBALTA) 60 MG capsule Take 1 capsule (60 mg total) by mouth daily. 30 capsule 3  . gabapentin (NEURONTIN) 300  MG capsule Take 1 capsule (300 mg total) by mouth 2 (two) times daily. 60 capsule 3  . losartan-hydrochlorothiazide (HYZAAR) 50-12.5 MG tablet Take 1 tablet by mouth daily. 30 tablet 1  . metFORMIN (GLUCOPHAGE) 500 MG tablet Take 2 tablets (1,000 mg total) by mouth 2 (two) times daily with a meal. 360 tablet 1  . pantoprazole (PROTONIX) 40 MG tablet Take 1 tablet (40 mg total) by mouth daily. 60 tablet 3  . zolpidem (AMBIEN) 5 MG tablet Take 1 tablet (5 mg total) by mouth at bedtime as needed for sleep. 30 tablet 2  . ACCU-CHEK SOFTCLIX LANCETS lancets Use as instructed to test sugar once daily 100 each 12  . OLANZapine (ZYPREXA) 5 MG tablet Take 5 mg by mouth at bedtime.    Marland Kitchen losartan (COZAAR)  25 MG tablet Take 1 tablet (25 mg total) by mouth daily. (Patient not taking: Reported on 10/22/2019) 90 tablet 1   No facility-administered medications prior to visit.      ROS Review of Systems  Constitutional: Negative for activity change, appetite change and fatigue.  HENT: Negative for congestion, sinus pressure and sore throat.   Eyes: Negative for visual disturbance.  Respiratory: Negative for cough, chest tightness, shortness of breath and wheezing.   Cardiovascular: Negative for chest pain and palpitations.  Gastrointestinal: Negative for abdominal distention, abdominal pain and constipation.  Endocrine: Negative for polydipsia.  Genitourinary: Negative for dysuria and frequency.  Musculoskeletal:       See hpi  Skin: Negative for rash.  Neurological: Positive for headaches. Negative for tremors, light-headedness and numbness.  Hematological: Does not bruise/bleed easily.  Psychiatric/Behavioral: Negative for agitation and behavioral problems.    Objective:  BP 136/85   Pulse 100   Temp 98 F (36.7 C) (Oral)   Ht _0  (1.626 m)   Wt 186 lb (84.4 kg)   LMP 12/11/2014 (Approximate)   SpO2 95%   BMI 31.93 kg/m   BP/Weight 10/22/2019 09/29/2019 3/71/6967  Systolic BP 893 810 175  Diastolic BP 85 102 585  Wt. (Lbs) 186 - 180  BMI 31.93 - 30.9      Physical Exam Constitutional:      Appearance: She is well-developed.  Neck:     Vascular: No JVD.  Cardiovascular:     Rate and Rhythm: Normal rate.     Heart sounds: Normal heart sounds. No murmur.  Pulmonary:     Effort: Pulmonary effort is normal.     Breath sounds: Normal breath sounds. No wheezing or rales.  Chest:     Chest wall: No tenderness.  Abdominal:     General: Bowel sounds are normal. There is no distension.     Palpations: Abdomen is soft. There is no mass.     Tenderness: There is no abdominal tenderness.  Musculoskeletal: Normal range of motion.     Right lower leg: No edema.     Left  lower leg: No edema.     Comments: Slight tenderness on palpation of lumbar spine; negative straight leg raise bilaterally  Neurological:     Mental Status: She is alert and oriented to person, place, and time.  Psychiatric:        Mood and Affect: Mood normal.     CMP Latest Ref Rng & Units 04/04/2019 04/01/2018 04/01/2018  Glucose 65 - 99 mg/dL 179(H) 466(H) 483(H)  BUN 8 - 27 mg/dL 16 25(H) 26(H)  Creatinine 0.57 - 1.00 mg/dL 0.76 1.12(H) 1.20(H)  Sodium 134 -  144 mmol/L 138 134(L) 134(L)  Potassium 3.5 - 5.2 mmol/L 4.5 4.2 4.0  Chloride 96 - 106 mmol/L 102 106 101  CO2 20 - 29 mmol/L 20 18(L) 17(L)  Calcium 8.7 - 10.3 mg/dL 10.0 9.2 10.6(H)  Total Protein 6.0 - 8.5 g/dL 6.6 - 8.0  Total Bilirubin 0.0 - 1.2 mg/dL 0.3 - 0.8  Alkaline Phos 39 - 117 IU/L 88 - 100  AST 0 - 40 IU/L 15 - 47(H)  ALT 0 - 32 IU/L 17 - 71(H)    Lipid Panel     Component Value Date/Time   CHOL 233 (H) 04/04/2019 0916   TRIG 187 (H) 04/04/2019 0916   HDL 62 04/04/2019 0916   CHOLHDL 3.8 04/04/2019 0916   LDLCALC 134 (H) 04/04/2019 0916    CBC    Component Value Date/Time   WBC 12.8 (H) 04/01/2018 0257   RBC 5.09 04/01/2018 0257   HGB 16.5 (H) 04/01/2018 0257   HCT 45.7 04/01/2018 0257   PLT 284 04/01/2018 0257   MCV 89.8 04/01/2018 0257   MCH 32.4 04/01/2018 0257   MCHC 36.1 (H) 04/01/2018 0257   RDW 12.6 04/01/2018 0257   LYMPHSABS 2.6 04/01/2018 0257   MONOABS 0.9 04/01/2018 0257   EOSABS 0.3 04/01/2018 0257   BASOSABS 0.1 04/01/2018 0257    Lab Results  Component Value Date   HGBA1C 7.9 (A) 10/22/2019    Assessment & Plan:   1. Type 2 diabetes mellitus with other neurologic complication, without long-term current use of insulin (HCC) Uncontrolled with A1c of 7.9 Goal A1c is less than 7 Low-dose glipizide added to regimen and compliance with a diabetic diet and lifestyle modification emphasized - Glucose (CBG) - HgB A1c - atorvastatin (LIPITOR) 80 MG tablet; Take 1 tablet (80  mg total) by mouth daily.  Dispense: 90 tablet; Refill: 1 - metFORMIN (GLUCOPHAGE) 500 MG tablet; Take 2 tablets (1,000 mg total) by mouth 2 (two) times daily with a meal.  Dispense: 360 tablet; Refill: 1  2. Chronic right-sided low back pain with right-sided sciatica Uncontrolled Status post sacroiliac injections by rehab Continue back exercises - baclofen (LIORESAL) 10 MG tablet; Take 1 tablet (10 mg total) by mouth 3 (three) times daily.  Dispense: 90 tablet; Refill: 2 - gabapentin (NEURONTIN) 300 MG capsule; Take 1 capsule (300 mg total) by mouth 2 (two) times daily.  Dispense: 60 capsule; Refill: 3  3. Anxiety and depression Stable Management as per psych - DULoxetine (CYMBALTA) 60 MG capsule; Take 1 capsule (60 mg total) by mouth daily.  Dispense: 30 capsule; Refill: 3  4. Gastroesophageal reflux disease with esophagitis Controlled - pantoprazole (PROTONIX) 40 MG tablet; Take 1 tablet (40 mg total) by mouth daily.  Dispense: 60 tablet; Refill: 3  5. Other insomnia Doing well on Ambien - zolpidem (AMBIEN) 5 MG tablet; Take 1 tablet (5 mg total) by mouth at bedtime as needed for sleep.  Dispense: 30 tablet; Refill: 2  6. Other migraine without status migrainosus, not intractable Uncontrolled Cholesterol, next discussed - butalbital-acetaminophen-caffeine (FIORICET) 50-325-40 MG tablet; Take 1 tablet by mouth every 6 (six) hours as needed for headache.  Dispense: 20 tablet; Refill: 0 - topiramate (TOPAMAX) 50 MG tablet; Take 1 tablet (50 mg total) by mouth 2 (two) times daily.  Dispense: 60 tablet; Refill: 3  7. Screening for colon cancer - Ambulatory referral to Gastroenterology  8. Chronic right shoulder pain Osteoarthritis Commence NSAIDS - meloxicam (MOBIC) 7.5 MG tablet; Take 1 tablet (7.5 mg  total) by mouth daily.  Dispense: 30 tablet; Refill: 1   Health Care Maintenance: Previously referred for mammogram and colonoscopy and she will be calling them to set up an  appointment. Meds ordered this encounter  Medications  . glipiZIDE (GLUCOTROL) 5 MG tablet    Sig: Take 0.5 tablets (2.5 mg total) by mouth 2 (two) times daily before a meal.    Dispense:  30 tablet    Refill:  6  . atorvastatin (LIPITOR) 80 MG tablet    Sig: Take 1 tablet (80 mg total) by mouth daily.    Dispense:  90 tablet    Refill:  1    Discontinue previous dose  . baclofen (LIORESAL) 10 MG tablet    Sig: Take 1 tablet (10 mg total) by mouth 3 (three) times daily.    Dispense:  90 tablet    Refill:  2  . butalbital-acetaminophen-caffeine (FIORICET) 50-325-40 MG tablet    Sig: Take 1 tablet by mouth every 6 (six) hours as needed for headache.    Dispense:  20 tablet    Refill:  0  . DULoxetine (CYMBALTA) 60 MG capsule    Sig: Take 1 capsule (60 mg total) by mouth daily.    Dispense:  30 capsule    Refill:  3  . gabapentin (NEURONTIN) 300 MG capsule    Sig: Take 1 capsule (300 mg total) by mouth 2 (two) times daily.    Dispense:  60 capsule    Refill:  3  . losartan-hydrochlorothiazide (HYZAAR) 50-12.5 MG tablet    Sig: Take 1 tablet by mouth daily.    Dispense:  30 tablet    Refill:  6  . pantoprazole (PROTONIX) 40 MG tablet    Sig: Take 1 tablet (40 mg total) by mouth daily.    Dispense:  60 tablet    Refill:  3  . zolpidem (AMBIEN) 5 MG tablet    Sig: Take 1 tablet (5 mg total) by mouth at bedtime as needed for sleep.    Dispense:  30 tablet    Refill:  2  . topiramate (TOPAMAX) 50 MG tablet    Sig: Take 1 tablet (50 mg total) by mouth 2 (two) times daily.    Dispense:  60 tablet    Refill:  3  . metFORMIN (GLUCOPHAGE) 500 MG tablet    Sig: Take 2 tablets (1,000 mg total) by mouth 2 (two) times daily with a meal.    Dispense:  360 tablet    Refill:  1  . meloxicam (MOBIC) 7.5 MG tablet    Sig: Take 1 tablet (7.5 mg total) by mouth daily.    Dispense:  30 tablet    Refill:  1    Follow-up: Return in about 3 months (around 01/22/2020) for Chronic medical  conditions.       Charlott Rakes, MD, FAAFP. [facility] [place], [place]   10/22/2019, 12:11 PM

## 2019-10-22 NOTE — Progress Notes (Signed)
Patient is having pain on right side.(arms, legs shoulder).  Patient was seen in ED for headaches and was given medication.

## 2019-10-23 ENCOUNTER — Encounter: Payer: Self-pay | Admitting: Family Medicine

## 2019-10-29 ENCOUNTER — Encounter: Payer: Self-pay | Admitting: Gastroenterology

## 2019-10-30 ENCOUNTER — Other Ambulatory Visit: Payer: Self-pay | Admitting: Family Medicine

## 2019-10-30 DIAGNOSIS — F329 Major depressive disorder, single episode, unspecified: Secondary | ICD-10-CM

## 2019-10-30 DIAGNOSIS — F419 Anxiety disorder, unspecified: Secondary | ICD-10-CM

## 2019-10-31 MED FILL — hydrOXYzine HCL 25 MG TABS: 25 | 20 days supply | Qty: 60 | Fill #0

## 2019-11-13 ENCOUNTER — Other Ambulatory Visit: Payer: Self-pay | Admitting: Family Medicine

## 2019-11-13 DIAGNOSIS — G4709 Other insomnia: Secondary | ICD-10-CM

## 2019-11-17 MED FILL — glipiZIDE 5 MG TABS: 5 | 90 days supply | Qty: 90 | Fill #0

## 2019-11-17 MED FILL — TOPIRAMATE 50 MG TABLET: 50 | 90 days supply | Qty: 180 | Fill #0

## 2019-11-17 MED FILL — BACLOFEN 10 MG TABLET: 10 | 30 days supply | Qty: 90 | Fill #1

## 2019-11-17 MED FILL — LATUDA 40 MG TABLET: 40 | 30 days supply | Qty: 30 | Fill #1

## 2019-11-17 MED FILL — hydrOXYzine HCL 25 MG TABS: 25 | 20 days supply | Qty: 60 | Fill #0

## 2019-11-17 MED FILL — BUPROPION HCL XL 150 MG TAB: 150 | 30 days supply | Qty: 30 | Fill #1

## 2019-11-18 MED FILL — busPIRone HCL 15 MG TABS: 15 | 30 days supply | Qty: 90 | Fill #0

## 2019-11-18 MED FILL — DULoxetine HCL 60 MG CPEP: 60 | 30 days supply | Qty: 60 | Fill #0

## 2019-11-24 ENCOUNTER — Telehealth: Payer: Self-pay | Admitting: *Deleted

## 2019-11-24 NOTE — Telephone Encounter (Signed)
Patient no show PV. Called patient this morning and no answer. Left message for her to return my call to reschedule the nurse visit before 5 pm today or the colon would be cancelled. I just called the patient again, sounds like the phone was answered but no one spoke and I was unable to leave a message. No show letter mailed today.

## 2019-12-08 ENCOUNTER — Encounter: Payer: Medicaid Other | Admitting: Gastroenterology

## 2019-12-16 ENCOUNTER — Other Ambulatory Visit: Payer: Self-pay | Admitting: Family Medicine

## 2019-12-16 DIAGNOSIS — G4709 Other insomnia: Secondary | ICD-10-CM

## 2019-12-16 MED FILL — LATUDA 40 MG TABLET: 40 | 30 days supply | Qty: 30 | Fill #0

## 2019-12-16 MED FILL — BACLOFEN 10 MG TABLET: 10 | 30 days supply | Qty: 90 | Fill #2

## 2019-12-16 MED FILL — DULoxetine HCL 60 MG CPEP: 60 | 30 days supply | Qty: 60 | Fill #1

## 2019-12-16 MED FILL — hydrOXYzine HCL 25 MG TABS: 25 | 20 days supply | Qty: 60 | Fill #1

## 2019-12-16 MED FILL — GABAPENTIN 300 MG CAPSULE: 300 | 30 days supply | Qty: 60 | Fill #1

## 2020-01-12 MED FILL — BUPROPION HCL XL 150 MG TAB: 150 | 30 days supply | Qty: 30 | Fill #0

## 2020-01-12 MED FILL — hydrOXYzine HCL 50 MG TABS: 50 | 30 days supply | Qty: 90 | Fill #0

## 2020-01-12 MED FILL — LATUDA 40 MG TABLET: 40 | 30 days supply | Qty: 30 | Fill #0

## 2020-01-12 MED FILL — DULoxetine HCL 60 MG CPEP: 60 | 30 days supply | Qty: 60 | Fill #0

## 2020-02-02 ENCOUNTER — Ambulatory Visit: Payer: Medicaid Other | Attending: Family Medicine | Admitting: Family Medicine

## 2020-02-02 ENCOUNTER — Other Ambulatory Visit: Payer: Self-pay

## 2020-02-02 ENCOUNTER — Encounter: Payer: Self-pay | Admitting: Family Medicine

## 2020-02-02 VITALS — BP 136/83 | HR 110 | Ht 64.0 in | Wt 186.0 lb

## 2020-02-02 DIAGNOSIS — K900 Celiac disease: Secondary | ICD-10-CM | POA: Diagnosis not present

## 2020-02-02 DIAGNOSIS — Z1211 Encounter for screening for malignant neoplasm of colon: Secondary | ICD-10-CM | POA: Diagnosis not present

## 2020-02-02 DIAGNOSIS — Z791 Long term (current) use of non-steroidal anti-inflammatories (NSAID): Secondary | ICD-10-CM | POA: Diagnosis not present

## 2020-02-02 DIAGNOSIS — Z79899 Other long term (current) drug therapy: Secondary | ICD-10-CM | POA: Diagnosis not present

## 2020-02-02 DIAGNOSIS — J45909 Unspecified asthma, uncomplicated: Secondary | ICD-10-CM | POA: Diagnosis not present

## 2020-02-02 DIAGNOSIS — R195 Other fecal abnormalities: Secondary | ICD-10-CM | POA: Insufficient documentation

## 2020-02-02 DIAGNOSIS — F319 Bipolar disorder, unspecified: Secondary | ICD-10-CM | POA: Diagnosis not present

## 2020-02-02 DIAGNOSIS — M545 Low back pain: Secondary | ICD-10-CM | POA: Diagnosis not present

## 2020-02-02 DIAGNOSIS — M5441 Lumbago with sciatica, right side: Secondary | ICD-10-CM

## 2020-02-02 DIAGNOSIS — K2100 Gastro-esophageal reflux disease with esophagitis, without bleeding: Secondary | ICD-10-CM | POA: Diagnosis not present

## 2020-02-02 DIAGNOSIS — E785 Hyperlipidemia, unspecified: Secondary | ICD-10-CM | POA: Insufficient documentation

## 2020-02-02 DIAGNOSIS — R251 Tremor, unspecified: Secondary | ICD-10-CM

## 2020-02-02 DIAGNOSIS — M79604 Pain in right leg: Secondary | ICD-10-CM | POA: Diagnosis not present

## 2020-02-02 DIAGNOSIS — F419 Anxiety disorder, unspecified: Secondary | ICD-10-CM | POA: Diagnosis not present

## 2020-02-02 DIAGNOSIS — Z7984 Long term (current) use of oral hypoglycemic drugs: Secondary | ICD-10-CM | POA: Insufficient documentation

## 2020-02-02 DIAGNOSIS — M62838 Other muscle spasm: Secondary | ICD-10-CM | POA: Diagnosis not present

## 2020-02-02 DIAGNOSIS — G4709 Other insomnia: Secondary | ICD-10-CM | POA: Diagnosis not present

## 2020-02-02 DIAGNOSIS — E1169 Type 2 diabetes mellitus with other specified complication: Secondary | ICD-10-CM | POA: Diagnosis present

## 2020-02-02 DIAGNOSIS — K589 Irritable bowel syndrome without diarrhea: Secondary | ICD-10-CM | POA: Insufficient documentation

## 2020-02-02 DIAGNOSIS — E1149 Type 2 diabetes mellitus with other diabetic neurological complication: Secondary | ICD-10-CM

## 2020-02-02 DIAGNOSIS — I10 Essential (primary) hypertension: Secondary | ICD-10-CM | POA: Insufficient documentation

## 2020-02-02 DIAGNOSIS — M25511 Pain in right shoulder: Secondary | ICD-10-CM | POA: Insufficient documentation

## 2020-02-02 DIAGNOSIS — G8929 Other chronic pain: Secondary | ICD-10-CM | POA: Insufficient documentation

## 2020-02-02 LAB — POCT GLYCOSYLATED HEMOGLOBIN (HGB A1C): HbA1c, POC (controlled diabetic range): 6.6 % (ref 0.0–7.0)

## 2020-02-02 LAB — GLUCOSE, POCT (MANUAL RESULT ENTRY): POC Glucose: 166 mg/dl — AB (ref 70–99)

## 2020-02-02 MED ORDER — BACLOFEN 10 MG PO TABS: 10.0000 mg | ORAL_TABLET | Freq: Three times a day (TID) | ORAL | 6 refills | Status: DC

## 2020-02-02 MED ORDER — METFORMIN HCL 500 MG PO TABS: 1000.0000 mg | ORAL_TABLET | Freq: Two times a day (BID) | ORAL | 1 refills | Status: DC

## 2020-02-02 MED ORDER — ATORVASTATIN CALCIUM 80 MG PO TABS: 80.0000 mg | ORAL_TABLET | Freq: Every day | ORAL | 1 refills | Status: DC

## 2020-02-02 MED ORDER — GABAPENTIN 300 MG PO CAPS: 300.0000 mg | ORAL_CAPSULE | Freq: Two times a day (BID) | ORAL | 6 refills | Status: DC

## 2020-02-02 MED ORDER — LOSARTAN POTASSIUM-HCTZ 50-12.5 MG PO TABS: 1.0000 | ORAL_TABLET | Freq: Every day | ORAL | 6 refills | Status: DC

## 2020-02-02 MED ORDER — GLIPIZIDE 5 MG PO TABS: 2.5000 mg | ORAL_TABLET | Freq: Two times a day (BID) | ORAL | 6 refills | Status: DC

## 2020-02-02 MED ORDER — PANTOPRAZOLE SODIUM 40 MG PO TBEC: 40.0000 mg | DELAYED_RELEASE_TABLET | Freq: Every day | ORAL | 6 refills | Status: DC

## 2020-02-02 MED ORDER — ZOLPIDEM TARTRATE 5 MG PO TABS: 5.0000 mg | ORAL_TABLET | Freq: Every evening | ORAL | 3 refills | Status: DC | PRN

## 2020-02-02 MED FILL — BACLOFEN 10 MG TABLET: 10 | 90 days supply | Qty: 270 | Fill #0

## 2020-02-02 MED FILL — GABAPENTIN 300 MG CAPSULE: 300 | 30 days supply | Qty: 60 | Fill #0

## 2020-02-02 MED FILL — PANTOPRAZOLE SOD DR 40 MG T: 40 | 60 days supply | Qty: 60 | Fill #0

## 2020-02-02 MED FILL — LOSARTAN-HCTZ 50-12.5 MG TA: 50-12.5 | 90 days supply | Qty: 90 | Fill #0

## 2020-02-02 MED FILL — ATORVASTATIN 80 MG TABLET: 80 | 90 days supply | Qty: 90 | Fill #0

## 2020-02-02 NOTE — Patient Instructions (Signed)
Shoulder Pain Many things can cause shoulder pain, including:  An injury.  Moving the shoulder in the same way again and again (overuse).  Joint pain (arthritis). Pain can come from:  Swelling and irritation (inflammation) of any part of the shoulder.  An injury to the shoulder joint.  An injury to: ? Tissues that connect muscle to bone (tendons). ? Tissues that connect bones to each other (ligaments). ? Bones. Follow these instructions at home: Watch for changes in your symptoms. Let your doctor know about them. Follow these instructions to help with your pain. If you have a sling:  Wear the sling as told by your doctor. Remove it only as told by your doctor.  Loosen the sling if your fingers: ? Tingle. ? Become numb. ? Turn cold and blue.  Keep the sling clean.  If the sling is not waterproof: ? Do not let it get wet. ? Take the sling off when you shower or bathe. Managing pain, stiffness, and swelling   If told, put ice on the painful area: ? Put ice in a plastic bag. ? Place a towel between your skin and the bag. ? Leave the ice on for 20 minutes, 2-3 times a day. Stop putting ice on if it does not help with the pain.  Squeeze a soft ball or a foam pad as much as possible. This prevents swelling in the shoulder. It also helps to strengthen the arm. General instructions  Take over-the-counter and prescription medicines only as told by your doctor.  Keep all follow-up visits as told by your doctor. This is important. Contact a doctor if:  Your pain gets worse.  Medicine does not help your pain.  You have new pain in your arm, hand, or fingers. Get help right away if:  Your arm, hand, or fingers: ? Tingle. ? Are numb. ? Are swollen. ? Are painful. ? Turn white or blue. Summary  Shoulder pain can be caused by many things. These include injury, moving the shoulder in the same away again and again, and joint pain.  Watch for changes in your symptoms.  Let your doctor know about them.  This condition may be treated with a sling, ice, and pain medicine.  Contact your doctor if the pain gets worse or you have new pain. Get help right away if your arm, hand, or fingers tingle or get numb, swollen, or painful.  Keep all follow-up visits as told by your doctor. This is important. This information is not intended to replace advice given to you by your health care provider. Make sure you discuss any questions you have with your health care provider. Document Revised: 06/11/2018 Document Reviewed: 06/11/2018 Elsevier Patient Education  2020 [employer]  

## 2020-02-02 NOTE — Progress Notes (Signed)
Pain in right leg and right shoulder. Pain in back as well.

## 2020-02-02 NOTE — Progress Notes (Signed)
Subjective:  Patient ID: Paige Andrews, female    DOB: 1956/01/08  Age: 64 y.o. MRN: [id number]  CC: Diabetes   HPI Paige Andrews is a 64year old female with a history of type 2 diabetes mellitus (A1c 6.6) IBS, previous alcohol abuse, pancreatitis, asthma, GERD, Bipolar disorder, anxiety and depression who presents for a follow-up visit. Her A1c is 6.6 which is improved from 7.9 previously.  Denies hypoglycemic episodes, visual concerns and is compliant with Glipizide and Metformin.  Today she complains of R shoulder pain which is 5/10 on right side of neck to R shoulder. Back pain which is rated as 5/10 across lower back and this radiates down R thigh into knee. She has had two R shoulder surgeries in the past and now has R shoulder spasms radiating into neck She is s/p SI injections by rehab the last of which was 10 months ago ; states rehab did not address her shoulder. Voltaren gel has been helpful for her pain but she did not find PT beneficial.  Sometimes has tremors which occur at rest and on intention  and she denies family history of tremors, does not drink.  These are described as tremors when she attempts to reach her phone and at other times she just noticed her leg 'jump'. She sees Corporate investment banker for management of bipolar disorder and of note she is on Latuda, Cymbalta, hydroxyzine. Past Medical History:  Diagnosis Date  . Anxiety   . Bipolar 1 disorder ([place])   . Celiac disease   . Depression   . Hypertension   . IBS (irritable bowel syndrome)     Past Surgical History:  Procedure Laterality Date  . carpel tunnel Right   . CESAREAN SECTION    . SHOULDER SURGERY Right     Family History  Problem Relation Age of Onset  . Dementia Mother   . Breast cancer Mother   . Pancreatitis Father   . Dementia Father     Allergies  Allergen Reactions  . Morphine And Related Anxiety    Patient states, " it makes me  feel like I am crawling out of my skin"     Outpatient Medications Prior to Visit  Medication Sig Dispense Refill  . aluminum chloride (DRYSOL) 20 % external solution Apply topically at bedtime. 35 mL 1  . atorvastatin (LIPITOR) 80 MG tablet Take 1 tablet (80 mg total) by mouth daily. 90 tablet 1  . baclofen (LIORESAL) 10 MG tablet Take 1 tablet (10 mg total) by mouth 3 (three) times daily. 90 tablet 2  . busPIRone (BUSPAR) 10 MG tablet Take 10 mg by mouth 3 (three) times daily.    . butalbital-acetaminophen-caffeine (FIORICET) 50-325-40 MG tablet Take 1 tablet by mouth every 6 (six) hours as needed for headache. 20 tablet 0  . cetirizine (ZYRTEC) 10 MG tablet Take 1 tablet (10 mg total) by mouth daily. 30 tablet 1  . DULoxetine (CYMBALTA) 60 MG capsule Take 1 capsule (60 mg total) by mouth daily. 30 capsule 3  . gabapentin (NEURONTIN) 300 MG capsule Take 1 capsule (300 mg total) by mouth 2 (two) times daily. 60 capsule 3  . glipiZIDE (GLUCOTROL) 5 MG tablet Take 0.5 tablets (2.5 mg total) by mouth 2 (two) times daily before a meal. 30 tablet 6  . hydrOXYzine (ATARAX/VISTARIL) 25 MG tablet TAKE 1 TABLET BY MOUTH 3 TIMES DAILY AS NEEDED. 60 tablet 2  . losartan-hydrochlorothiazide (HYZAAR) 50-12.5 MG tablet Take 1 tablet by mouth daily. Bertie  tablet 6  . meloxicam (MOBIC) 7.5 MG tablet Take 1 tablet (7.5 mg total) by mouth daily. 30 tablet 1  . metFORMIN (GLUCOPHAGE) 500 MG tablet Take 2 tablets (1,000 mg total) by mouth 2 (two) times daily with a meal. 360 tablet 1  . ondansetron (ZOFRAN) 4 MG tablet Take 1 tablet (4 mg total) by mouth every 6 (six) hours. 12 tablet 0  . pantoprazole (PROTONIX) 40 MG tablet Take 1 tablet (40 mg total) by mouth daily. 60 tablet 3  . zolpidem (AMBIEN) 5 MG tablet TAKE 1 TABLET BY MOUTH AT BEDTIME AS NEEDED FOR SLEEP 30 tablet 2  . ACCU-CHEK SOFTCLIX LANCETS lancets Use as instructed to test sugar once daily (Patient not taking: Reported on 02/02/2020) 100 each 12  . Blood Glucose Monitoring Suppl (ACCU-CHEK  AVIVA PLUS) w/Device KIT Use as instructed to test sugar once daily (Patient not taking: Reported on 02/02/2020) 1 kit 0  . glucose blood (ACCU-CHEK AVIVA PLUS) test strip Use as instructed to test sugar once daily (Patient not taking: Reported on 02/02/2020) 100 each 12  . Lancets Misc. (ACCU-CHEK FASTCLIX LANCET) KIT Use as directed once daily (Patient not taking: Reported on 02/02/2020) 1 kit 0  . OLANZapine (ZYPREXA) 5 MG tablet Take 5 mg by mouth at bedtime.    . topiramate (TOPAMAX) 50 MG tablet Take 1 tablet (50 mg total) by mouth 2 (two) times daily. (Patient not taking: Reported on 02/02/2020) 60 tablet 3   No facility-administered medications prior to visit.     ROS Review of Systems  Constitutional: Negative for activity change, appetite change and fatigue.  HENT: Negative for congestion, sinus pressure and sore throat.   Eyes: Negative for visual disturbance.  Respiratory: Negative for cough, chest tightness, shortness of breath and wheezing.   Cardiovascular: Negative for chest pain and palpitations.  Gastrointestinal: Negative for abdominal distention, abdominal pain and constipation.  Endocrine: Negative for polydipsia.  Genitourinary: Negative for dysuria and frequency.  Musculoskeletal: Negative for arthralgias and back pain.       See HPI  Skin: Negative for rash.  Neurological: Positive for tremors. Negative for light-headedness and numbness.  Hematological: Does not bruise/bleed easily.  Psychiatric/Behavioral: Negative for agitation and behavioral problems.    Objective:  BP 136/83   Pulse (!) 110   Ht 5' 4"  (1.626 m)   Wt 186 lb (84.4 kg)   LMP 12/11/2014 (Approximate)   SpO2 95%   BMI 31.93 kg/m   BP/Weight 02/02/2020 10/22/2019 94/70/9628  Systolic BP 366 294 765  Diastolic BP 83 85 465  Wt. (Lbs) 186 186 -  BMI 31.93 31.93 -      Physical Exam Constitutional:      Appearance: She is well-developed.  Neck:     Vascular: No JVD.  Cardiovascular:       Rate and Rhythm: Tachycardia present.     Heart sounds: Normal heart sounds. No murmur.  Pulmonary:     Effort: Pulmonary effort is normal.     Breath sounds: Normal breath sounds. No wheezing or rales.  Chest:     Chest wall: No tenderness.  Abdominal:     General: Bowel sounds are normal. There is no distension.     Palpations: Abdomen is soft. There is no mass.     Tenderness: There is no abdominal tenderness.  Musculoskeletal:        General: Normal range of motion.     Right lower leg: No edema.  Left lower leg: No edema.  Neurological:     Mental Status: She is alert and oriented to person, place, and time.  Psychiatric:        Mood and Affect: Mood normal.     CMP Latest Ref Rng & Units 04/04/2019 04/01/2018 04/01/2018  Glucose 65 - 99 mg/dL 179(H) 466(H) 483(H)  BUN 8 - 27 mg/dL 16 25(H) 26(H)  Creatinine 0.57 - 1.00 mg/dL 0.76 1.12(H) 1.20(H)  Sodium 134 - 144 mmol/L 138 134(L) 134(L)  Potassium 3.5 - 5.2 mmol/L 4.5 4.2 4.0  Chloride 96 - 106 mmol/L 102 106 101  CO2 20 - 29 mmol/L 20 18(L) 17(L)  Calcium 8.7 - 10.3 mg/dL 10.0 9.2 10.6(H)  Total Protein 6.0 - 8.5 g/dL 6.6 - 8.0  Total Bilirubin 0.0 - 1.2 mg/dL 0.3 - 0.8  Alkaline Phos 39 - 117 IU/L 88 - 100  AST 0 - 40 IU/L 15 - 47(H)  ALT 0 - 32 IU/L 17 - 71(H)    Lipid Panel     Component Value Date/Time   CHOL 233 (H) 04/04/2019 0916   TRIG 187 (H) 04/04/2019 0916   HDL 62 04/04/2019 0916   CHOLHDL 3.8 04/04/2019 0916   LDLCALC 134 (H) 04/04/2019 0916    CBC    Component Value Date/Time   WBC 12.8 (H) 04/01/2018 0257   RBC 5.09 04/01/2018 0257   HGB 16.5 (H) 04/01/2018 0257   HCT 45.7 04/01/2018 0257   PLT 284 04/01/2018 0257   MCV 89.8 04/01/2018 0257   MCH 32.4 04/01/2018 0257   MCHC 36.1 (H) 04/01/2018 0257   RDW 12.6 04/01/2018 0257   LYMPHSABS 2.6 04/01/2018 0257   MONOABS 0.9 04/01/2018 0257   EOSABS 0.3 04/01/2018 0257   BASOSABS 0.1 04/01/2018 0257    Lab Results  Component  Value Date   HGBA1C 6.6 02/02/2020     Assessment & Plan:  1. Type 2 diabetes mellitus with other neurologic complication, without long-term current use of insulin (HCC) Controlled with A1c of 6.6 Continue current regimen Counseled on Diabetic diet, my plate method, 237 minutes of moderate intensity exercise/week Blood sugar logs with fasting goals of 80-120 mg/dl, random of less than 180 and in the event of sugars less than 60 mg/dl or greater than 400 mg/dl encouraged to notify the clinic. Advised on the need for annual eye exams, annual foot exams, Pneumonia vaccine. - POCT glucose (manual entry) - POCT glycosylated hemoglobin (Hb A1C) - Lipid panel - Basic Metabolic Panel - metFORMIN (GLUCOPHAGE) 500 MG tablet; Take 2 tablets (1,000 mg total) by mouth 2 (two) times daily with a meal.  Dispense: 360 tablet; Refill: 1 - glipiZIDE (GLUCOTROL) 5 MG tablet; Take 0.5 tablets (2.5 mg total) by mouth 2 (two) times daily before a meal.  Dispense: 30 tablet; Refill: 6 - atorvastatin (LIPITOR) 80 MG tablet; Take 1 tablet (80 mg total) by mouth daily.  Dispense: 90 tablet; Refill: 1  2. Tremor Unknown etiology Last thyroid panel was normal Advised to discuss this with psychiatry as she could be having some early form of tardive dyskinesia Essential tremor also a possibility once other organic causes have been ruled out  3. Other insomnia Stable - zolpidem (AMBIEN) 5 MG tablet; Take 1 tablet (5 mg total) by mouth at bedtime as needed. for sleep  Dispense: 30 tablet; Refill: 3  4. Gastroesophageal reflux disease with esophagitis without hemorrhage Controlled - pantoprazole (PROTONIX) 40 MG tablet; Take 1 tablet (40 mg total) by  mouth daily.  Dispense: 60 tablet; Refill: 6  5. Chronic right-sided low back pain with right-sided sciatica Uncontrolled with intermittent flares Status post sacroiliac joint injection in the past Advised to continue home exercise regimen - gabapentin (NEURONTIN)  300 MG capsule; Take 1 capsule (300 mg total) by mouth 2 (two) times daily.  Dispense: 60 capsule; Refill: 6 - baclofen (LIORESAL) 10 MG tablet; Take 1 tablet (10 mg total) by mouth 3 (three) times daily.  Dispense: 90 tablet; Refill: 6  6. Chronic right shoulder pain -Uncontrolled She may benefit from cortisone injections  AMB referral to orthopedics  7. Screening for colon cancer - Fecal occult blood, imunochemical([facility]); Future  8. Essential hypertension Controlled Counseled on blood pressure goal of less than 130/80, low-sodium, DASH diet, medication compliance, 150 minutes of moderate intensity exercise per week. Discussed medication compliance, adverse effects. - losartan-hydrochlorothiazide (HYZAAR) 50-12.5 MG tablet; Take 1 tablet by mouth daily.  Dispense: 30 tablet; Refill: 6  9. Bipolar 1 disorder (HCC) Stable Management as per psych - lurasidone (LATUDA) 40 MG TABS tablet; Take 40 mg by mouth daily with breakfast.  10.  Hyperlipidemia associated with type 2 diabetes mellitus Uncontrolled We will send of lipid panel and adjust regimen accordingly Counseled on low-cholesterol diet.  Health Care Maintenance: needs to schedule mammogram; never followed through with colonoscopy due to inability to tolerate laxative.  Will order stool FIT test.  No orders of the defined types were placed in this encounter.   Follow-up: Return in about 6 months (around 08/01/2020) for chronic medical conditions.       Charlott Rakes, MD, FAAFP. [facility] [place], [place]   02/02/2020, 9:19 AM

## 2020-02-03 LAB — LIPID PANEL
Chol/HDL Ratio: 2.5 ratio (ref 0.0–4.4)
Cholesterol, Total: 136 mg/dL (ref 100–199)
HDL: 54 mg/dL (ref >39)
LDL Chol Calc (NIH): 67 mg/dL (ref 0–99)
Triglycerides: 78 mg/dL (ref 0–149)
VLDL Cholesterol Cal: 15 mg/dL (ref 5–40)

## 2020-02-03 LAB — BASIC METABOLIC PANEL
BUN/Creatinine Ratio: 18 (ref 12–28)
BUN: 17 mg/dL (ref 8–27)
CO2: 22 mmol/L (ref 20–29)
Calcium: 10.5 mg/dL — ABNORMAL HIGH (ref 8.7–10.3)
Chloride: 108 mmol/L — ABNORMAL HIGH (ref 96–106)
Creatinine, Ser: 0.94 mg/dL (ref 0.57–1.00)
GFR calc Af Amer: 75 mL/min/{1.73_m2} (ref >59)
GFR calc non Af Amer: 65 mL/min/{1.73_m2} (ref >59)
Glucose: 138 mg/dL — ABNORMAL HIGH (ref 65–99)
Potassium: 5 mmol/L (ref 3.5–5.2)
Sodium: 143 mmol/L (ref 134–144)

## 2020-02-05 ENCOUNTER — Telehealth: Payer: Self-pay

## 2020-02-05 MED FILL — busPIRone HCL 15 MG TABS: 15 | 30 days supply | Qty: 120 | Fill #0

## 2020-02-05 MED FILL — LATUDA 20 MG TABLET: 20 | 14 days supply | Qty: 14 | Fill #0

## 2020-02-05 MED FILL — DULoxetine HCL 60 MG CPEP: 60 | 60 days supply | Qty: 120 | Fill #0

## 2020-02-05 MED FILL — BUPROPION HCL XL 150 MG TAB: 150 | 60 days supply | Qty: 60 | Fill #0

## 2020-02-05 MED FILL — hydrOXYzine HCL 50 MG TABS: 50 | 60 days supply | Qty: 180 | Fill #0

## 2020-02-05 NOTE — Telephone Encounter (Signed)
Patient name and DOB has been verified Patient was informed of lab results. Patient had no questions.  

## 2020-02-05 NOTE — Telephone Encounter (Signed)
-----   Message from Paige Newlin, MD sent at 02/03/2020  8:22 AM EST ----- Please inform the patient that labs are normal. Thank you. 

## 2020-02-09 ENCOUNTER — Encounter: Payer: Self-pay | Admitting: Orthopaedic Surgery

## 2020-02-09 ENCOUNTER — Ambulatory Visit (INDEPENDENT_AMBULATORY_CARE_PROVIDER_SITE_OTHER): Payer: Medicaid Other | Admitting: Orthopaedic Surgery

## 2020-02-09 ENCOUNTER — Other Ambulatory Visit: Payer: Self-pay

## 2020-02-09 ENCOUNTER — Ambulatory Visit (INDEPENDENT_AMBULATORY_CARE_PROVIDER_SITE_OTHER): Payer: Medicaid Other

## 2020-02-09 VITALS — Ht 64.0 in | Wt 186.0 lb

## 2020-02-09 DIAGNOSIS — G8929 Other chronic pain: Secondary | ICD-10-CM | POA: Diagnosis not present

## 2020-02-09 DIAGNOSIS — M25511 Pain in right shoulder: Secondary | ICD-10-CM | POA: Diagnosis not present

## 2020-02-09 MED FILL — metFORMIN HCL 500 MG TABS: 500 | 90 days supply | Qty: 360 | Fill #1

## 2020-02-09 MED FILL — OLANZapine 5 MG TABS: 5 | 30 days supply | Qty: 30 | Fill #0

## 2020-02-09 MED FILL — hydrOXYzine HCL 25 MG TABS: 25 | 20 days supply | Qty: 60 | Fill #2

## 2020-02-09 MED FILL — glipiZIDE 5 MG TABS: 5 | 90 days supply | Qty: 90 | Fill #1

## 2020-02-09 NOTE — Progress Notes (Signed)
Office Visit Note   Patient: Paige Andrews           Date of Birth: 05-18-1956           MRN: [id number] Visit Date: 02/09/2020              Requested by: Charlott Rakes, MD [place],  [place] [id number] PCP: Charlott Rakes, MD   Assessment & Plan: Visit Diagnoses:  1. Chronic right shoulder pain     Plan: Impression is right scapular trigger point. I recommend combination of heating pad and physical therapy. She is currently already taking baclofen. We did briefly discussed cortisone injection showed these measures fail to provide her with relief. Questions encouraged and answered. Follow-up as needed.  Follow-Up Instructions: Return if symptoms worsen or fail to improve.   Orders:  Orders Placed This Encounter  Procedures  . XR Shoulder Right  . Ambulatory referral to Physical Therapy   No orders of the defined types were placed in this encounter.     Procedures: No procedures performed   Clinical Data: No additional findings.   Subjective: Chief Complaint  Patient presents with  . Right Shoulder - Pain    Patient is a pleasant 64 year old female who comes in for evaluation of chronic posterior right shoulder pain. She states that the pain will radiate into her shoulder and up into the neck. She denies any weakness numbness or tingling. She has good range of motion. Denies any injuries.   Review of Systems  Constitutional: Negative.   HENT: Negative.   Eyes: Negative.   Respiratory: Negative.   Cardiovascular: Negative.   Endocrine: Negative.   Musculoskeletal: Negative.   Neurological: Negative.   Hematological: Negative.   Psychiatric/Behavioral: Negative.   All other systems reviewed and are negative.    Objective: Vital Signs: Ht 5' 4"  (1.626 m)   Wt 186 lb (84.4 kg)   LMP 12/11/2014 (Approximate)   BMI 31.93 kg/m   Physical Exam Vitals and nursing note reviewed.  Constitutional:      Appearance: She is well-developed.    HENT:     Head: Normocephalic and atraumatic.  Pulmonary:     Effort: Pulmonary effort is normal.  Abdominal:     Palpations: Abdomen is soft.  Musculoskeletal:     Cervical back: Neck supple.  Skin:    General: Skin is warm.     Capillary Refill: Capillary refill takes less than 2 seconds.  Neurological:     Mental Status: She is alert and oriented to person, place, and time.  Psychiatric:        Behavior: Behavior normal.        Thought Content: Thought content normal.        Judgment: Judgment normal.     Ortho Exam Right shoulder exam shows full range of motion without pain. She has tenderness along the medial border of the scapula and the rhomboids. Manual muscle testing is normal. Specialty Comments:  No specialty comments available.  Imaging: XR Shoulder Right  Result Date: 02/09/2020 No acute or structural abnormalities    PMFS History: Patient Active Problem List   Diagnosis Date Noted  . Bipolar 1 disorder ([place]) 04/03/2019  . Anxiety 04/03/2019  . Hypertension 08/26/2018  . Diabetes ([place]) 01/01/2018  . Asthma 09/06/2017  . Depression 06/08/2017  . Back pain 06/08/2017  . Shoulder pain 06/08/2017  . Insomnia 06/08/2017  . IBS (irritable bowel syndrome) 03/30/2017  . GERD (gastroesophageal reflux disease) 03/30/2017  .  Constipation 01/23/2017  . Nausea & vomiting 01/21/2017  . Transaminitis 01/21/2017  . 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/21/2017  . Injury of right hand 09/14/2015  . Rash 09/14/2015   Past Medical History:  Diagnosis Date  . Anxiety   . Bipolar 1 disorder ([place])   . Celiac disease   . Depression   . Hypertension   . IBS (irritable bowel syndrome)     Family History  Problem Relation Age of Onset  . Dementia Mother   . Breast cancer Mother   . Pancreatitis Father   . Dementia Father     Past Surgical History:  Procedure Laterality Date  . carpel tunnel Right   . CESAREAN SECTION    . SHOULDER SURGERY Right    Social History    Occupational History  . Not on file  Tobacco Use  . Smoking status: Never Smoker  . Smokeless tobacco: Never Used  Substance and Sexual Activity  . 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 Drug use: No  . Sexual activity: Never

## 2020-02-18 ENCOUNTER — Ambulatory Visit: Payer: Medicaid Other | Attending: Orthopaedic Surgery

## 2020-02-18 ENCOUNTER — Other Ambulatory Visit: Payer: Self-pay

## 2020-02-18 DIAGNOSIS — M25511 Pain in right shoulder: Secondary | ICD-10-CM | POA: Diagnosis present

## 2020-02-18 DIAGNOSIS — G8929 Other chronic pain: Secondary | ICD-10-CM | POA: Diagnosis present

## 2020-02-19 NOTE — Therapy (Signed)
[place], [place], [address] Phone: [phone]   Fax:  [phone]  Physical Therapy Evaluation  Patient Details  Name: Paige Andrews MRN: [id number] Date of Birth: [birth year] Referring Provider (PT): Frankey Shown, MD   Encounter Date: 02/18/2020  PT End of Session - 02/19/20 0629    Visit Number  1    Number of Visits  4    Date for PT Re-Evaluation  03/03/20    Authorization Type  MCD    Authorization Time Period  02/18/20-04/16/20    Authorization - Visit Number  0    Authorization - Number of Visits  17    PT Start Time  1105    PT Stop Time  1150    PT Time Calculation (min)  45 min    Activity Tolerance  Patient tolerated treatment well    Behavior During Therapy  [facility] for tasks assessed/performed       Past Medical History:  Diagnosis Date  . Anxiety   . Bipolar 1 disorder ([place])   . Celiac disease   . Depression   . Hypertension   . IBS (irritable bowel syndrome)     Past Surgical History:  Procedure Laterality Date  . carpel tunnel Right   . CESAREAN SECTION    . SHOULDER SURGERY Right     There were no vitals filed for this visit.   Subjective Assessment - 02/19/20 0605    Subjective  Pt reports chronic R shoulder pain since 1989 following an an arthroscopic surgery. Pt reports a nerve was nicked. Pt describes constant sharp and aching pain along the medial border of the scapula which radiates to her [place] jt., lateral neck and jaw. Various situatios can trigger an increase in pain, but that trigger is not always activity, but it can. Pt states she has insomnia and chronic R leg pain. 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    Pertinent History  stable L2 compression fracture, anxiety, bipolar depression, DM, neuropathy of feet, shoulder surgery 1987/88 arthroscopic,    Limitations  Sitting;Lifting;Standing;Walking;House  hold activities    How long can you sit comfortably?  30 min    How long can you stand comfortably?  15 min    How long can you walk comfortably?  15 min    Diagnostic tests  XR- 02/09/20: No acute or structural abnormalities    Patient Stated Goals  To hav w less shoulder pain and to sleep better.    Currently in Pain?  Yes    Pain Score  5     Pain Location  Shoulder    Pain Orientation  Right;Posterior    Pain Descriptors / Indicators  Aching;Sharp    Pain Type  Chronic pain    Pain Radiating Towards  to neck and jaw    Pain Onset  Other (comment)   32 years ago   Pain Frequency  Constant    Aggravating Factors   Activity and sleeping, but not always. At times for unknown reasons.    Pain Relieving Factors  Rest    Effect of Pain on Daily Activities  Significant    Multiple Pain Sites  Yes    Pain Score  7    Pain Location  Hip    Pain Orientation  Left         [facility] PT Assessment - 02/19/20 0001  Assessment   Medical Diagnosis  R chronic sh pain    Referring Provider (PT)  Frankey Shown, MD    Hand Dominance  Right    Next MD Visit  6 months    Prior Therapy  yes      Precautions   Precautions  None      Restrictions   Weight Bearing Restrictions  No      Balance Screen   Has the patient fallen in the past 6 months  No      [place] residence    Living Arrangements  Spouse/significant other    Type of [place] to enter    Entrance Stairs-Number of Steps  3    Entrance Stairs-Rails  None    Home Layout  Two level    Alternate Level Stairs-Number of Steps  14      Prior Function   Level of Independence  Independent    Vocation  Retired      Associate Professor   Overall Cognitive Status  Within Functional Limits for tasks assessed      Sensation   Light Touch  Impaired Detail   R thumb from carpal tunnel     Coordination   Gross Motor Movements are Fluid and Coordinated  Yes       Posture/Postural Control   Posture/Postural Control  Postural limitations    Postural Limitations  Forward head      ROM / Strength   AROM / PROM / Strength  AROM;Strength      AROM   Overall AROM   Within functional limits for tasks performed    Overall AROM Comments  R GH/scapular AROMs WNLs      Strength   Overall Strength  Within functional limits for tasks performed    Overall Strength Comments  R shoulder strength WNLs for all motions c flex, ext, abd, and ER reproducing pain, especial as testing force was lifted      Special Tests    Special Tests  Rotator Cuff Impingement;Biceps/Labral Tests    Rotator Cuff Impingment tests  Michel Bickers test;Empty Can test    Biceps/Labral tests  Speeds Test      Hawkins-Kennedy test   Findings  --   painful for the genral shoulder   Side  Right      Empty Can test   Findings  --   Painful for the general shoulder, but not weak   Side  Right      Speeds test   findings  Negative    Side  Right                Objective measurements completed on examination: See above findings.              PT Education - 02/19/20 [phone]    Education Details  On support for the R arm in sitting and sleeping to reduce strain. Pt was advised not to sleep c 2 pillows under her when lying on her back if it creates an exaggerated forward flexion.    Person(s) Educated  Patient    Comprehension  Verbalized understanding;Returned demonstration;Verbal cues required;Tactile cues required;Need further instruction       PT Short Term Goals - 02/19/20 0804      PT SHORT TERM GOAL #1   Title  Pt will be Ind in an initial HEP to address R shoulder and  function.    Baseline  not established    Time  3    Period  Weeks    Status  New    Target Date  03/11/20        PT Long Term Goals - 02/19/20 0806      PT LONG TERM GOAL #1   Title  Pt will be ind in a final HEP to address R shoulder pain and function.    Baseline  not  established    Time  8    Period  Weeks    Status  New    Target Date  04/15/20      PT LONG TERM GOAL #2   Title  Pt will report a R shoulder pain range of 1-6/10 with functional movement and sleep vs 3-10/10.    Baseline  3-10/10    Time  8    Period  Weeks    Status  New    Target Date  04/15/20      PT LONG TERM GOAL #3   Title  R shoulder trigger points of the medial scapular border and upper trapezius wil.l demonstrate decreased sensitivity and reactiveness to palpation    Baseline  Reactive with significant pain response    Time  8    Period  Weeks    Target Date  04/15/20             Plan - 02/19/20 7017    Clinical Impression Statement  Pt presents with a chronic R shoulder pain which has been an issue since 1989. Pt's R sh ROM and strength are WNLs. Resistive testing for flex, ext, abd, ER all reproduced shoulder pain, especially as the resistive force was let off. Trigger points were noted along the medial border of the scapularT5-T7 and the upper trapezius with the meial border being the most painful. Palpation of the medial border most closely reprodued the pt's pain.    Personal Factors and Comorbidities  Time since onset of injury/illness/exacerbation;Past/Current Experience;Comorbidity 1;Comorbidity 2    Comorbidities  anxiety, depression    Examination-Activity Limitations  Carry;Dressing;Sleep;Sit;Self Feeding;Transfers;Stand;Lift;Bathing;Bed Mobility    Examination-Participation Restrictions  Cleaning;Shop;Community Activity    Stability/Clinical Decision Making  Evolving/Moderate complexity    Clinical Decision Making  Low    Clinical Presentation due to:  Chronic pain    Rehab Potential  Fair    PT Frequency  1x / week    PT Duration  8 weeks    PT Treatment/Interventions  Ultrasound;Traction;Moist Heat;Therapeutic activities;Therapeutic exercise;Neuromuscular re-education;Manual techniques;Dry needling;Joint Manipulations;Vasopneumatic  Device;Patient/family education    PT Next Visit Plan  Assess education for positioning and support of th R UE in sitting and sleeping. Modalities and manual therapy to address pain and trigger points.       Patient will benefit from skilled therapeutic intervention in order to improve the following deficits and impairments:  Decreased activity tolerance, Increased muscle spasms, Postural dysfunction, Improper body mechanics  Visit Diagnosis: Chronic right shoulder pain - Plan: PT plan of care cert/re-cert     Problem List Patient Active Problem List   Diagnosis Date Noted  . Bipolar 1 disorder ([place]) 04/03/2019  . Anxiety 04/03/2019  . Hypertension 08/26/2018  . Diabetes ([place]) 01/01/2018  . Asthma 09/06/2017  . Depression 06/08/2017  . Back pain 06/08/2017  . Shoulder pain 06/08/2017  . Insomnia 06/08/2017  . IBS (irritable bowel syndrome) 03/30/2017  . GERD (gastroesophageal reflux disease) 03/30/2017  . Constipation 01/23/2017  . Nausea &  vomiting 01/21/2017  . Transaminitis 01/21/2017  . 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/21/2017  . Injury of right hand 09/14/2015  . Rash 09/14/2015    [facility] [address] [place], [place], [address] Phone: [phone]   Fax:  [phone]  Name: Paige Andrews MRN: [id number] Date of Birth: Aug 10, 1956   Gar Ponto MS, PT 02/19/20 8:33 AM

## 2020-03-02 MED FILL — OLANZapine 5 MG TABS: 5 | 60 days supply | Qty: 60 | Fill #0

## 2020-03-02 MED FILL — busPIRone HCL 15 MG TABS: 15 | 60 days supply | Qty: 240 | Fill #0

## 2020-03-04 ENCOUNTER — Ambulatory Visit: Payer: Medicaid Other | Admitting: Physical Therapy

## 2020-03-09 ENCOUNTER — Ambulatory Visit: Payer: Medicaid Other | Admitting: Physical Therapy

## 2020-03-10 ENCOUNTER — Telehealth: Payer: Self-pay | Admitting: Physical Therapy

## 2020-03-10 NOTE — Telephone Encounter (Signed)
Spoke with pt regarding missed appointment on 3/30 @ 9:30. She stated she had previously called and cancelled this appointment and rescheduled appointments into April. I apologized to the pt for the miscommunication and we will plan to see her on 4/6.    Marvelene Stoneberg PT, DPT, LAT, ATC  03/10/20  8:46 AM

## 2020-03-16 ENCOUNTER — Ambulatory Visit: Payer: Medicaid Other | Attending: Orthopaedic Surgery | Admitting: Physical Therapy

## 2020-03-16 ENCOUNTER — Other Ambulatory Visit: Payer: Self-pay

## 2020-03-16 DIAGNOSIS — M25511 Pain in right shoulder: Secondary | ICD-10-CM | POA: Insufficient documentation

## 2020-03-16 DIAGNOSIS — M25651 Stiffness of right hip, not elsewhere classified: Secondary | ICD-10-CM | POA: Insufficient documentation

## 2020-03-16 DIAGNOSIS — M25551 Pain in right hip: Secondary | ICD-10-CM | POA: Insufficient documentation

## 2020-03-16 DIAGNOSIS — G8929 Other chronic pain: Secondary | ICD-10-CM | POA: Diagnosis present

## 2020-03-16 NOTE — Therapy (Signed)
[place], [place], [address] Phone: [phone]   Fax:  [phone]  Physical Therapy Treatment  Patient Details  Name: Keylie Beavers MRN: [id number] Date of Birth: 09/18/1956 Referring Provider (PT): Frankey Shown, MD   Encounter Date: 03/16/2020  PT End of Session - 03/16/20 1016    Visit Number  2    Number of Visits  13    Date for PT Re-Evaluation  04/27/20    Authorization Type  MCD    Authorization Time Period  02/18/20-04/16/20    Authorization - Visit Number  1    Authorization - Number of Visits  3    PT Start Time  1016    PT Stop Time  1100    PT Time Calculation (min)  44 min    Activity Tolerance  Patient tolerated treatment well    Behavior During Therapy  [facility] for tasks assessed/performed       Past Medical History:  Diagnosis Date  . Anxiety   . Bipolar 1 disorder ([place])   . Celiac disease   . Depression   . Hypertension   . IBS (irritable bowel syndrome)     Past Surgical History:  Procedure Laterality Date  . carpel tunnel Right   . CESAREAN SECTION    . SHOULDER SURGERY Right     There were no vitals filed for this visit.  Subjective Assessment - 03/16/20 1016    Subjective  "My shoulder was hurting really bad last night, but it's calmed down." Patient has total ROM, but aches and sharp pain in the shoulder are the biggest concern. She doesn't feel as if the surgeries have been helpful.    Pertinent History  stable L2 compression fracture, anxiety, bipolar depression, DM, neuropathy of feet, shoulder surgery 1987/88 arthroscopic,    Pain Score  5     Pain Location  Shoulder    Pain Orientation  Right;Posterior    Pain Descriptors / Indicators  Aching;Sharp    Pain Radiating Towards  neck and jaw    Pain Onset  More than a month ago    Aggravating Factors   reaching overhead, sleeping    Pain Relieving Factors  rest    Pain Score  7    Pain Location  Hip    Pain  Orientation  Left    Pain Descriptors / Indicators  Sharp;Nagging         [facility] PT Assessment - 03/16/20 0001      Assessment   Medical Diagnosis  R chronic sh pain    Referring Provider (PT)  Frankey Shown, MD    Hand Dominance  Right    Next MD Visit  6 months    Prior Therapy  yes      AROM   Overall AROM   Within functional limits for tasks performed    Overall AROM Comments  R GH/scapular AROMs WNLs      Palpation   Palpation comment  TTP along the infraspinatus and rhomoboids along the medial scapular border.                    [place] Adult PT Treatment/Exercise - 03/16/20 0001      Shoulder Exercises: Seated   Retraction  10 reps   holding 5 sec ea.   External Rotation  10 reps;Right;Theraband    Theraband Level (Shoulder External Rotation)  Level 2 (Red)      Shoulder Exercises: Stretch  Other Shoulder Stretches  rhomboid stretch 2 x 30     Other Shoulder Stretches  crossboyd stretch 2 x 30 sec R only      Manual Therapy   Manual Therapy  Soft tissue mobilization    Manual therapy comments  skilled palpation and monitoring of pt throughout TPDN    Soft tissue mobilization  IASTM along the infraspinatus and rhomboids             PT Education - 03/16/20 1102    Education Details  updated HEP today.    Person(s) Educated  Patient    Methods  Explanation;Verbal cues;Handout    Comprehension  Verbalized understanding;Verbal cues required       PT Short Term Goals - 03/16/20 1037      PT SHORT TERM GOAL #1   Title  Pt will be Ind in an initial HEP to address R shoulder and function.    Status  On-going      PT SHORT TERM GOAL #2   Title  -      PT SHORT TERM GOAL #3   Title  -        PT Long Term Goals - 03/16/20 1035      PT LONG TERM GOAL #1   Title  Pt will be ind in a final HEP to address R shoulder pain and function.    Baseline  not established    Time  8    Period  Weeks    Status  On-going    Target Date  04/15/20       PT LONG TERM GOAL #2   Title  Pt will report a R shoulder pain range of 1-6/10 with functional movement and sleep vs 3-10/10.    Baseline  3-10/10    Time  8    Period  Weeks    Status  New    Target Date  04/15/20      PT LONG TERM GOAL #3   Title  R shoulder trigger points of the medial scapular border and upper trapezius wil.l demonstrate decreased sensitivity and reactiveness to palpation    Baseline  Reactive with significant pain response    Time  8    Period  Weeks    Status  On-going    Target Date  04/15/20            Plan - 03/16/20 1051    Clinical Impression Statement  Patient presents with chronic pain in the right posterior shoulder. educated and consent was provided for TPDN focusing on infraspinatus and rhomboids followed with IASTM techniques. worked on stretching the rhomboids and strengthening of the rotator cuff. She noted decreased pain end of session. she would benefit from continued physical therapy to decrease R shoulder pain, increase strength and maximize function by addressing the deficits listed.    Personal Factors and Comorbidities  Time since onset of injury/illness/exacerbation;Past/Current Experience;Comorbidity 1;Comorbidity 2    Examination-Activity Limitations  Carry;Dressing;Sleep;Sit;Self Feeding;Transfers;Stand;Lift;Bathing;Bed Mobility    Rehab Potential  Good    PT Frequency  1x / week    PT Duration  8 weeks   MCD 1 x a week for 3 weeks.   PT Treatment/Interventions  Ultrasound;Traction;Moist Heat;Therapeutic activities;Therapeutic exercise;Neuromuscular re-education;Manual techniques;Dry needling;Joint Manipulations;Vasopneumatic Device;Patient/family education    PT Next Visit Plan  Assess education for positioning and support of th R UE in sitting and sleeping. Modalities and manual therapy to address pain and trigger points.    PT Home  Exercise Plan  [id number] - scapular retraction, Shoulder ER, rhomboid stretch, cross body stretch     Consulted and Agree with Plan of Care  Patient       Patient will benefit from skilled therapeutic intervention in order to improve the following deficits and impairments:  Decreased activity tolerance, Increased muscle spasms, Postural dysfunction, Improper body mechanics, Pain  Visit Diagnosis: Chronic right shoulder pain     Problem List Patient Active Problem List   Diagnosis Date Noted  . Bipolar 1 disorder ([place]) 04/03/2019  . Anxiety 04/03/2019  . Hypertension 08/26/2018  . Diabetes ([place]) 01/01/2018  . Asthma 09/06/2017  . Depression 06/08/2017  . Back pain 06/08/2017  . Shoulder pain 06/08/2017  . Insomnia 06/08/2017  . IBS (irritable bowel syndrome) 03/30/2017  . GERD (gastroesophageal reflux disease) 03/30/2017  . Constipation 01/23/2017  . Nausea & vomiting 01/21/2017  . Transaminitis 01/21/2017  . 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/21/2017  . Injury of right hand 09/14/2015  . Rash 09/14/2015    [place]arr Lake 03/16/2020, 11:14 AM  [place], [place], [address] Phone: [phone]   Fax:  [phone]  Name: Bird Tailor MRN: [id number] Date of Birth: 1955-12-13

## 2020-03-16 NOTE — Therapy (Signed)
[place], [place], [address] Phone: [phone]   Fax:  [phone]  Physical Therapy Treatment / Re-certification  Patient Details  Name: Paige Andrews MRN: [id number] Date of Birth: [birth year] Referring Provider (PT): Frankey Shown, MD   Encounter Date: 03/16/2020  PT End of Session - 03/16/20 1016    Visit Number  2    Number of Visits  13    Date for PT Re-Evaluation  04/27/20    Authorization Type  MCD    Authorization Time Period  02/18/20-04/16/20    Authorization - Visit Number  1    Authorization - Number of Visits  3    PT Start Time  1016    PT Stop Time  1100    PT Time Calculation (min)  44 min    Activity Tolerance  Patient tolerated treatment well    Behavior During Therapy  [facility] for tasks assessed/performed       Past Medical History:  Diagnosis Date  . Anxiety   . Bipolar 1 disorder ([place])   . Celiac disease   . Depression   . Hypertension   . IBS (irritable bowel syndrome)     Past Surgical History:  Procedure Laterality Date  . carpel tunnel Right   . CESAREAN SECTION    . SHOULDER SURGERY Right     There were no vitals filed for this visit.  Subjective Assessment - 03/16/20 1016    Subjective  "My shoulder was hurting really bad last night, but it's calmed down." Patient has total ROM, but aches and sharp pain in the shoulder are the biggest concern. She doesn't feel as if the surgeries have been helpful.    Pertinent History  stable L2 compression fracture, anxiety, bipolar depression, DM, neuropathy of feet, shoulder surgery 1987/88 arthroscopic,    Pain Score  5     Pain Location  Shoulder    Pain Orientation  Right;Posterior    Pain Descriptors / Indicators  Aching;Sharp    Pain Radiating Towards  neck and jaw    Pain Onset  More than a month ago    Aggravating Factors   reaching overhead, sleeping    Pain Relieving Factors  rest    Pain Score  7    Pain Location  Hip     Pain Orientation  Left    Pain Descriptors / Indicators  Sharp;Nagging         [facility] PT Assessment - 03/16/20 0001      Assessment   Medical Diagnosis  R chronic sh pain    Referring Provider (PT)  Frankey Shown, MD    Hand Dominance  Right    Next MD Visit  6 months    Prior Therapy  yes      AROM   Overall AROM   Within functional limits for tasks performed    Overall AROM Comments  R GH/scapular AROMs WNLs      Palpation   Palpation comment  TTP along the infraspinatus and rhomoboids along the medial scapular border.                    [facility] Adult PT Treatment/Exercise - 03/16/20 0001      Shoulder Exercises: Seated   Retraction  10 reps   holding 5 sec ea.   External Rotation  10 reps;Right;Theraband    Theraband Level (Shoulder External Rotation)  Level 2 (Red)      Shoulder  Exercises: Stretch   Other Shoulder Stretches  rhomboid stretch 2 x 30     Other Shoulder Stretches  crossboyd stretch 2 x 30 sec R only      Manual Therapy   Manual Therapy  Soft tissue mobilization    Manual therapy comments  skilled palpation and monitoring of pt throughout TPDN    Soft tissue mobilization  IASTM along the infraspinatus and rhomboids       Trigger Point Dry Needling - 03/16/20 0001    Consent Given?  Yes    Education Handout Provided  Yes    Muscles Treated Upper Quadrant  Infraspinatus;Rhomboids    Rhomboids Response  Twitch response elicited;Palpable increased muscle length   R only   Infraspinatus Response  Twitch response elicited;Palpable increased muscle length   R only          PT Education - 03/16/20 1102    Education Details  updated HEP today.    Person(s) Educated  Patient    Methods  Explanation;Verbal cues;Handout    Comprehension  Verbalized understanding;Verbal cues required       PT Short Term Goals - 03/16/20 1037      PT SHORT TERM GOAL #1   Title  Pt will be Ind in an initial HEP to address R shoulder and function.     Status  On-going      PT SHORT TERM GOAL #2   Title  -      PT SHORT TERM GOAL #3   Title  -        PT Long Term Goals - 03/16/20 1035      PT LONG TERM GOAL #1   Title  Pt will be ind in a final HEP to address R shoulder pain and function.    Baseline  not established    Time  8    Period  Weeks    Status  On-going    Target Date  04/15/20      PT LONG TERM GOAL #2   Title  Pt will report a R shoulder pain range of 1-6/10 with functional movement and sleep vs 3-10/10.    Baseline  3-10/10    Time  8    Period  Weeks    Status  New    Target Date  04/15/20      PT LONG TERM GOAL #3   Title  R shoulder trigger points of the medial scapular border and upper trapezius wil.l demonstrate decreased sensitivity and reactiveness to palpation    Baseline  Reactive with significant pain response    Time  8    Period  Weeks    Status  On-going    Target Date  04/15/20            Plan - 03/16/20 1051    Clinical Impression Statement  Patient presents with chronic pain in the right posterior shoulder. educated and consent was provided for TPDN focusing on infraspinatus and rhomboids followed with IASTM techniques. worked on stretching the rhomboids and strengthening of the rotator cuff. She noted decreased pain end of session. she would benefit from continued physical therapy to decrease R shoulder pain, increase strength and maximize function by addressing the deficits listed.    Personal Factors and Comorbidities  Time since onset of injury/illness/exacerbation;Past/Current Experience;Comorbidity 1;Comorbidity 2    Examination-Activity Limitations  Carry;Dressing;Sleep;Sit;Self Feeding;Transfers;Stand;Lift;Bathing;Bed Mobility    Rehab Potential  Good    PT Frequency  1x / week  PT Duration  8 weeks   MCD 1 x a week for 3 weeks.   PT Treatment/Interventions  Ultrasound;Traction;Moist Heat;Therapeutic activities;Therapeutic exercise;Neuromuscular re-education;Manual  techniques;Dry needling;Joint Manipulations;Vasopneumatic Device;Patient/family education    PT Next Visit Plan  Assess education for positioning and support of th R UE in sitting and sleeping. Modalities and manual therapy to address pain and trigger points.    PT Home Exercise Plan  [id number] - scapular retraction, Shoulder ER, rhomboid stretch, cross body stretch    Consulted and Agree with Plan of Care  Patient       Patient will benefit from skilled therapeutic intervention in order to improve the following deficits and impairments:  Decreased activity tolerance, Increased muscle spasms, Postural dysfunction, Improper body mechanics, Pain  Visit Diagnosis: Chronic right shoulder pain     Problem List Patient Active Problem List   Diagnosis Date Noted  . Bipolar 1 disorder ([place]) 04/03/2019  . Anxiety 04/03/2019  . Hypertension 08/26/2018  . Diabetes ([place]) 01/01/2018  . Asthma 09/06/2017  . Depression 06/08/2017  . Back pain 06/08/2017  . Shoulder pain 06/08/2017  . Insomnia 06/08/2017  . IBS (irritable bowel syndrome) 03/30/2017  . GERD (gastroesophageal reflux disease) 03/30/2017  . Constipation 01/23/2017  . Nausea & vomiting 01/21/2017  . Transaminitis 01/21/2017  . 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/21/2017  . Injury of right hand 09/14/2015  . Rash 09/14/2015    Starr Lake PT, DPT, LAT, ATC  03/16/20  11:17 AM      [facility] [address] [place], [place], [address] Phone: [phone]   Fax:  [phone]  Name: Ulah Olmo MRN: [id number] Date of Birth: [birth year]

## 2020-03-23 ENCOUNTER — Ambulatory Visit: Payer: Medicaid Other | Admitting: Physical Therapy

## 2020-03-23 ENCOUNTER — Other Ambulatory Visit: Payer: Self-pay

## 2020-03-23 DIAGNOSIS — M25651 Stiffness of right hip, not elsewhere classified: Secondary | ICD-10-CM

## 2020-03-23 DIAGNOSIS — M25551 Pain in right hip: Secondary | ICD-10-CM

## 2020-03-23 DIAGNOSIS — G8929 Other chronic pain: Secondary | ICD-10-CM

## 2020-03-23 NOTE — Therapy (Signed)
[place], [place], [address] Phone: [phone]   Fax:  [phone]  Physical Therapy Treatment  Patient Details  Name: Paige Andrews MRN: [id number] Date of Birth: 1956/06/02 Referring Provider (PT): Frankey Shown, MD   Encounter Date: 03/23/2020  PT End of Session - 03/23/20 1143    Visit Number  3    Number of Visits  13    Date for PT Re-Evaluation  04/27/20    Authorization Type  MCD    Authorization Time Period  --    PT Start Time  1143    PT Stop Time  1222    PT Time Calculation (min)  39 min    Activity Tolerance  Patient tolerated treatment well       Past Medical History:  Diagnosis Date  . Anxiety   . Bipolar 1 disorder ([place])   . Celiac disease   . Depression   . Hypertension   . IBS (irritable bowel syndrome)     Past Surgical History:  Procedure Laterality Date  . carpel tunnel Right   . CESAREAN SECTION    . SHOULDER SURGERY Right     There were no vitals filed for this visit.      [facility] PT Assessment - 03/23/20 0001      Assessment   Medical Diagnosis  R chronic sh pain    Referring Provider (PT)  Frankey Shown, MD                   [facility] Adult PT Treatment/Exercise - 03/23/20 0001      Self-Care   Self-Care  Other Self-Care Comments    Other Self-Care Comments   how to perform trigger point release with tools and tennis ball      Exercises   Exercises  Shoulder      Shoulder Exercises: Supine   Protraction  --   3 sets of 20 with rhythmic stablization     Shoulder Exercises: Seated   Other Seated Exercises  seated lower trap stretching 2 x 10 with elbows propped on bolster with red theraband      Shoulder Exercises: Standing   External Rotation  Theraband;Strengthening;Right;12 reps    Theraband Level (Shoulder External Rotation)  Level 2 (Red)    Internal Rotation  Strengthening;Right;12 reps;Theraband    Theraband Level (Shoulder Internal  Rotation)  Level 2 (Red)      Shoulder Exercises: Stretch   Other Shoulder Stretches  levator scapuale stretch 2 x 30sec     Other Shoulder Stretches  rhomboid stretch 2 x 30 sec      Manual Therapy   Manual therapy comments  MTPR along the Rhomboid minor/ major and levator scapuale             PT Education - 03/23/20 1217    Education Details  updated HEp for scapular protraciton, reviewed previous HEP. self trigger point release techniques.    Person(s) Educated  Patient    Methods  Explanation;Verbal cues;Handout    Comprehension  Verbalized understanding;Verbal cues required       PT Short Term Goals - 03/16/20 1037      PT SHORT TERM GOAL #1   Title  Pt will be Ind in an initial HEP to address R shoulder and function.    Status  On-going      PT SHORT TERM GOAL #2   Title  -      PT SHORT TERM  GOAL #3   Title  -        PT Long Term Goals - 03/16/20 1035      PT LONG TERM GOAL #1   Title  Pt will be ind in a final HEP to address R shoulder pain and function.    Baseline  not established    Time  8    Period  Weeks    Status  On-going    Target Date  04/15/20      PT LONG TERM GOAL #2   Title  Pt will report a R shoulder pain range of 1-6/10 with functional movement and sleep vs 3-10/10.    Baseline  3-10/10    Time  8    Period  Weeks    Status  New    Target Date  04/15/20      PT LONG TERM GOAL #3   Title  R shoulder trigger points of the medial scapular border and upper trapezius wil.l demonstrate decreased sensitivity and reactiveness to palpation    Baseline  Reactive with significant pain response    Time  8    Period  Weeks    Status  On-going    Target Date  04/15/20            Plan - 03/23/20 1224    Clinical Impression Statement  pt reports limited consistency due with her HEP due to having family in. Reviewed her HEP and importance of making time for exercises. reviewed trigger point release techniques and how it can be  performed at home. she was able to perform all exercises noting reduced tension in the R shoulder.    PT Treatment/Interventions  Ultrasound;Traction;Moist Heat;Therapeutic activities;Therapeutic exercise;Neuromuscular re-education;Manual techniques;Dry needling;Joint Manipulations;Vasopneumatic Device;Patient/family education    PT Next Visit Plan  Assess education for positioning and support of th R UE in sitting and sleeping. Modalities and manual therapy to address pain and trigger points.    PT Home Exercise Plan  [id number] - scapular retraction, Shoulder ER, rhomboid stretch, cross body stretch, ceiling punch with circles.    Consulted and Agree with Plan of Care  Patient       Patient will benefit from skilled therapeutic intervention in order to improve the following deficits and impairments:  Decreased activity tolerance, Increased muscle spasms, Postural dysfunction, Improper body mechanics, Pain  Visit Diagnosis: Chronic right shoulder pain  Pain in right hip  Stiffness of right hip, not elsewhere classified     Problem List Patient Active Problem List   Diagnosis Date Noted  . Bipolar 1 disorder ([place]) 04/03/2019  . Anxiety 04/03/2019  . Hypertension 08/26/2018  . Diabetes ([place]) 01/01/2018  . Asthma 09/06/2017  . Depression 06/08/2017  . Back pain 06/08/2017  . Shoulder pain 06/08/2017  . Insomnia 06/08/2017  . IBS (irritable bowel syndrome) 03/30/2017  . GERD (gastroesophageal reflux disease) 03/30/2017  . Constipation 01/23/2017  . Nausea & vomiting 01/21/2017  . Transaminitis 01/21/2017  . 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/21/2017  . Injury of right hand 09/14/2015  . Rash 09/14/2015   Starr Lake PT, DPT, LAT, ATC  03/23/20  12:30 PM      [facility] [employer] [address] [place], [place], [address] Phone: [phone]   Fax:  [phone]  Name: Fantashia Shupert MRN: [id number] Date of Birth: Nov 17, 1956

## 2020-03-26 MED FILL — OLANZapine 5 MG TABS: 5 | 30 days supply | Qty: 30 | Fill #0

## 2020-03-26 MED FILL — busPIRone HCL 15 MG TABS: 15 | 30 days supply | Qty: 120 | Fill #0

## 2020-03-29 ENCOUNTER — Other Ambulatory Visit: Payer: Self-pay | Admitting: Family Medicine

## 2020-03-29 DIAGNOSIS — I10 Essential (primary) hypertension: Secondary | ICD-10-CM

## 2020-03-29 MED FILL — BACLOFEN 10 MG TABLET: 10 | 90 days supply | Qty: 270 | Fill #0

## 2020-03-29 MED FILL — GABAPENTIN 300 MG CAPSULE: 300 | 30 days supply | Qty: 60 | Fill #0

## 2020-03-29 MED FILL — BUPROPION HCL XL 150 MG TAB: 150 | 60 days supply | Qty: 60 | Fill #0

## 2020-03-30 ENCOUNTER — Ambulatory Visit: Payer: Medicaid Other | Admitting: Physical Therapy

## 2020-03-30 ENCOUNTER — Other Ambulatory Visit: Payer: Self-pay

## 2020-03-30 DIAGNOSIS — M25511 Pain in right shoulder: Secondary | ICD-10-CM | POA: Diagnosis not present

## 2020-03-30 DIAGNOSIS — G8929 Other chronic pain: Secondary | ICD-10-CM

## 2020-03-30 MED FILL — LOSARTAN-HCTZ 50-12.5 MG TA: 50-12.5 | 90 days supply | Qty: 90 | Fill #0

## 2020-03-30 NOTE — Therapy (Addendum)
[place], [place], [address] Phone: [phone]   Fax:  [phone]  Physical Therapy Treatment  Patient Details  Name: Paige Andrews MRN: [id number] Date of Birth: 1956/03/05 Referring Provider (PT): Frankey Shown, MD   Encounter Date: 03/30/2020  PT End of Session - 03/30/20 1154    Visit Number  4    Number of Visits  13    Date for PT Re-Evaluation  04/27/20    Authorization Type  MCD    Authorization Time Period  03/24/2020 - 05/18/2020    Authorization - Visit Number  1    Authorization - Number of Visits  8    PT Start Time  8250    PT Stop Time  1238    PT Time Calculation (min)  50 min    Activity Tolerance  Patient tolerated treatment well    Behavior During Therapy  [facility] for tasks assessed/performed       Past Medical History:  Diagnosis Date  . Anxiety   . Bipolar 1 disorder ([place])   . Celiac disease   . Depression   . Hypertension   . IBS (irritable bowel syndrome)     Past Surgical History:  Procedure Laterality Date  . carpel tunnel Right   . CESAREAN SECTION    . SHOULDER SURGERY Right     There were no vitals filed for this visit.  Subjective Assessment - 03/30/20 1151    Patient Stated Goals  To hav w less shoulder pain and to sleep better.    Currently in Pain?  Yes    Pain Score  6     Pain Location  Shoulder    Pain Orientation  Right    Pain Type  Chronic pain    Pain Onset  More than a month ago    Pain Frequency  Intermittent         [facility] PT Assessment - 03/30/20 0001      Assessment   Medical Diagnosis  R chronic sh pain    Referring Provider (PT)  Frankey Shown, MD                   [facility] Adult PT Treatment/Exercise - 03/30/20 0001      Shoulder Exercises: Supine   Other Supine Exercises  thoracic extension over bolster with hands behind head and elbows adducted 2 x 10   1 set with bolster beneath scapulae, 1 set bolster at scapul     Shoulder Exercises: Sidelying   Other Sidelying Exercises  book opening 2 x 10    cues for positiong and proper form      Shoulder Exercises: Stretch   Other Shoulder Stretches  rhomboid stretch 2 x 30 sec      Modalities   Modalities  Moist Heat      Moist Heat Therapy   Number Minutes Moist Heat  10 Minutes    Moist Heat Location  Shoulder;Other (comment)   in supine     Manual Therapy   Manual Therapy  Joint mobilization    Manual therapy comments  skilled palpation and monitoring of pt throughout TPDN    Joint Mobilization  T1-T6 PA central / R unilateral mobs grade III    Soft tissue mobilization          Trigger Point Dry Needling - 03/30/20 0001    Consent Given?  Yes    Education Handout Provided  Previously provided  Muscles Treated Head and Neck  Levator scapulae;Upper trapezius    Upper Trapezius Response  Twitch reponse elicited;Palpable increased muscle length   R   Levator Scapulae Response  Twitch response elicited;Palpable increased muscle length   R   Rhomboids Response  Twitch response elicited;Palpable increased muscle length   R            PT Short Term Goals - 03/16/20 1037      PT SHORT TERM GOAL #1   Title  Pt will be Ind in an initial HEP to address R shoulder and function.    Status  On-going      PT SHORT TERM GOAL #2   Title  -      PT SHORT TERM GOAL #3   Title  -        PT Long Term Goals - 03/16/20 1035      PT LONG TERM GOAL #1   Title  Pt will be ind in a final HEP to address R shoulder pain and function.    Baseline  not established    Time  8    Period  Weeks    Status  On-going    Target Date  04/15/20      PT LONG TERM GOAL #2   Title  Pt will report a R shoulder pain range of 1-6/10 with functional movement and sleep vs 3-10/10.    Baseline  3-10/10    Time  8    Period  Weeks    Status  New    Target Date  04/15/20      PT LONG TERM GOAL #3   Title  R shoulder trigger points of the medial scapular  border and upper trapezius wil.l demonstrate decreased sensitivity and reactiveness to palpation    Baseline  Reactive with significant pain response    Time  8    Period  Weeks    Status  On-going    Target Date  04/15/20            Plan - 03/30/20 1228    Clinical Impression Statement  pt reports consistency with her HEP and despite noting increased pain today continues to report improvement. continued TPDN focusing on the R shoulder rhomboids, levator scapuale and upper trap followed with IASTM techniues and thoracic mobs. continued focus on thoracic mobility which she demosntrates increased soreness over the T4-T6 costovertebral facets and responed well to thoracic mobility exercises. continued MHP end of session to decrease soreness following DN.    PT Treatment/Interventions  Ultrasound;Traction;Moist Heat;Therapeutic activities;Therapeutic exercise;Neuromuscular re-education;Manual techniques;Dry needling;Joint Manipulations;Vasopneumatic Device;Patient/family education    PT Next Visit Plan  Assess education for positioning and support of th R UE in sitting and sleeping. review response to TPDN, thoracic mobility, trial foam roll /towel routine.    PT Home Exercise Plan  [id number] - scapular retraction, Shoulder ER, rhomboid stretch, cross body stretch, ceiling punch with circles.    Consulted and Agree with Plan of Care  Patient       Patient will benefit from skilled therapeutic intervention in order to improve the following deficits and impairments:  Decreased activity tolerance, Increased muscle spasms, Postural dysfunction, Improper body mechanics, Pain  Visit Diagnosis: Chronic right shoulder pain     Problem List Patient Active Problem List   Diagnosis Date Noted  . Bipolar 1 disorder ([facility]) 04/03/2019  . Anxiety 04/03/2019  . Hypertension 08/26/2018  . Diabetes ([facility]) 01/01/2018  . Asthma 09/06/2017  .  Depression 06/08/2017  . Back pain 06/08/2017  . Shoulder  pain 06/08/2017  . Insomnia 06/08/2017  . IBS (irritable bowel syndrome) 03/30/2017  . GERD (gastroesophageal reflux disease) 03/30/2017  . Constipation 01/23/2017  . Nausea & vomiting 01/21/2017  . Transaminitis 01/21/2017  . 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/21/2017  . Injury of right hand 09/14/2015  . Rash 09/14/2015   Lulu Riding PT, DPT, LAT, ATC  03/30/20  12:37 PM      [facility] [address] [place], [place], [address] Phone: [phone]   Fax:  [phone]  Name: Paige Andrews MRN: [id number] Date of Birth: 1956/04/29

## 2020-04-06 ENCOUNTER — Encounter: Payer: Medicaid Other | Admitting: Physical Therapy

## 2020-04-14 ENCOUNTER — Ambulatory Visit: Payer: Medicaid Other | Admitting: Physical Therapy

## 2020-04-21 ENCOUNTER — Other Ambulatory Visit: Payer: Self-pay

## 2020-04-21 ENCOUNTER — Encounter: Payer: Self-pay | Admitting: Physical Therapy

## 2020-04-21 ENCOUNTER — Ambulatory Visit: Payer: Medicaid Other | Attending: Orthopaedic Surgery | Admitting: Physical Therapy

## 2020-04-21 DIAGNOSIS — G8929 Other chronic pain: Secondary | ICD-10-CM | POA: Diagnosis present

## 2020-04-21 DIAGNOSIS — M25651 Stiffness of right hip, not elsewhere classified: Secondary | ICD-10-CM | POA: Insufficient documentation

## 2020-04-21 DIAGNOSIS — M25551 Pain in right hip: Secondary | ICD-10-CM | POA: Insufficient documentation

## 2020-04-21 DIAGNOSIS — M25511 Pain in right shoulder: Secondary | ICD-10-CM | POA: Insufficient documentation

## 2020-04-21 NOTE — Therapy (Signed)
[place], [place], [address] Phone: [phone]   Fax:  [phone]  Physical Therapy Treatment  Patient Details  Name: Paige Andrews MRN: [id number] Date of Birth: Mar 03, 1956 Referring Provider (PT): Frankey Shown, MD   Encounter Date: 04/21/2020  PT End of Session - 04/21/20 1015    Visit Number  5    Number of Visits  13    Date for PT Re-Evaluation  04/27/20    Authorization Type  MCD    Authorization Time Period  03/24/2020 - 05/18/2020    Authorization - Visit Number  2    Authorization - Number of Visits  8    PT Start Time  1115    PT Stop Time  1059    PT Time Calculation (min)  44 min    Activity Tolerance  Patient tolerated treatment well    Behavior During Therapy  [facility] for tasks assessed/performed       Past Medical History:  Diagnosis Date  . Anxiety   . Bipolar 1 disorder ([place])   . Celiac disease   . Depression   . Hypertension   . IBS (irritable bowel syndrome)     Past Surgical History:  Procedure Laterality Date  . carpel tunnel Right   . CESAREAN SECTION    . SHOULDER SURGERY Right     There were no vitals filed for this visit.  Subjective Assessment - 04/21/20 1016    Subjective  "I don't know what happened but I am still having 6/10 pain that doesn't seem to improve"    Patient Stated Goals  To hav w less shoulder pain and to sleep better.    Currently in Pain?  Yes    Pain Score  6     Pain Location  Shoulder    Pain Orientation  Right    Aggravating Factors   reaching overhead, sleeping    Pain Relieving Factors  rest         [facility] PT Assessment - 04/21/20 0001      Assessment   Medical Diagnosis  R chronic sh pain    Referring Provider (PT)  Frankey Shown, MD                   [facility] Adult PT Treatment/Exercise - 04/21/20 0001      Shoulder Exercises: Supine   Other Supine Exercises  supine foam roll routine (using rolled towels) ceiling punches,  horizontal abd/adduction, alternating ceiling punches, X to Y , back stroke 1 x 15 ea.      Shoulder Exercises: ROM/Strengthening   Nustep  L5 x 5 min UE/LE      Shoulder Exercises: Stretch   Other Shoulder Stretches  levator scapulae stretch 2 x 30sec , upper trap stretch R only    Other Shoulder Stretches  rhomboid stretch 2 x 30 sec      Manual Therapy   Manual Therapy  Manual Traction    Manual therapy comments  skilled palpation and monitoring of pt throughout TPDN    Joint Mobilization  T1-T6 PA central / R unilateral mobs grade III    Soft tissue mobilization  IASTM along the R upper trap/ levator scapuale and rhomboids    Manual Traction  cervical traction x 5 min       Trigger Point Dry Needling - 04/21/20 0001    Consent Given?  Yes    Education Handout Provided  Previously provided  Upper Trapezius Response  Twitch reponse elicited;Palpable increased muscle length   R side only   Levator Scapulae Response  Twitch response elicited;Palpable increased muscle length   R side only   Rhomboids Response  Twitch response elicited;Palpable increased muscle length           PT Education - 04/21/20 1044    Education Details  Discussed with pt if no progress is made in the next few visits we may need to refer back to her MD for further assessment.    Person(s) Educated  Patient    Methods  Explanation;Verbal cues    Comprehension  Verbalized understanding;Verbal cues required       PT Short Term Goals - 03/16/20 1037      PT SHORT TERM GOAL #1   Title  Pt will be Ind in an initial HEP to address R shoulder and function.    Status  On-going      PT SHORT TERM GOAL #2   Title  -      PT SHORT TERM GOAL #3   Title  -        PT Long Term Goals - 03/16/20 1035      PT LONG TERM GOAL #1   Title  Pt will be ind in a final HEP to address R shoulder pain and function.    Baseline  not established    Time  8    Period  Weeks    Status  On-going    Target Date   04/15/20      PT LONG TERM GOAL #2   Title  Pt will report a R shoulder pain range of 1-6/10 with functional movement and sleep vs 3-10/10.    Baseline  3-10/10    Time  8    Period  Weeks    Status  New    Target Date  04/15/20      PT LONG TERM GOAL #3   Title  R shoulder trigger points of the medial scapular border and upper trapezius wil.l demonstrate decreased sensitivity and reactiveness to palpation    Baseline  Reactive with significant pain response    Time  8    Period  Weeks    Status  On-going    Target Date  04/15/20            Plan - 04/21/20 1051    Clinical Impression Statement  pt reports contineud pain rated at 6/10 today and notes limited improvement since she has been coming to PT. continued TPDN focusing on R upper trap/ levator scapuale and rhomboids followed with cervicothoracic mobs. she responded well to manual cerivcal traction and may benefit from mechanical traction next session.  continued focusing scapulothoracic region which she responded well to noting decreased tension following session.    PT Next Visit Plan  Assess education for positioning and support of th R UE in sitting and sleeping. review response to TPDN, thoracic mobility, trial foam roll /towel routine. try cervical traction    PT Home Exercise Plan  [id number] - scapular retraction, Shoulder ER, rhomboid stretch, cross body stretch, ceiling punch with circles.    Consulted and Agree with Plan of Care  Patient       Patient will benefit from skilled therapeutic intervention in order to improve the following deficits and impairments:  Decreased activity tolerance, Increased muscle spasms, Postural dysfunction, Improper body mechanics, Pain  Visit Diagnosis: Chronic right shoulder pain     Problem  List Patient Active Problem List   Diagnosis Date Noted  . Bipolar 1 disorder ([place]) 04/03/2019  . Anxiety 04/03/2019  . Hypertension 08/26/2018  . Diabetes ([place]) 01/01/2018  . Asthma  09/06/2017  . Depression 06/08/2017  . Back pain 06/08/2017  . Shoulder pain 06/08/2017  . Insomnia 06/08/2017  . IBS (irritable bowel syndrome) 03/30/2017  . GERD (gastroesophageal reflux disease) 03/30/2017  . Constipation 01/23/2017  . Nausea & vomiting 01/21/2017  . Transaminitis 01/21/2017  . 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/21/2017  . Injury of right hand 09/14/2015  . Rash 09/14/2015    Starr Lake PT, DPT, LAT, ATC  04/21/20  11:00 AM      [facility] [address] [place], [place], [address] Phone: [phone]   Fax:  [phone]  Name: Paige Andrews MRN: [id number] Date of Birth: 11-16-1956

## 2020-04-22 ENCOUNTER — Other Ambulatory Visit: Payer: Self-pay | Admitting: Family Medicine

## 2020-04-22 DIAGNOSIS — G4709 Other insomnia: Secondary | ICD-10-CM

## 2020-04-28 ENCOUNTER — Ambulatory Visit: Payer: Medicaid Other | Admitting: Physical Therapy

## 2020-05-05 ENCOUNTER — Ambulatory Visit: Payer: Medicaid Other | Admitting: Physical Therapy

## 2020-05-05 ENCOUNTER — Encounter: Payer: Self-pay | Admitting: Physical Therapy

## 2020-05-05 ENCOUNTER — Other Ambulatory Visit: Payer: Self-pay

## 2020-05-05 DIAGNOSIS — M25651 Stiffness of right hip, not elsewhere classified: Secondary | ICD-10-CM

## 2020-05-05 DIAGNOSIS — G8929 Other chronic pain: Secondary | ICD-10-CM

## 2020-05-05 DIAGNOSIS — M25551 Pain in right hip: Secondary | ICD-10-CM

## 2020-05-05 DIAGNOSIS — M25511 Pain in right shoulder: Secondary | ICD-10-CM | POA: Diagnosis not present

## 2020-05-05 NOTE — Therapy (Signed)
[place], [place], [address] Phone: [phone]   Fax:  [phone]  Physical Therapy Treatment / Discharge  Patient Details  Name: Paige Andrews MRN: [id number] Date of Birth: 01-Aug-1956 Referring Provider (PT): Frankey Shown, MD   Encounter Date: 05/05/2020  PT End of Session - 05/05/20 1013    Visit Number  6    Number of Visits  13    Date for PT Re-Evaluation  05/05/20    Authorization Type  MCD    Authorization Time Period  03/24/2020 - 05/18/2020    Authorization - Visit Number  3    Authorization - Number of Visits  8    PT Start Time  6606    PT Stop Time  1040    PT Time Calculation (min)  26 min    Activity Tolerance  Patient tolerated treatment well    Behavior During Therapy  [facility] for tasks assessed/performed       Past Medical History:  Diagnosis Date  . Anxiety   . Bipolar 1 disorder ([place])   . Celiac disease   . Depression   . Hypertension   . IBS (irritable bowel syndrome)     Past Surgical History:  Procedure Laterality Date  . carpel tunnel Right   . CESAREAN SECTION    . SHOULDER SURGERY Right     There were no vitals filed for this visit.  Subjective Assessment - 05/05/20 1014    Subjective  "I wasn't able to make it in due to having issues with anxiety. I am still having achineness in the back of the shoulder.    Patient Stated Goals  To hav w less shoulder pain and to sleep better.    Currently in Pain?  Yes    Pain Score  7     Aggravating Factors   unsure of specific activity    Pain Relieving Factors  heat, laying down, doing the exercises.         [facility] PT Assessment - 05/05/20 0001      Assessment   Medical Diagnosis  R chronic sh pain    Referring Provider (PT)  Frankey Shown, MD    Next MD Visit  make one PRN      Precautions   Precautions  None                    [facility] Adult PT Treatment/Exercise - 05/05/20 0001      Manual Therapy   Manual therapy comments  MTPR along the Rhomboid minor/ major and levator scapuale             PT Education - 05/05/20 1037    Education Details  reviewed HEP and dicsussed importance of consistency and how to progress strengthening with increased reps/ sets and resistance.    Person(s) Educated  Patient    Methods  Explanation;Verbal cues    Comprehension  Verbalized understanding;Verbal cues required       PT Short Term Goals - 05/05/20 1019      PT SHORT TERM GOAL #1   Title  Pt will be Ind in an initial HEP to address R shoulder and function.    Period  Weeks    Status  Achieved        PT Long Term Goals - 05/05/20 1019      PT LONG TERM GOAL #1   Title  Pt will be ind in a final HEP  to address R shoulder pain and function.    Period  Weeks    Status  Achieved      PT LONG TERM GOAL #2   Title  Pt will report a R shoulder pain range of 1-6/10 with functional movement and sleep vs 3-10/10.    Baseline  max pain is a 7/10. but can fluctuate higher    Period  Weeks    Status  Not Met      PT LONG TERM GOAL #3   Title  R shoulder trigger points of the medial scapular border and upper trapezius wil.l demonstrate decreased sensitivity and reactiveness to palpation    Period  Weeks    Status  Not Met            Plan - 05/05/20 1039    Clinical Impression Statement  Paige Andrews has made limited progress with physical therapy reporting continued pain max pain around 7-8/10 but is fairly consistent at 6/10. She would make progress in the session that would last for 1-2 days per pt report but would return to her next appointment reporting continued pain with no carryover benefits. She met only STG and LTG #1, based on continued pain and limited functional progression pt would benefit from returning to MD for further assesment and will be discharged from PT today.    PT Treatment/Interventions  Ultrasound;Traction;Moist Heat;Therapeutic activities;Therapeutic  exercise;Neuromuscular re-education;Manual techniques;Dry needling;Joint Manipulations;Vasopneumatic Device;Patient/family education    PT Next Visit Plan  D/C    PT Home Exercise Plan  [id number] - scapular retraction, Shoulder ER, rhomboid stretch, cross body stretch, ceiling punch with circles.    Consulted and Agree with Plan of Care  Patient       Patient will benefit from skilled therapeutic intervention in order to improve the following deficits and impairments:  Decreased activity tolerance, Increased muscle spasms, Postural dysfunction, Improper body mechanics, Pain  Visit Diagnosis: Chronic right shoulder pain  Pain in right hip  Stiffness of right hip, not elsewhere classified     Problem List Patient Active Problem List   Diagnosis Date Noted  . Bipolar 1 disorder ([place]) 04/03/2019  . Anxiety 04/03/2019  . Hypertension 08/26/2018  . Diabetes ([place]) 01/01/2018  . Asthma 09/06/2017  . Depression 06/08/2017  . Back pain 06/08/2017  . Shoulder pain 06/08/2017  . Insomnia 06/08/2017  . IBS (irritable bowel syndrome) 03/30/2017  . GERD (gastroesophageal reflux disease) 03/30/2017  . Constipation 01/23/2017  . Nausea & vomiting 01/21/2017  . Transaminitis 01/21/2017  . 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/21/2017  . Injury of right hand 09/14/2015  . Rash 09/14/2015    [place] 05/05/2020, 10:42 AM  [facility] [address] [place], [place], [address] Phone: [phone]   Fax:  [phone]  Name: Paige Andrews MRN: [id number] Date of Birth: 01-02-1956     PHYSICAL THERAPY DISCHARGE SUMMARY  Visits from Start of Care: 6  Current functional level related to goals / functional outcomes: See goals,    Remaining deficits: Continued thoracic pain, muscle tension/ spasm.    Education / Equipment: HEP, theraband, posture, lifting mechanics, muscle anatomy  Plan: Patient agrees to discharge.  Patient goals  were not met. Patient is being discharged due to lack of progress.  ?????        Aydian Dimmick PT, DPT, LAT, ATC  05/05/20  10:43 AM

## 2020-05-27 ENCOUNTER — Other Ambulatory Visit: Payer: Self-pay | Admitting: Family Medicine

## 2020-05-27 ENCOUNTER — Encounter (HOSPITAL_COMMUNITY): Payer: Self-pay

## 2020-05-27 ENCOUNTER — Emergency Department (HOSPITAL_COMMUNITY)
Admission: EM | Admit: 2020-05-27 | Discharge: 2020-05-28 | Disposition: A | Discharge status: Home / Self Care | Payer: Medicaid Other | Attending: Emergency Medicine | Admitting: Emergency Medicine

## 2020-05-27 ENCOUNTER — Other Ambulatory Visit: Payer: Self-pay

## 2020-05-27 ENCOUNTER — Emergency Department (HOSPITAL_COMMUNITY): Payer: Medicaid Other

## 2020-05-27 ENCOUNTER — Ambulatory Visit (HOSPITAL_COMMUNITY)
Admission: EM | Admit: 2020-05-27 | Discharge: 2020-05-27 | Disposition: A | Discharge status: Home / Self Care | Payer: Medicaid Other | Attending: Urgent Care | Admitting: Urgent Care

## 2020-05-27 DIAGNOSIS — Z7984 Long term (current) use of oral hypoglycemic drugs: Secondary | ICD-10-CM | POA: Diagnosis not present

## 2020-05-27 DIAGNOSIS — I159 Secondary hypertension, unspecified: Secondary | ICD-10-CM

## 2020-05-27 DIAGNOSIS — R112 Nausea with vomiting, unspecified: Secondary | ICD-10-CM | POA: Diagnosis present

## 2020-05-27 DIAGNOSIS — J45909 Unspecified asthma, uncomplicated: Secondary | ICD-10-CM | POA: Diagnosis not present

## 2020-05-27 DIAGNOSIS — R1011 Right upper quadrant pain: Secondary | ICD-10-CM | POA: Diagnosis not present

## 2020-05-27 DIAGNOSIS — E1165 Type 2 diabetes mellitus with hyperglycemia: Secondary | ICD-10-CM

## 2020-05-27 DIAGNOSIS — Z79899 Other long term (current) drug therapy: Secondary | ICD-10-CM | POA: Diagnosis not present

## 2020-05-27 DIAGNOSIS — R197 Diarrhea, unspecified: Secondary | ICD-10-CM

## 2020-05-27 DIAGNOSIS — I10 Essential (primary) hypertension: Secondary | ICD-10-CM | POA: Insufficient documentation

## 2020-05-27 DIAGNOSIS — E119 Type 2 diabetes mellitus without complications: Secondary | ICD-10-CM | POA: Insufficient documentation

## 2020-05-27 DIAGNOSIS — G4709 Other insomnia: Secondary | ICD-10-CM

## 2020-05-27 DIAGNOSIS — R109 Unspecified abdominal pain: Secondary | ICD-10-CM

## 2020-05-27 DIAGNOSIS — R1110 Vomiting, unspecified: Secondary | ICD-10-CM | POA: Diagnosis not present

## 2020-05-27 DIAGNOSIS — R110 Nausea: Secondary | ICD-10-CM

## 2020-05-27 DIAGNOSIS — R1010 Upper abdominal pain, unspecified: Secondary | ICD-10-CM

## 2020-05-27 LAB — COMPREHENSIVE METABOLIC PANEL
ALT: 66 U/L — ABNORMAL HIGH (ref 0–44)
AST: 39 U/L (ref 15–41)
Albumin: 4.6 g/dL (ref 3.5–5.0)
Alkaline Phosphatase: 76 U/L (ref 38–126)
Anion gap: 14 (ref 5–15)
BUN: 9 mg/dL (ref 8–23)
CO2: 21 mmol/L — ABNORMAL LOW (ref 22–32)
Calcium: 10.1 mg/dL (ref 8.9–10.3)
Chloride: 103 mmol/L (ref 98–111)
Creatinine, Ser: 0.86 mg/dL (ref 0.44–1.00)
GFR calc Af Amer: >60 mL/min (ref >60)
GFR calc non Af Amer: >60 mL/min (ref >60)
Glucose, Bld: 216 mg/dL — ABNORMAL HIGH (ref 70–99)
Potassium: 3.5 mmol/L (ref 3.5–5.1)
Sodium: 138 mmol/L (ref 135–145)
Total Bilirubin: 1.1 mg/dL (ref 0.3–1.2)
Total Protein: 7.4 g/dL (ref 6.5–8.1)

## 2020-05-27 LAB — CBC
HCT: 40.5 % (ref 36.0–46.0)
Hemoglobin: 14.4 g/dL (ref 12.0–15.0)
MCH: 31.2 pg (ref 26.0–34.0)
MCHC: 35.6 g/dL (ref 30.0–36.0)
MCV: 87.9 fL (ref 80.0–100.0)
Platelets: 224 10*3/uL (ref 150–400)
RBC: 4.61 MIL/uL (ref 3.87–5.11)
RDW: 12.2 % (ref 11.5–15.5)
WBC: 9.2 10*3/uL (ref 4.0–10.5)
nRBC: 0 % (ref 0.0–0.2)

## 2020-05-27 LAB — URINALYSIS, ROUTINE W REFLEX MICROSCOPIC
Bacteria, UA: NONE SEEN
Bilirubin Urine: NEGATIVE
Glucose, UA: 50 mg/dL — AB
Hgb urine dipstick: NEGATIVE
Ketones, ur: 5 mg/dL — AB
Leukocytes,Ua: NEGATIVE
Nitrite: NEGATIVE
Protein, ur: 100 mg/dL — AB
Specific Gravity, Urine: 1.016 (ref 1.005–1.030)
pH: 6 (ref 5.0–8.0)

## 2020-05-27 LAB — CBG MONITORING, ED: Glucose-Capillary: 218 mg/dL — ABNORMAL HIGH (ref 70–99)

## 2020-05-27 LAB — LIPASE, BLOOD: Lipase: 33 U/L (ref 11–51)

## 2020-05-27 MED ORDER — ONDANSETRON HCL 4 MG/2ML IJ SOLN
INTRAMUSCULAR | Status: AC
  Filled 2020-05-27: qty 2

## 2020-05-27 MED ORDER — ONDANSETRON 8 MG PO TBDP
8.0000 mg | ORAL_TABLET | Freq: Once | ORAL | Status: AC
  Administered 2020-05-28: 8 mg via ORAL
  Filled 2020-05-27: qty 1

## 2020-05-27 MED ORDER — KETOROLAC TROMETHAMINE 60 MG/2ML IM SOLN
30.0000 mg | Freq: Once | INTRAMUSCULAR | Status: AC
  Administered 2020-05-27: 30 mg via INTRAMUSCULAR

## 2020-05-27 MED ORDER — LOPERAMIDE HCL 2 MG PO CAPS: 4.0000 mg | ORAL_CAPSULE | ORAL | Status: DC | PRN

## 2020-05-27 MED ORDER — KETOROLAC TROMETHAMINE 60 MG/2ML IM SOLN: 60.0000 mg | Freq: Once | INTRAMUSCULAR | Status: DC

## 2020-05-27 MED ORDER — ONDANSETRON HCL 4 MG/2ML IJ SOLN
4.0000 mg | Freq: Once | INTRAMUSCULAR | Status: AC
  Administered 2020-05-27: 4 mg via INTRAVENOUS
  Filled 2020-05-27: qty 2

## 2020-05-27 MED ORDER — KETOROLAC TROMETHAMINE 30 MG/ML IJ SOLN
INTRAMUSCULAR | Status: AC
  Filled 2020-05-27: qty 1

## 2020-05-27 MED ORDER — SODIUM CHLORIDE 0.9% FLUSH: 3.0000 mL | Freq: Once | INTRAVENOUS | Status: DC

## 2020-05-27 MED ORDER — ONDANSETRON HCL 8 MG PO TABS
8.0000 mg | ORAL_TABLET | Freq: Three times a day (TID) | ORAL | 0 refills | Status: DC | PRN
Start: 2020-05-27 — End: 2020-10-19

## 2020-05-27 MED ORDER — SODIUM CHLORIDE 0.9 % IV BOLUS
2000.0000 mL | Freq: Once | INTRAVENOUS | Status: AC
  Administered 2020-05-27: 2000 mL via INTRAVENOUS

## 2020-05-27 MED ORDER — LABETALOL HCL 5 MG/ML IV SOLN
20.0000 mg | INTRAVENOUS | Status: DC | PRN
  Administered 2020-05-27: 20 mg via INTRAVENOUS
  Filled 2020-05-27: qty 4

## 2020-05-27 MED ORDER — ONDANSETRON HCL 4 MG/2ML IJ SOLN
4.0000 mg | Freq: Once | INTRAMUSCULAR | Status: AC
  Administered 2020-05-27: 4 mg via INTRAMUSCULAR

## 2020-05-27 MED ORDER — SODIUM CHLORIDE (PF) 0.9 % IJ SOLN
INTRAMUSCULAR | Status: AC
  Filled 2020-05-27: qty 50

## 2020-05-27 MED ORDER — IOHEXOL 300 MG/ML  SOLN
100.0000 mL | Freq: Once | INTRAMUSCULAR | Status: AC | PRN
  Administered 2020-05-27: 100 mL via INTRAVENOUS

## 2020-05-27 NOTE — ED Notes (Signed)
ED Provider at bedside. 

## 2020-05-27 NOTE — Discharge Instructions (Addendum)
Please report to the emergency room now for a CT scan of your abdomen given your severe abdominal pain, tachycardia and history of pancreatitis.

## 2020-05-27 NOTE — ED Triage Notes (Signed)
Pt presents with ongoing nausea, vomiting, and diarrhea X 2 weeks.  Pt has diabetes; pt also had Hx of pancreas issues but never had follow up with gastro

## 2020-05-27 NOTE — ED Triage Notes (Addendum)
Patient c/o right flank pain, N/V/D  x 11 days.  BP-165/113 in triage. Patient states she has not taken her BP meds in 11 days.

## 2020-05-27 NOTE — ED Notes (Signed)
Given ginger ale 

## 2020-05-27 NOTE — ED Notes (Signed)
Patient transported to CT 

## 2020-05-27 NOTE — Discharge Instructions (Signed)
Gradually increase your diet over the next couple of days.  Start taking her medications as soon as possible.  Return here, if needed.

## 2020-05-27 NOTE — ED Provider Notes (Signed)
[facility] DEPT Provider Note   CSN: [id number] Arrival date & time: 05/27/20  2774     History Chief Complaint  Patient presents with  . Abdominal Pain  . Emesis  . Diarrhea    Paige Andrews is a 63 y.o. female.  HPI She presents for evaluation of ongoing vomiting, diarrhea and right upper quadrant abdominal pain.  She saw her PCP today who sent her here for further evaluation, and suggested CT imaging of her abdomen.  Patient states she has had numerous ultrasounds in the past and never had gallbladder disease with similar pain with this.  She has been referred to GI in the past for ongoing problems but never seen 1.  She denies fever, chills, blood in emesis or stool, cough, shortness of breath, exposure to sick contact.  She states that she is trying to take her usual medicines but frequently vomits it.  There are no other known modifying factors.    Past Medical History:  Diagnosis Date  . Anxiety   . Bipolar 1 disorder ([place])   . Celiac disease   . Depression   . Hypertension   . IBS (irritable bowel syndrome)     Patient Active Problem List   Diagnosis Date Noted  . Bipolar 1 disorder ([place]) 04/03/2019  . Anxiety 04/03/2019  . Hypertension 08/26/2018  . Diabetes ([place]) 01/01/2018  . Asthma 09/06/2017  . Depression 06/08/2017  . Back pain 06/08/2017  . Shoulder pain 06/08/2017  . Insomnia 06/08/2017  . IBS (irritable bowel syndrome) 03/30/2017  . GERD (gastroesophageal reflux disease) 03/30/2017  . Constipation 01/23/2017  . Nausea & vomiting 01/21/2017  . Transaminitis 01/21/2017  . 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/21/2017  . Injury of right hand 09/14/2015  . Rash 09/14/2015    Past Surgical History:  Procedure Laterality Date  . carpel tunnel Right   . CESAREAN SECTION    . SHOULDER SURGERY Right      OB History    Gravida  0   Para      Term      Preterm      AB      Living        SAB      TAB      Ectopic       Multiple      Live Births              Family History  Problem Relation Age of Onset  . Dementia Mother   . Breast cancer Mother   . Pancreatitis Father   . Dementia Father     Social History   Tobacco Use  . Smoking status: Never Smoker  . Smokeless tobacco: Never Used  Vaping Use  . Vaping Use: Never used  Substance Use Topics  . 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 Drug use: No    Home Medications Prior to Admission medications   Medication Sig Start Date End Date Taking? Authorizing Provider  ACCU-CHEK SOFTCLIX LANCETS lancets Use as instructed to test sugar once daily 08/26/18   Charlott Rakes, MD  aluminum chloride (DRYSOL) 20 % external solution Apply topically at bedtime. 10/16/18   Charlott Rakes, MD  atorvastatin (LIPITOR) 80 MG tablet Take 1 tablet (80 mg total) by mouth daily. 02/02/20   Charlott Rakes, MD  baclofen (LIORESAL) 10 MG tablet Take 1 tablet (10 mg total) by mouth 3 (  three) times daily. 02/02/20   Charlott Rakes, MD  Blood Glucose Monitoring Suppl (ACCU-CHEK AVIVA PLUS) w/Device KIT Use as instructed to test sugar once daily 07/23/18   Charlott Rakes, MD  busPIRone (BUSPAR) 10 MG tablet Take 10 mg by mouth 3 (three) times daily.    [provider]  butalbital-acetaminophen-caffeine (FIORICET) 50-325-40 MG tablet Take 1 tablet by mouth every 6 (six) hours as needed for headache. 10/22/19 10/21/20  Charlott Rakes, MD  cetirizine (ZYRTEC) 10 MG tablet Take 1 tablet (10 mg total) by mouth daily. 04/03/18   Charlott Rakes, MD  DULoxetine (CYMBALTA) 60 MG capsule Take 1 capsule (60 mg total) by mouth daily. 10/22/19   Charlott Rakes, MD  gabapentin (NEURONTIN) 300 MG capsule Take 1 capsule (300 mg total) by mouth 2 (two) times daily. 02/02/20   Charlott Rakes, MD  glipiZIDE (GLUCOTROL) 5 MG tablet Take 0.5 tablets (2.5 mg total) by mouth 2 (two) times daily before a meal. 02/02/20    Charlott Rakes, MD  glucose blood (ACCU-CHEK AVIVA PLUS) test strip Use as instructed to test sugar once daily 07/23/18   Charlott Rakes, MD  hydrOXYzine (ATARAX/VISTARIL) 25 MG tablet TAKE 1 TABLET BY MOUTH 3 TIMES DAILY AS NEEDED. 10/31/19   Charlott Rakes, MD  Lancets Misc. (ACCU-CHEK FASTCLIX LANCET) KIT Use as directed once daily 07/29/18   Charlott Rakes, MD  losartan-hydrochlorothiazide (HYZAAR) 50-12.5 MG tablet Take 1 tablet by mouth daily. 02/02/20   Charlott Rakes, MD  lurasidone (LATUDA) 40 MG TABS tablet Take 40 mg by mouth daily with breakfast.    [provider]  meloxicam (MOBIC) 7.5 MG tablet Take 1 tablet (7.5 mg total) by mouth daily. 10/22/19   Charlott Rakes, MD  metFORMIN (GLUCOPHAGE) 500 MG tablet Take 2 tablets (1,000 mg total) by mouth 2 (two) times daily with a meal. 02/02/20   Charlott Rakes, MD  ondansetron (ZOFRAN) 8 MG tablet Take 1 tablet (8 mg total) by mouth every 8 (eight) hours as needed for nausea or vomiting. 05/27/20   Daleen Bo, MD  pantoprazole (PROTONIX) 40 MG tablet Take 1 tablet (40 mg total) by mouth daily. 02/02/20   Charlott Rakes, MD  topiramate (TOPAMAX) 50 MG tablet Take 1 tablet (50 mg total) by mouth 2 (two) times daily. 10/22/19   Charlott Rakes, MD  zolpidem (AMBIEN) 5 MG tablet TAKE 1 TABLET BY MOUTH AT BEDTIME AS NEEDED FOR SLEEP 04/25/20   Charlott Rakes, MD  albuterol (PROVENTIL HFA;VENTOLIN HFA) 108 (90 Base) MCG/ACT inhaler Inhale 2 puffs into the lungs every 6 (six) hours as needed for wheezing or shortness of breath. Patient not taking: Reported on 04/03/2019 07/23/18 09/29/19  Charlott Rakes, MD    Allergies    Morphine and related  Review of Systems   Review of Systems  All other systems reviewed and are negative.   Physical Exam Updated Vital Signs BP (!) 145/91   Pulse 89   Temp 98.6 F (37 C) (Oral)   Resp 15   Ht 5' 4"  (1.626 m)   Wt 81.6 kg   LMP 12/11/2014 (Approximate)   SpO2 100%   BMI 30.90  kg/m   Physical Exam Vitals and nursing note reviewed.  Constitutional:      General: She is not in acute distress.    Appearance: She is well-developed. She is not ill-appearing, toxic-appearing or diaphoretic.  HENT:     Head: Normocephalic and atraumatic.     Mouth/Throat:     Mouth: Mucous membranes are  moist.     Pharynx: No oropharyngeal exudate or posterior oropharyngeal erythema.  Eyes:     Conjunctiva/sclera: Conjunctivae normal.     Pupils: Pupils are equal, round, and reactive to light.  Neck:     Trachea: Phonation normal.  Cardiovascular:     Rate and Rhythm: Normal rate and regular rhythm.  Pulmonary:     Effort: Pulmonary effort is normal.     Breath sounds: Normal breath sounds.  Chest:     Chest wall: No tenderness.  Abdominal:     General: There is no distension.     Palpations: Abdomen is soft. There is no mass.     Tenderness: There is abdominal tenderness (Right upper quadrant, mild). There is no guarding.  Musculoskeletal:        General: Normal range of motion.     Cervical back: Normal range of motion and neck supple.  Skin:    General: Skin is warm and dry.  Neurological:     Mental Status: She is alert and oriented to person, place, and time.     Cranial Nerves: No cranial nerve deficit.     Motor: No abnormal muscle tone.     Coordination: Coordination normal.  Psychiatric:        Mood and Affect: Mood normal.        Behavior: Behavior normal.        Thought Content: Thought content normal.        Judgment: Judgment normal.     ED Results / Procedures / Treatments   Labs (all labs ordered are listed, but only abnormal results are displayed) Labs Reviewed  COMPREHENSIVE METABOLIC PANEL - Abnormal; Notable for the following components:      Result Value   CO2 21 (*)    Glucose, Bld 216 (*)    ALT 66 (*)    All other components within normal limits  URINALYSIS, ROUTINE W REFLEX MICROSCOPIC - Abnormal; Notable for the following  components:   Color, Urine AMBER (*)    Glucose, UA 50 (*)    Ketones, ur 5 (*)    Protein, ur 100 (*)    All other components within normal limits  LIPASE, BLOOD  CBC    EKG None  Radiology CT Abdomen Pelvis W Contrast  Result Date: 05/27/2020 CLINICAL DATA:  Right-sided abdominal pain with nausea vomiting and diarrhea EXAM: CT ABDOMEN AND PELVIS WITH CONTRAST TECHNIQUE: Multidetector CT imaging of the abdomen and pelvis was performed using the standard protocol following bolus administration of intravenous contrast. CONTRAST:  137m OMNIPAQUE IOHEXOL 300 MG/ML  SOLN COMPARISON:  CT 01/21/2017 FINDINGS: Lower chest: Lung bases demonstrate no acute consolidation or pleural effusion. Hepatobiliary: No calcified gallstone or biliary dilatation. Subcentimeter hypodensity within the right hepatic dome too small to further characterize. Pancreas: Normal appearance of the pancreatic head, proximal body and uncinate process. Truncated appearance of pancreas with poor visibility of distal body and tail. Retraction of adjacent vascular and soft tissue structures including the stomach, though without significant change compared to prior. Spleen: Normal in size without focal abnormality. Adrenals/Urinary Tract: Adrenal glands are unremarkable. Kidneys are normal, without renal calculi, focal lesion, or hydronephrosis. Bladder is unremarkable. Stomach/Bowel: Stomach is within normal limits. Appendix not well seen but no right lower quadrant inflammatory process. No evidence of bowel wall thickening, distention, or inflammatory changes. Vascular/Lymphatic: No significant vascular findings are present. No enlarged abdominal or pelvic lymph nodes. Reproductive: Uterus and bilateral adnexa are unremarkable. Other: No abdominal  wall hernia or abnormality. No abdominopelvic ascites. Musculoskeletal: Chronic compression deformity at L1. IMPRESSION: 1. No CT evidence for acute intra-abdominal or pelvic abnormality. 2.  Similar abnormal appearance of the distal body and tail of the pancreas with suspected focal scarring and atrophy and adherence of adjacent vascular and soft tissue structures including the stomach. Electronically Signed   By: Donavan Foil M.D.   On: 05/27/2020 22:00    Procedures .Critical Care Performed by: Daleen Bo, MD Authorized by: Daleen Bo, MD   Critical care provider statement:    Critical care time (minutes):  35   Critical care start time:  05/27/2020 9:10 PM   Critical care end time:  05/27/2020 11:32 PM   Critical care time was exclusive of:  Separately billable procedures and treating other patients   Critical care was time spent personally by me on the following activities:  Blood draw for specimens, development of treatment plan with patient or surrogate, discussions with consultants, evaluation of patient's response to treatment, examination of patient, obtaining history from patient or surrogate, ordering and performing treatments and interventions, ordering and review of laboratory studies, pulse oximetry, re-evaluation of patient's condition, review of old charts and ordering and review of radiographic studies   (including critical care time)  Medications Ordered in ED Medications  sodium chloride flush (NS) 0.9 % injection 3 mL (has no administration in time range)  loperamide (IMODIUM) capsule 4 mg (has no administration in time range)  sodium chloride (PF) 0.9 % injection (has no administration in time range)  labetalol (NORMODYNE) injection 20 mg (20 mg Intravenous Given 05/27/20 2304)  ondansetron (ZOFRAN-ODT) disintegrating tablet 8 mg (has no administration in time range)  sodium chloride 0.9 % bolus 2,000 mL (2,000 mLs Intravenous New Bag/Given 05/27/20 2137)  ondansetron (ZOFRAN) injection 4 mg (4 mg Intravenous Given 05/27/20 2136)  iohexol (OMNIPAQUE) 300 MG/ML solution 100 mL (100 mLs Intravenous Contrast Given 05/27/20 2142)    ED Course  I have  reviewed the triage vital signs and the nursing notes.  Pertinent labs & imaging results that were available during my care of the patient were reviewed by me and considered in my medical decision making (see chart for details).  Clinical Course as of May 28 2331  Thu May 27, 2020  2236 Normal except glucose high, ketones present, protein present  Urinalysis, Routine w reflex microscopic(!) [EW]  2236 Normal except CO2 low, glucose high, ALT high  Comprehensive metabolic panel(!) [EW]  5852 Normal  CBC [EW]  2237 Normal  Lipase, blood [EW]  2237 Per radiologist, no acute abnormalities.  Similar CT appearance of pancreas with scarring, to that seen on scan in 2018.  CT Abdomen Pelvis W Contrast [EW]    Clinical Course User Index [EW] Daleen Bo, MD   MDM Rules/Calculators/A&P                           Patient Vitals for the past 24 hrs:  BP Temp Temp src Pulse Resp SpO2 Height Weight  05/27/20 2320 (!) 145/91 -- -- 89 15 100 % -- --  05/27/20 2319 (!) 149/108 -- -- -- 15 -- -- --  05/27/20 2115 (!) 180/125 -- -- (!) 124 14 99 % -- --  05/27/20 2043 (!) 179/117 -- -- (!) 109 15 99 % -- --  05/27/20 2030 (!) 172/129 -- -- (!) 122 15 98 % -- --  05/27/20 1808 -- -- -- -- -- --  5' 4"  (1.626 m) 81.6 kg  05/27/20 1805 (!) 165/113 -- Oral (!) 118 18 99 % -- --  05/27/20 1804 -- 98.6 F (37 C) Oral -- -- -- -- --    10:55 PM Reevaluation with update and discussion. After initial assessment and treatment, an updated evaluation reveals she is more comfortable now has not vomited since being treated with Zofran and not had any diarrhea since this morning.  Blood pressure is elevated will require treatment prior to disposition.  Patient wants to go home. Daleen Bo   11:20 PM-blood pressure now 145/91 after single dose of labetalol.  Patient remains comfortable and ready to go home.  Will give a dose of Zofran prior to discharge and sent a prescription to her pharmacy.  Medical  Decision Making:  This patient is presenting for evaluation of vomiting and diarrhea, which does require a range of treatment options, and is a complaint that involves a moderate risk of morbidity and mortality. The differential diagnoses include gastroenteritis, bowel obstruction, colitis. I decided to review old records, and in summary patient with history of hypertension and diabetes, unable to tolerate medications, presenting with prolonged diarrhea and vomiting..  I did not require additional historical information from anyone.  Clinical Laboratory Tests Ordered, included CBC, Metabolic panel and Urinalysis. Review indicates remarkably normal labs for 2 weeks of symptoms of vomiting and diarrhea.  Mild elevation of glucose at 216, CO2 slightly low 21.  ALT elevated 66.  Lipase normal.. Radiologic Tests Ordered, included CT abdomen pelvis.  I independently Visualized: CT images, which show stable chronic findings without acute abnormalities    Critical Interventions-clinical evaluation, laboratory testing, CT imaging, observation reassessment  After These Interventions, the Patient was reevaluated and was found with mild hypertension, worsening since arrival.  CRITICAL CARE-yes Performed by: Daleen Bo  Nursing Notes Reviewed/ Care Coordinated Applicable Imaging Reviewed Interpretation of Laboratory Data incorporated into ED treatment  The patient appears reasonably screened and/or stabilized for discharge and I doubt any other medical condition or other [facility] requiring further screening, evaluation, or treatment in the ED at this time prior to discharge.  Plan: Home Medications-continue usual; Home Treatments-gradual advance diet; return here if the recommended treatment, does not improve the symptoms; Recommended follow up-PCP, as needed     Final Clinical Impression(s) / ED Diagnoses Final diagnoses:  Nausea vomiting and diarrhea  Secondary hypertension    Rx / DC  Orders ED Discharge Orders         Ordered    ondansetron (ZOFRAN) 8 MG tablet  Every 8 hours PRN     Discontinue  Reprint     05/27/20 2330           Daleen Bo, MD 05/27/20 2332

## 2020-05-27 NOTE — Telephone Encounter (Signed)
Pt. Request Ambien refill to [facility].

## 2020-05-27 NOTE — ED Provider Notes (Signed)
[place]   MRN: [id number] DOB: 09-02-1956  Subjective:   Paige Andrews is a 64 y.o. female presenting for 2-week history of persistent nausea with vomiting, intermittent diarrhea and worsening mid to right-sided upper abdominal pain.  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  She has not had medical insurance for the past 2 to 3 years and therefore did not get follow-up.  Patient also has a history of high blood pressure, celiac disease, diabetes.  No current facility-administered medications for this encounter.  Current Outpatient Medications:  .  ACCU-CHEK SOFTCLIX LANCETS lancets, Use as instructed to test sugar once daily, Disp: 100 each, Rfl: 12 .  aluminum chloride (DRYSOL) 20 % external solution, Apply topically at bedtime., Disp: 35 mL, Rfl: 1 .  atorvastatin (LIPITOR) 80 MG tablet, Take 1 tablet (80 mg total) by mouth daily., Disp: 90 tablet, Rfl: 1 .  baclofen (LIORESAL) 10 MG tablet, Take 1 tablet (10 mg total) by mouth 3 (three) times daily., Disp: 90 tablet, Rfl: 6 .  Blood Glucose Monitoring Suppl (ACCU-CHEK AVIVA PLUS) w/Device KIT, Use as instructed to test sugar once daily, Disp: 1 kit, Rfl: 0 .  busPIRone (BUSPAR) 10 MG tablet, Take 10 mg by mouth 3 (three) times daily., Disp: , Rfl:  .  butalbital-acetaminophen-caffeine (FIORICET) 50-325-40 MG tablet, Take 1 tablet by mouth every 6 (six) hours as needed for headache., Disp: 20 tablet, Rfl: 0 .  cetirizine (ZYRTEC) 10 MG tablet, Take 1 tablet (10 mg total) by mouth daily., Disp: 30 tablet, Rfl: 1 .  DULoxetine (CYMBALTA) 60 MG capsule, Take 1 capsule (60 mg total) by mouth daily., Disp: 30 capsule, Rfl: 3 .  gabapentin (NEURONTIN) 300 MG capsule, Take 1 capsule (300 mg total) by mouth 2 (two) times daily., Disp: 60 capsule, Rfl: 6 .  glipiZIDE (GLUCOTROL) 5 MG tablet, Take 0.5 tablets (2.5 mg  total) by mouth 2 (two) times daily before a meal., Disp: 30 tablet, Rfl: 6 .  glucose blood (ACCU-CHEK AVIVA PLUS) test strip, Use as instructed to test sugar once daily, Disp: 100 each, Rfl: 12 .  hydrOXYzine (ATARAX/VISTARIL) 25 MG tablet, TAKE 1 TABLET BY MOUTH 3 TIMES DAILY AS NEEDED., Disp: 60 tablet, Rfl: 2 .  Lancets Misc. (ACCU-CHEK FASTCLIX LANCET) KIT, Use as directed once daily, Disp: 1 kit, Rfl: 0 .  losartan-hydrochlorothiazide (HYZAAR) 50-12.5 MG tablet, Take 1 tablet by mouth daily., Disp: 30 tablet, Rfl: 6 .  lurasidone (LATUDA) 40 MG TABS tablet, Take 40 mg by mouth daily with breakfast., Disp: , Rfl:  .  meloxicam (MOBIC) 7.5 MG tablet, Take 1 tablet (7.5 mg total) by mouth daily., Disp: 30 tablet, Rfl: 1 .  metFORMIN (GLUCOPHAGE) 500 MG tablet, Take 2 tablets (1,000 mg total) by mouth 2 (two) times daily with a meal., Disp: 360 tablet, Rfl: 1 .  ondansetron (ZOFRAN) 4 MG tablet, Take 1 tablet (4 mg total) by mouth every 6 (six) hours., Disp: 12 tablet, Rfl: 0 .  pantoprazole (PROTONIX) 40 MG tablet, Take 1 tablet (40 mg total) by mouth daily., Disp: 60 tablet, Rfl: 6 .  topiramate (TOPAMAX) 50 MG tablet, Take 1 tablet (50 mg total) by mouth 2 (two) times daily., Disp: 60 tablet, Rfl: 3 .  zolpidem (AMBIEN) 5 MG tablet, TAKE 1 TABLET BY MOUTH AT BEDTIME AS NEEDED FOR SLEEP, Disp: 30 tablet, Rfl: 0   Allergies  Allergen Reactions  .  Morphine And Related Anxiety    Patient states, " it makes me  feel like I am crawling out of my skin"    Past Medical History:  Diagnosis Date  . Anxiety   . Bipolar 1 disorder ([place])   . Celiac disease   . Depression   . Hypertension   . IBS (irritable bowel syndrome)      Past Surgical History:  Procedure Laterality Date  . carpel tunnel Right   . CESAREAN SECTION    . SHOULDER SURGERY Right     Family History  Problem Relation Age of Onset  . Dementia Mother   . Breast cancer Mother   . Pancreatitis Father   . Dementia Father      Social History   Tobacco Use  . Smoking status: Never Smoker  . Smokeless tobacco: Never Used  Vaping Use  . Vaping Use: Never used  Substance Use Topics  . 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 Drug use: No    ROS   Objective:   Vitals: BP (!) 159/115 (BP Location: Left Arm)   Pulse (!) 110   Temp 99.4 F (37.4 C) (Oral)   Resp (!) 22   LMP 12/11/2014 (Approximate)   SpO2 100%   Pulse recheck was 110-116.  Physical Exam Constitutional:      General: She is in acute distress.     Appearance: Normal appearance. She is well-developed. She is obese. She is not ill-appearing, toxic-appearing or diaphoretic.  HENT:     Head: Normocephalic and atraumatic.     Right Ear: External ear normal.     Left Ear: External ear normal.     Nose: Nose normal.     Mouth/Throat:     Mouth: Mucous membranes are moist.     Pharynx: Oropharynx is clear.  Eyes:     General: No scleral icterus.    Extraocular Movements: Extraocular movements intact.     Pupils: Pupils are equal, round, and reactive to light.  Cardiovascular:     Rate and Rhythm: Normal rate and regular rhythm.     Heart sounds: Normal heart sounds. No murmur heard.  No friction rub. No gallop.   Pulmonary:     Effort: Pulmonary effort is normal. No respiratory distress.     Breath sounds: Normal breath sounds. No stridor. No wheezing, rhonchi or rales.  Abdominal:     General: Bowel sounds are normal. There is no distension.     Palpations: Abdomen is soft. There is no mass.     Tenderness: There is abdominal tenderness in the right upper quadrant and epigastric area. There is no right CVA tenderness, left CVA tenderness, guarding or rebound.  Skin:    General: Skin is warm and dry.     Coloration: Skin is not pale.     Findings: No rash.  Neurological:     General: No focal deficit present.     Mental Status: She is alert and oriented to person,  place, and time.  Psychiatric:        Mood and Affect: Mood normal.        Behavior: Behavior normal.        Thought Content: Thought content normal.        Judgment: Judgment normal.    Results for orders placed or performed during the hospital encounter of 05/27/20 (from the past 24 hour(s))  POC CBG monitoring     Status: Abnormal   Collection Time: 05/27/20  3:21 PM  Result Value Ref Range   Glucose-Capillary 218 (H) 70 - 99 mg/dL   ED ECG REPORT   Date: 05/27/2020  Rate: 106 bpm  Rhythm: sinus tachycardia  QRS Axis: normal  Intervals: normal  ST/T Wave abnormalities: normal  Conduction Disutrbances:none  Narrative Interpretation: Sinus tachycardia at 106 bpm.  Old EKG Reviewed: unchanged  I have personally reviewed the EKG tracing and agree with the computerized printout as noted.   Assessment and Plan :   PDMP not reviewed this encounter.  1. Pain of upper abdomen   2. Uncontrolled type 2 diabetes mellitus with hyperglycemia ([place])   3. Nausea   4. Nausea and vomiting, intractability of vomiting not specified, unspecified vomiting type   5. Diarrhea, unspecified type     Differential includes recurrent pancreatitis, hepatitis, transaminitis, acute abdomen.  Patient is in need of CT scan.  She was given IM Zofran in clinic, IM Toradol in clinic.  Patient contracts for safety and states that her boyfriend can drive her to the nearest emergency room.  Verbalized understanding risks of acute abdomen and insists that she will go to the emergency room.   Jaynee Eagles, [place] 05/27/20 1559

## 2020-05-28 MED ORDER — ZOLPIDEM TARTRATE 5 MG PO TABS: 5.0000 mg | ORAL_TABLET | Freq: Every evening | ORAL | 0 refills | Status: DC | PRN

## 2020-06-04 ENCOUNTER — Encounter (HOSPITAL_COMMUNITY): Payer: Self-pay | Admitting: Emergency Medicine

## 2020-06-04 ENCOUNTER — Other Ambulatory Visit: Payer: Self-pay

## 2020-06-04 ENCOUNTER — Emergency Department (HOSPITAL_COMMUNITY)
Admission: EM | Admit: 2020-06-04 | Discharge: 2020-06-04 | Disposition: A | Discharge status: Home / Self Care | Payer: Medicaid Other | Attending: Emergency Medicine | Admitting: Emergency Medicine

## 2020-06-04 ENCOUNTER — Emergency Department (HOSPITAL_COMMUNITY): Payer: Medicaid Other

## 2020-06-04 DIAGNOSIS — R739 Hyperglycemia, unspecified: Secondary | ICD-10-CM

## 2020-06-04 DIAGNOSIS — I10 Essential (primary) hypertension: Secondary | ICD-10-CM | POA: Diagnosis not present

## 2020-06-04 DIAGNOSIS — K920 Hematemesis: Secondary | ICD-10-CM | POA: Diagnosis not present

## 2020-06-04 DIAGNOSIS — Z79899 Other long term (current) drug therapy: Secondary | ICD-10-CM | POA: Insufficient documentation

## 2020-06-04 DIAGNOSIS — R112 Nausea with vomiting, unspecified: Secondary | ICD-10-CM | POA: Diagnosis present

## 2020-06-04 DIAGNOSIS — Z7984 Long term (current) use of oral hypoglycemic drugs: Secondary | ICD-10-CM | POA: Diagnosis not present

## 2020-06-04 DIAGNOSIS — E1165 Type 2 diabetes mellitus with hyperglycemia: Secondary | ICD-10-CM | POA: Insufficient documentation

## 2020-06-04 DIAGNOSIS — R1011 Right upper quadrant pain: Secondary | ICD-10-CM

## 2020-06-04 DIAGNOSIS — R55 Syncope and collapse: Secondary | ICD-10-CM | POA: Insufficient documentation

## 2020-06-04 DIAGNOSIS — R000 Tachycardia, unspecified: Secondary | ICD-10-CM | POA: Insufficient documentation

## 2020-06-04 DIAGNOSIS — J45909 Unspecified asthma, uncomplicated: Secondary | ICD-10-CM | POA: Insufficient documentation

## 2020-06-04 DIAGNOSIS — R1110 Vomiting, unspecified: Secondary | ICD-10-CM

## 2020-06-04 LAB — COMPREHENSIVE METABOLIC PANEL
ALT: 41 U/L (ref 0–44)
AST: 30 U/L (ref 15–41)
Albumin: 3.9 g/dL (ref 3.5–5.0)
Alkaline Phosphatase: 63 U/L (ref 38–126)
Anion gap: 7 (ref 5–15)
BUN: 9 mg/dL (ref 8–23)
CO2: 30 mmol/L (ref 22–32)
Calcium: 9.3 mg/dL (ref 8.9–10.3)
Chloride: 98 mmol/L (ref 98–111)
Creatinine, Ser: 0.95 mg/dL (ref 0.44–1.00)
GFR calc Af Amer: >60 mL/min (ref >60)
GFR calc non Af Amer: >60 mL/min (ref >60)
Glucose, Bld: 327 mg/dL — ABNORMAL HIGH (ref 70–99)
Potassium: 3.5 mmol/L (ref 3.5–5.1)
Sodium: 135 mmol/L (ref 135–145)
Total Bilirubin: 1.3 mg/dL — ABNORMAL HIGH (ref 0.3–1.2)
Total Protein: 7 g/dL (ref 6.5–8.1)

## 2020-06-04 LAB — CBC
HCT: 42.3 % (ref 36.0–46.0)
Hemoglobin: 14.6 g/dL (ref 12.0–15.0)
MCH: 31.5 pg (ref 26.0–34.0)
MCHC: 34.5 g/dL (ref 30.0–36.0)
MCV: 91.2 fL (ref 80.0–100.0)
Platelets: 256 10*3/uL (ref 150–400)
RBC: 4.64 MIL/uL (ref 3.87–5.11)
RDW: 12.3 % (ref 11.5–15.5)
WBC: 8 10*3/uL (ref 4.0–10.5)
nRBC: 0 % (ref 0.0–0.2)

## 2020-06-04 LAB — URINALYSIS, ROUTINE W REFLEX MICROSCOPIC
Bilirubin Urine: NEGATIVE
Glucose, UA: >=500 mg/dL — AB
Hgb urine dipstick: NEGATIVE
Ketones, ur: 5 mg/dL — AB
Leukocytes,Ua: NEGATIVE
Nitrite: NEGATIVE
Protein, ur: NEGATIVE mg/dL
Specific Gravity, Urine: 1.018 (ref 1.005–1.030)
pH: 7 (ref 5.0–8.0)

## 2020-06-04 LAB — BETA-HYDROXYBUTYRIC ACID: Beta-Hydroxybutyric Acid: 0.53 mmol/L — ABNORMAL HIGH (ref 0.05–0.27)

## 2020-06-04 LAB — LIPASE, BLOOD: Lipase: 26 U/L (ref 11–51)

## 2020-06-04 LAB — CBG MONITORING, ED: Glucose-Capillary: 220 mg/dL — ABNORMAL HIGH (ref 70–99)

## 2020-06-04 MED ORDER — PROMETHAZINE HCL 25 MG PO TABS
25.0000 mg | ORAL_TABLET | Freq: Four times a day (QID) | ORAL | 0 refills | Status: DC | PRN
Start: 2020-06-04 — End: 2020-10-19

## 2020-06-04 MED ORDER — OXYCODONE HCL 5 MG PO TABS: 5.0000 mg | ORAL_TABLET | Freq: Four times a day (QID) | ORAL | 0 refills | Status: DC | PRN

## 2020-06-04 MED ORDER — METOCLOPRAMIDE HCL 5 MG/ML IJ SOLN
10.0000 mg | Freq: Once | INTRAMUSCULAR | Status: AC
  Administered 2020-06-04: 10 mg via INTRAVENOUS
  Filled 2020-06-04: qty 2

## 2020-06-04 MED ORDER — BACLOFEN 10 MG PO TABS
10.0000 mg | ORAL_TABLET | Freq: Once | ORAL | Status: AC
  Administered 2020-06-04: 10 mg via ORAL
  Filled 2020-06-04: qty 1

## 2020-06-04 MED ORDER — ONDANSETRON HCL 4 MG/2ML IJ SOLN: 4.0000 mg | Freq: Once | INTRAMUSCULAR | Status: DC

## 2020-06-04 MED ORDER — SODIUM CHLORIDE 0.9 % IV BOLUS
1000.0000 mL | Freq: Once | INTRAVENOUS | Status: AC
  Administered 2020-06-04: 1000 mL via INTRAVENOUS

## 2020-06-04 MED ORDER — PANTOPRAZOLE SODIUM 40 MG IV SOLR
40.0000 mg | Freq: Once | INTRAVENOUS | Status: AC
  Administered 2020-06-04: 40 mg via INTRAVENOUS
  Filled 2020-06-04: qty 40

## 2020-06-04 MED ORDER — FAMOTIDINE 20 MG PO TABS
20.0000 mg | ORAL_TABLET | Freq: Two times a day (BID) | ORAL | 0 refills | Status: DC
Start: 2020-06-04 — End: 2020-10-19

## 2020-06-04 MED ORDER — SUCRALFATE 1 G PO TABS
1.0000 g | ORAL_TABLET | Freq: Three times a day (TID) | ORAL | 0 refills | Status: DC
Start: 2020-06-04 — End: 2021-01-21

## 2020-06-04 NOTE — Discharge Instructions (Signed)
Please read and follow all provided instructions.  Your diagnoses today include:  1. Vomiting   2. RUQ pain   3. Hyperglycemia   4. Hematemesis with nausea     Tests performed today include:  Blood counts and electrolytes  Blood tests to check liver and kidney function  Blood tests to check pancreas function  Urine test to look for infection  Ultrasound of your gallbladder -- no gallstones  Vital signs. See below for your results today.   Medications prescribed:   Pepcid (famotidine) - antihistamine  You can find this medication over-the-counter.   DO NOT exceed:   36m Pepcid every 12 hours   Carafate - for stomach upset and to protect your stomach   Phenergan (promethazine) - for nausea and vomiting   Oxycodone - narcotic pain medication  DO NOT drive or perform any activities that require you to be awake and alert because this medicine can make you drowsy.   Take any prescribed medications only as directed.  Home care instructions:   Follow any educational materials contained in this packet.  Follow-up instructions: Please follow-up with your primary care provider in the next 2 days for further evaluation of your symptoms.    Return instructions:  SEEK IMMEDIATE MEDICAL ATTENTION IF:  The pain does not go away or becomes severe   A temperature above 101F develops   Repeated vomiting occurs (multiple episodes)   The pain becomes localized to portions of the abdomen. The right side could possibly be appendicitis. In an adult, the left lower portion of the abdomen could be colitis or diverticulitis.   Blood is being passed in stools or vomit (bright red or black tarry stools)   You develop chest pain, difficulty breathing, dizziness or fainting, or become confused, poorly responsive, or inconsolable (young children)  If you have any other emergent concerns regarding your health  Additional Information: Abdominal (belly) pain can be caused by many  things. Your caregiver performed an examination and possibly ordered blood/urine tests and imaging (CT scan, x-rays, ultrasound). Many cases can be observed and treated at home after initial evaluation in the emergency department. Even though you are being discharged home, abdominal pain can be unpredictable. Therefore, you need a repeated exam if your pain does not resolve, returns, or worsens. Most patients with abdominal pain don't have to be admitted to the hospital or have surgery, but serious problems like appendicitis and gallbladder attacks can start out as nonspecific pain. Many abdominal conditions cannot be diagnosed in one visit, so follow-up evaluations are very important.  Your vital signs today were: BP (!) 124/98   Pulse 100   Temp 98.1 F (36.7 C) (Oral)   Resp (!) 21   Ht 5' 4"  (1.626 m)   Wt 82 kg   LMP 12/11/2014 (Approximate)   SpO2 100%   BMI 31.03 kg/m  If your blood pressure (bp) was elevated above 135/85 this visit, please have this repeated by your doctor within one month. --------------

## 2020-06-04 NOTE — ED Provider Notes (Signed)
[facility] DEPT Provider Note   CSN: [id number] Arrival date & time: 06/04/20  5009     History Chief Complaint  Patient presents with   Loss of Consciousness   Emesis    Paige Andrews is a 64 y.o. female.  Patient with history of diabetes, IBS, bipolar disorder presents to the emergency department today with complaint of ongoing nausea, vomiting, diarrhea over the past 2 to 3 weeks with near syncopal episode today.  Patient reports having daily episodes of watery, nonbloody stool.  She has had frequent episodes of vomiting, every day, with red blood noted intermittently over the past several days and then a larger amount this morning.  Patient is on Metformin for diabetes and states that she has been unable to take any of her home oral medications due to the vomiting.  This morning she had an episode of vomiting and was sitting on the toilet.  When she stood up she nearly passed out.  She was able to lower herself to the floor without fully passing out or hitting her head.  This prompted call to EMS who transported and give the patient 1 L of fluid and 4 mg of Zofran.  Blood in the vomit is bright red.  Patient reports daily use of ibuprofen for chronic pain in her legs.  She denies alcohol use.  With her symptoms, she has intermittent right upper quadrant pain.  No chest pain or shortness of breath.  No fevers, chills.  No hematuria or irritative UTI symptoms including dysuria, increased frequency or urgency. CBG 411 PTA, tachycardia 116.           Past Medical History:  Diagnosis Date   Anxiety    Bipolar 1 disorder ([place])    Celiac disease    Depression    Hypertension    IBS (irritable bowel syndrome)     Patient Active Problem List   Diagnosis Date Noted   Bipolar 1 disorder ([place]) 04/03/2019   Anxiety 04/03/2019   Hypertension 08/26/2018   Diabetes ([place]) 01/01/2018   Asthma 09/06/2017   Depression 06/08/2017   Back pain  06/08/2017   Shoulder pain 06/08/2017   Insomnia 06/08/2017   IBS (irritable bowel syndrome) 03/30/2017   GERD (gastroesophageal reflux disease) 03/30/2017   Constipation 01/23/2017   Nausea & vomiting 01/21/2017   Transaminitis 01/21/2017   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/21/2017   Injury of right hand 09/14/2015   Rash 09/14/2015    Past Surgical History:  Procedure Laterality Date   carpel tunnel Right    CESAREAN SECTION     SHOULDER SURGERY Right      OB History    Gravida  0   Para      Term      Preterm      AB      Living        SAB      TAB      Ectopic      Multiple      Live Births              Family History  Problem Relation Age of Onset   Dementia Mother    Breast cancer Mother    Pancreatitis Father    Dementia Father     Social History   Tobacco Use   Smoking status: Never Smoker   Smokeless tobacco: Never Used  Vaping Use   Vaping Use: Never used  Substance Use Topics   Alcohol use:  Not Currently    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 Authorizing Provider  ACCU-CHEK SOFTCLIX LANCETS lancets Use as instructed to test sugar once daily 08/26/18   Charlott Rakes, MD  aluminum chloride (DRYSOL) 20 % external solution Apply topically at bedtime. 10/16/18   Charlott Rakes, MD  atorvastatin (LIPITOR) 80 MG tablet Take 1 tablet (80 mg total) by mouth daily. 02/02/20   Charlott Rakes, MD  baclofen (LIORESAL) 10 MG tablet Take 1 tablet (10 mg total) by mouth 3 (three) times daily. 02/02/20   Charlott Rakes, MD  Blood Glucose Monitoring Suppl (ACCU-CHEK AVIVA PLUS) w/Device KIT Use as instructed to test sugar once daily 07/23/18   Charlott Rakes, MD  busPIRone (BUSPAR) 10 MG tablet Take 10 mg by mouth 3 (three) times daily.    [provider]    butalbital-acetaminophen-caffeine (FIORICET) 50-325-40 MG tablet Take 1 tablet by mouth every 6 (six) hours as needed for headache. 10/22/19 10/21/20  Charlott Rakes, MD  cetirizine (ZYRTEC) 10 MG tablet Take 1 tablet (10 mg total) by mouth daily. 04/03/18   Charlott Rakes, MD  DULoxetine (CYMBALTA) 60 MG capsule Take 1 capsule (60 mg total) by mouth daily. 10/22/19   Charlott Rakes, MD  gabapentin (NEURONTIN) 300 MG capsule Take 1 capsule (300 mg total) by mouth 2 (two) times daily. 02/02/20   Charlott Rakes, MD  glipiZIDE (GLUCOTROL) 5 MG tablet Take 0.5 tablets (2.5 mg total) by mouth 2 (two) times daily before a meal. 02/02/20   Charlott Rakes, MD  glucose blood (ACCU-CHEK AVIVA PLUS) test strip Use as instructed to test sugar once daily 07/23/18   Charlott Rakes, MD  hydrOXYzine (ATARAX/VISTARIL) 25 MG tablet TAKE 1 TABLET BY MOUTH 3 TIMES DAILY AS NEEDED. 10/31/19   Charlott Rakes, MD  Lancets Misc. (ACCU-CHEK FASTCLIX LANCET) KIT Use as directed once daily 07/29/18   Charlott Rakes, MD  losartan-hydrochlorothiazide (HYZAAR) 50-12.5 MG tablet Take 1 tablet by mouth daily. 02/02/20   Charlott Rakes, MD  lurasidone (LATUDA) 40 MG TABS tablet Take 40 mg by mouth daily with breakfast.    [provider]  meloxicam (MOBIC) 7.5 MG tablet Take 1 tablet (7.5 mg total) by mouth daily. 10/22/19   Charlott Rakes, MD  metFORMIN (GLUCOPHAGE) 500 MG tablet Take 2 tablets (1,000 mg total) by mouth 2 (two) times daily with a meal. 02/02/20   Charlott Rakes, MD  ondansetron (ZOFRAN) 8 MG tablet Take 1 tablet (8 mg total) by mouth every 8 (eight) hours as needed for nausea or vomiting. 05/27/20   Daleen Bo, MD  pantoprazole (PROTONIX) 40 MG tablet Take 1 tablet (40 mg total) by mouth daily. 02/02/20   Charlott Rakes, MD  topiramate (TOPAMAX) 50 MG tablet Take 1 tablet (50 mg total) by mouth 2 (two) times daily. 10/22/19   Charlott Rakes, MD  zolpidem (AMBIEN) 5 MG tablet Take 1 tablet (5 mg  total) by mouth at bedtime as needed. for sleep 05/28/20   Charlott Rakes, MD  albuterol (PROVENTIL HFA;VENTOLIN HFA) 108 (90 Base) MCG/ACT inhaler Inhale 2 puffs into the lungs every 6 (six) hours as needed for wheezing or shortness of breath. Patient not taking: Reported on 04/03/2019 07/23/18 09/29/19  Charlott Rakes, MD    Allergies    Morphine and related  Review of Systems   Review of Systems  Constitutional: Negative for fever.  HENT: Negative for rhinorrhea and sore throat.   Eyes: Negative for redness.  Respiratory: Negative for cough and shortness of breath.   Cardiovascular: Negative for chest pain.  Gastrointestinal: Positive for abdominal pain, diarrhea, nausea and vomiting. Negative for blood in stool.  Genitourinary: Negative for dysuria.  Musculoskeletal: Negative for myalgias.  Skin: Negative for rash.  Neurological: Positive for syncope and light-headedness. Negative for headaches.    Physical Exam Updated Vital Signs BP (!) 118/96    Pulse 95    Temp 98.1 F (36.7 C) (Oral)    Resp 14    Ht 5' 4"  (1.626 m)    Wt 82 kg    LMP 12/11/2014 (Approximate)    SpO2 96%    BMI 31.03 kg/m   Physical Exam Vitals and nursing note reviewed.  Constitutional:      Appearance: She is well-developed.  HENT:     Head: Normocephalic and atraumatic.     Mouth/Throat:     Mouth: Mucous membranes are dry.     Pharynx: No posterior oropharyngeal erythema.  Eyes:     General:        Right eye: No discharge.        Left eye: No discharge.     Conjunctiva/sclera: Conjunctivae normal.  Cardiovascular:     Rate and Rhythm: Regular rhythm. Tachycardia present.     Heart sounds: Normal heart sounds.     Comments: Mild tachycardia low 100s. Pulmonary:     Effort: Pulmonary effort is normal.     Breath sounds: Normal breath sounds.  Abdominal:     Palpations: Abdomen is soft.     Tenderness: There is abdominal tenderness. There is no guarding or rebound.     Comments: Mild right  upper quadrant tenderness.  Negative Murphy sign.  Musculoskeletal:     Cervical back: Normal range of motion and neck supple.  Skin:    General: Skin is warm and dry.  Neurological:     Mental Status: She is alert.     ED Results / Procedures / Treatments   Labs (all labs ordered are listed, but only abnormal results are displayed) Labs Reviewed  COMPREHENSIVE METABOLIC PANEL - Abnormal; Notable for the following components:      Result Value   Glucose, Bld 327 (*)    Total Bilirubin 1.3 (*)    All other components within normal limits  URINALYSIS, ROUTINE W REFLEX MICROSCOPIC - Abnormal; Notable for the following components:   Glucose, UA >=500 (*)    Ketones, ur 5 (*)    Bacteria, UA RARE (*)    All other components within normal limits  BETA-HYDROXYBUTYRIC ACID - Abnormal; Notable for the following components:   Beta-Hydroxybutyric Acid 0.53 (*)    All other components within normal limits  CBG MONITORING, ED - Abnormal; Notable for the following components:   Glucose-Capillary 220 (*)    All other components within normal limits  LIPASE, BLOOD  CBC    EKG EKG Interpretation  Date/Time:  Friday June 04 2020 10:31:52 EDT Ventricular Rate:  103 PR Interval:    QRS Duration: 94 QT Interval:  373 QTC Calculation: 489 R Axis:   -37 Text Interpretation: Sinus tachycardia Left axis deviation Low voltage, extremity leads Borderline prolonged QT interval No significant change since last tracing Confirmed by Gareth Morgan [phone]) on 06/04/2020 12:25:04 PM   Radiology US Abdomen Limited RUQ  Result Date: 06/04/2020 CLINICAL DATA:  Right upper quadrant  pain and vomiting. EXAM: ULTRASOUND ABDOMEN LIMITED RIGHT UPPER QUADRANT COMPARISON:  CT abdomen and pelvis 05/27/2020. FINDINGS: Gallbladder: No gallstones or wall thickening visualized. No sonographic Murphy sign noted by sonographer. Common bile duct: Diameter: 0.4 cm Liver: No focal lesion. Echogenicity is increased.  Portal vein is patent on color Doppler imaging with normal direction of blood flow towards the liver. Other: None. IMPRESSION: No acute abnormality.  Negative for gallstones. Fatty infiltration of the liver. Electronically Signed   By: Inge Rise M.D.   On: 06/04/2020 11:48    Procedures Procedures (including critical care time)  Medications Ordered in ED Medications  sodium chloride 0.9 % bolus 1,000 mL (0 mLs Intravenous Stopped 06/04/20 1249)  pantoprazole (PROTONIX) injection 40 mg (40 mg Intravenous Given 06/04/20 1033)  metoCLOPramide (REGLAN) injection 10 mg (10 mg Intravenous Given 06/04/20 1033)  baclofen (LIORESAL) tablet 10 mg (10 mg Oral Given 06/04/20 1217)  sodium chloride 0.9 % bolus 1,000 mL (0 mLs Intravenous Stopped 06/04/20 1454)    ED Course  I have reviewed the triage vital signs and the nursing notes.  Pertinent labs & imaging results that were available during my care of the patient were reviewed by me and considered in my medical decision making (see chart for details).  Patient seen and examined. Work-up initiated. Medications ordered.   Vital signs reviewed and are as follows: BP (!) 118/96    Pulse 95    Temp 98.1 F (36.7 C) (Oral)    Resp 14    Ht 5' 4"  (1.626 m)    Wt 82 kg    LMP 12/11/2014 (Approximate)    SpO2 96%    BMI 31.03 kg/m   We will give additional fluids.  Patient with normal anion gap.  Work-up is not suggestive of DKA.  Will obtain right upper quadrant ultrasound.  Reviewed CT results from ED visit 1 week ago.  Will reassess.  11:57 AM [place] reviewed. Will PO challenge, check orthostatics, reassess.  2:51 PM patient improved during ED stay with hydration.  Pulse rate improved.  Right upper quadrant ultrasound did not show gallstones or other acute complications.  Orthostatic VS for the past 24 hrs:  BP- Lying Pulse- Lying BP- Sitting Pulse- Sitting BP- Standing at 0 minutes Pulse- Standing at 0 minutes  06/04/20 1212 129/88 100 (!) 124/94  106 119/78 109   Patient is tolerating oral fluids in the room without vomiting or significant discomfort.  Plan: Continue Protonix previously prescribed, add Pepcid and Carafate.  Will give #6 oxycodone for pain and Phenergan for nausea control at home.  Encouraged clear liquid diet with adequate fluid intake over the next several days.  Encourage PCP and GI follow-up.  Patient given referral.  We discussed need to avoid NSAIDs as these can exacerbate gastritis and peptic ulcer disease.  The patient was urged to return to the Emergency Department immediately with worsening of current symptoms, worsening abdominal pain, persistent vomiting, blood noted in stools, fever, or any other concerns. The patient verbalized understanding.   Patient counseled on use of narcotic pain medications. Counseled not to combine these medications with others containing tylenol. Urged not to drink alcohol, drive, or perform any other activities that requires focus while taking these medications. The patient verbalizes understanding and agrees with the plan.     MDM Rules/Calculators/A&P                          Presents with 2  to 3-week history of persistent nausea, vomiting, and diarrhea.  Patient had CT scan recently which did not have any acute findings.  Symptoms have persisted.  Patient does use NSAIDs regularly, ibuprofen daily.  Suspect patient has gastritis or peptic ulcer disease given her intractable symptoms.  Right upper quadrant ultrasound today was unrevealing.  Labs continue to look good.  Normal white blood cell count, normal lipase.  Normal kidney function.  Patient reported small volume hematemesis over the past several days, suspect Mallory-Weiss tears.  No peritonitis on exam.  Normal hemoglobin without elevated BUN.  Low concern for upper GI bleeding.  Patient does have hyperglycemia without evidence of DKA.  Her anion gap is normal and beta hydroxybutyrate is very slightly elevated.  Patient was  hydrated with improvement in blood sugar.  This is likely exacerbating her fluid status.  Patient had a near syncopal episode while using the toilet today.  No chest pain.  EKG today showed sinus tachycardia.  Do not suspect cardiac etiology of near syncope.  Patient looks well, treatment plan as above.  No indications for admission at this point as her symptoms are controlled and she is improving.  Strongly encouraged outpatient follow-up to continue evaluation and ensure that symptoms are improving.  Return instructions discussed as above.   Final Clinical Impression(s) / ED Diagnoses Final diagnoses:  Vomiting  RUQ pain  Hyperglycemia  Hematemesis with nausea    Rx / DC Orders ED Discharge Orders         Ordered    famotidine (PEPCID) 20 MG tablet  2 times daily     Discontinue  Reprint     06/04/20 1445    sucralfate (CARAFATE) 1 g tablet  3 times daily with meals & bedtime     Discontinue  Reprint     06/04/20 1445    oxyCODONE (OXY IR/ROXICODONE) 5 MG immediate release tablet  Every 6 hours PRN     Discontinue  Reprint     06/04/20 1445    promethazine (PHENERGAN) 25 MG tablet  Every 6 hours PRN     Discontinue  Reprint     06/04/20 1445           Carlisle Cater, PA-C 06/04/20 1503    Gareth Morgan, MD 06/05/20 1515

## 2020-06-04 NOTE — ED Triage Notes (Signed)
Arrives via EMS, C/C N/V/D, syncope and hyperglycemia. Over the past 3 weeks, endorses N/V/D daily. In the bathroom this morning around 0830, she had a syncopal episode, lowered herself to the floor. Did not hit her head. EMS gave 4 of Zofran, 1 L of fluid.   20 G RFA   CBG 411 (has not been able to take metformin D/T emesis) RR 20 BP 124/64 T 98.2 P 116

## 2020-06-30 MED FILL — hydrOXYzine HCL 50 MG TABS: 50 | 30 days supply | Qty: 90 | Fill #0

## 2020-07-01 MED FILL — VENLAFAXINE HCL ER 75 MG CA: 75 | 30 days supply | Qty: 30 | Fill #0

## 2020-07-01 MED FILL — VENLAFAXINE HCL ER 37.5 MG: 37.5 | 7 days supply | Qty: 7 | Fill #0

## 2020-07-09 MED FILL — PANTOPRAZOLE SOD DR 40 MG T: 40 | 90 days supply | Qty: 90 | Fill #1

## 2020-07-09 MED FILL — ATORVASTATIN 80 MG TABLET: 80 | 90 days supply | Qty: 90 | Fill #0

## 2020-07-09 MED FILL — hydrOXYzine HCL 50 MG TABS: 50 | 30 days supply | Qty: 90 | Fill #0

## 2020-07-09 MED FILL — METFORMIN HCL 500 MG TABS: 500 | 90 days supply | Qty: 360 | Fill #0

## 2020-07-10 ENCOUNTER — Other Ambulatory Visit: Payer: Self-pay | Admitting: Family Medicine

## 2020-07-10 DIAGNOSIS — G4709 Other insomnia: Secondary | ICD-10-CM

## 2020-07-10 NOTE — Telephone Encounter (Signed)
Requested medication (s) are due for refill today: yes  Requested medication (s) are on the active medication list: yes  Last refill:  05/28/20  Future visit scheduled: yes  Notes to clinic:  med not delegated to NT to RF   Requested Prescriptions  Pending Prescriptions Disp Refills   zolpidem (AMBIEN) 5 MG tablet [Pharmacy Med Name: Zolpidem Tartrate 5 MG Oral Tablet] 30 tablet 0    Sig: TAKE 1 TABLET BY MOUTH AT BEDTIME AS NEEDED FOR SLEEP      Not Delegated - Psychiatry:  Anxiolytics/Hypnotics Failed - 07/10/2020  3:13 PM      Failed - This refill cannot be delegated      Failed - Urine Drug Screen completed in last 360 days.      Failed - Valid encounter within last 6 months    Recent Outpatient Visits           5 months ago Type 2 diabetes mellitus with other neurologic complication, without long-term current use of insulin ([place])   [place], [place], MD   8 months ago Type 2 diabetes mellitus with other neurologic complication, without long-term current use of insulin ([place])   [place], [place], MD   11 months ago Spasm   [place], [place], MD   1 year ago Essential hypertension   [place], Charlane Ferretti, MD   1 year ago Type 2 diabetes mellitus with hyperglycemia, without long-term current use of insulin [facility])   [place], MD       Future Appointments             In 1 month Charlott Rakes, MD [place]

## 2020-07-14 MED FILL — VENLAFAXINE HCL ER 37.5 MG: 37.5 | 7 days supply | Qty: 7 | Fill #0

## 2020-07-14 MED FILL — VENLAFAXINE HCL ER 75 MG CA: 75 | 30 days supply | Qty: 30 | Fill #0

## 2020-08-10 ENCOUNTER — Other Ambulatory Visit: Payer: Self-pay | Admitting: Family Medicine

## 2020-08-10 DIAGNOSIS — G4709 Other insomnia: Secondary | ICD-10-CM

## 2020-08-10 NOTE — Telephone Encounter (Signed)
Requested medication (s) are due for refill today: no  Requested medication (s) are on the active medication list: yes   Last refill:  07/12/2020  Future visit scheduled: yes   Notes to clinic:  this refill cannot be delegated    Requested Prescriptions  Pending Prescriptions Disp Refills   zolpidem (AMBIEN) 5 MG tablet [Pharmacy Med Name: Zolpidem Tartrate 5 MG Oral Tablet] 30 tablet 0    Sig: TAKE 1 TABLET BY MOUTH AT BEDTIME AS NEEDED FOR SLEEP      Not Delegated - Psychiatry:  Anxiolytics/Hypnotics Failed - 08/10/2020  2:42 PM      Failed - This refill cannot be delegated      Failed - Urine Drug Screen completed in last 360 days.      Failed - Valid encounter within last 6 months    Recent Outpatient Visits           6 months ago Type 2 diabetes mellitus with other neurologic complication, without long-term current use of insulin ([place])   [place], [place], MD   9 months ago Type 2 diabetes mellitus with other neurologic complication, without long-term current use of insulin ([place])   [facility] Charlott Rakes, MD   1 year ago Spasm   [place], [place], MD   1 year ago Essential hypertension   [place], Charlane Ferretti, MD   1 year ago Type 2 diabetes mellitus with hyperglycemia, without long-term current use of insulin [facility])   [place], [place], MD       Future Appointments             In 2 weeks Charlott Rakes, MD [place]

## 2020-08-17 MED FILL — hydrOXYzine HCL 50 MG TABS: 50 | 30 days supply | Qty: 90 | Fill #1

## 2020-08-17 MED FILL — VENLAFAXINE HCL ER 37.5 MG: 37.5 | 30 days supply | Qty: 30 | Fill #0

## 2020-08-17 MED FILL — VENLAFAXINE HCL ER 75 MG CA: 75 | 30 days supply | Qty: 30 | Fill #1

## 2020-08-17 MED FILL — LOSARTAN-HCTZ 50-12.5 MG TA: 50-12.5 | 90 days supply | Qty: 90 | Fill #1

## 2020-08-19 MED FILL — BACLOFEN 10 MG TABLET: 10 | 90 days supply | Qty: 270 | Fill #1

## 2020-08-26 ENCOUNTER — Ambulatory Visit: Payer: Medicaid Other | Admitting: Family Medicine

## 2020-08-26 MED FILL — BACLOFEN 10 MG TABLET: 10 | 90 days supply | Qty: 270 | Fill #1

## 2020-09-14 MED FILL — BACLOFEN 10 MG TABLET: 10 | 90 days supply | Qty: 270 | Fill #1

## 2020-10-12 ENCOUNTER — Ambulatory Visit: Payer: Medicaid Other | Admitting: Family Medicine

## 2020-10-14 ENCOUNTER — Other Ambulatory Visit: Payer: Self-pay | Admitting: Family Medicine

## 2020-10-14 DIAGNOSIS — G4709 Other insomnia: Secondary | ICD-10-CM

## 2020-10-14 NOTE — Telephone Encounter (Signed)
Requested medication (s) are due for refill today: yes  Requested medication (s) are on the active medication list: yes  Last refill:  08/13/20 #30 1 refill  Future visit scheduled: yes in 5 days   Notes to clinic:  not delegated per protocol; last office visit 8 months ago      Requested Prescriptions  Pending Prescriptions Disp Refills   zolpidem (AMBIEN) 5 MG tablet [Pharmacy Med Name: Zolpidem Tartrate 5 MG Oral Tablet] 30 tablet 0    Sig: TAKE 1 TABLET BY MOUTH AT BEDTIME AS NEEDED FOR SLEEP      Not Delegated - Psychiatry:  Anxiolytics/Hypnotics Failed - 10/14/2020  2:11 PM      Failed - This refill cannot be delegated      Failed - Urine Drug Screen completed in last 360 days      Failed - Valid encounter within last 6 months    Recent Outpatient Visits           8 months ago Type 2 diabetes mellitus with other neurologic complication, without long-term current use of insulin ([place])   [place], [place], MD   11 months ago Type 2 diabetes mellitus with other neurologic complication, without long-term current use of insulin ([place])   [facility] [place], Charlane Ferretti, MD   1 year ago Spasm   [place], [place], MD   1 year ago Essential hypertension   [place], Charlane Ferretti, MD   1 year ago Type 2 diabetes mellitus with hyperglycemia, without long-term current use of insulin ([place])   [place], [place], MD       Future Appointments             In 5 days Charlott Rakes, MD [place]

## 2020-10-19 ENCOUNTER — Ambulatory Visit: Payer: Medicaid Other | Attending: Family Medicine | Admitting: Family Medicine

## 2020-10-19 ENCOUNTER — Other Ambulatory Visit: Payer: Self-pay

## 2020-10-19 ENCOUNTER — Encounter: Payer: Self-pay | Admitting: Family Medicine

## 2020-10-19 VITALS — BP 123/80 | HR 111 | Temp 98.4°F | Ht 64.0 in | Wt 175.2 lb

## 2020-10-19 DIAGNOSIS — M5441 Lumbago with sciatica, right side: Secondary | ICD-10-CM | POA: Insufficient documentation

## 2020-10-19 DIAGNOSIS — M25511 Pain in right shoulder: Secondary | ICD-10-CM

## 2020-10-19 DIAGNOSIS — Z79899 Other long term (current) drug therapy: Secondary | ICD-10-CM | POA: Insufficient documentation

## 2020-10-19 DIAGNOSIS — G8929 Other chronic pain: Secondary | ICD-10-CM | POA: Diagnosis not present

## 2020-10-19 DIAGNOSIS — E1169 Type 2 diabetes mellitus with other specified complication: Secondary | ICD-10-CM

## 2020-10-19 DIAGNOSIS — G4709 Other insomnia: Secondary | ICD-10-CM | POA: Insufficient documentation

## 2020-10-19 DIAGNOSIS — J302 Other seasonal allergic rhinitis: Secondary | ICD-10-CM

## 2020-10-19 DIAGNOSIS — R110 Nausea: Secondary | ICD-10-CM

## 2020-10-19 DIAGNOSIS — E1149 Type 2 diabetes mellitus with other diabetic neurological complication: Secondary | ICD-10-CM

## 2020-10-19 DIAGNOSIS — I10 Essential (primary) hypertension: Secondary | ICD-10-CM | POA: Insufficient documentation

## 2020-10-19 DIAGNOSIS — G43809 Other migraine, not intractable, without status migrainosus: Secondary | ICD-10-CM | POA: Insufficient documentation

## 2020-10-19 DIAGNOSIS — K2100 Gastro-esophageal reflux disease with esophagitis, without bleeding: Secondary | ICD-10-CM | POA: Diagnosis not present

## 2020-10-19 DIAGNOSIS — I152 Hypertension secondary to endocrine disorders: Secondary | ICD-10-CM

## 2020-10-19 DIAGNOSIS — M25551 Pain in right hip: Secondary | ICD-10-CM | POA: Insufficient documentation

## 2020-10-19 DIAGNOSIS — F319 Bipolar disorder, unspecified: Secondary | ICD-10-CM | POA: Diagnosis not present

## 2020-10-19 DIAGNOSIS — Z1231 Encounter for screening mammogram for malignant neoplasm of breast: Secondary | ICD-10-CM

## 2020-10-19 DIAGNOSIS — Z7984 Long term (current) use of oral hypoglycemic drugs: Secondary | ICD-10-CM | POA: Diagnosis not present

## 2020-10-19 DIAGNOSIS — E785 Hyperlipidemia, unspecified: Secondary | ICD-10-CM

## 2020-10-19 DIAGNOSIS — E1159 Type 2 diabetes mellitus with other circulatory complications: Secondary | ICD-10-CM

## 2020-10-19 LAB — POCT GLYCOSYLATED HEMOGLOBIN (HGB A1C): HbA1c, POC (controlled diabetic range): 7 % (ref 0.0–7.0)

## 2020-10-19 LAB — GLUCOSE, POCT (MANUAL RESULT ENTRY): POC Glucose: 151 mg/dl — AB (ref 70–99)

## 2020-10-19 MED ORDER — METFORMIN HCL 500 MG PO TABS: 1000.0000 mg | ORAL_TABLET | Freq: Two times a day (BID) | ORAL | 1 refills | Status: AC

## 2020-10-19 MED ORDER — PROMETHAZINE HCL 25 MG PO TABS: 25.0000 mg | ORAL_TABLET | Freq: Three times a day (TID) | ORAL | 0 refills | Status: DC | PRN

## 2020-10-19 MED ORDER — GABAPENTIN 300 MG PO CAPS: 300.0000 mg | ORAL_CAPSULE | Freq: Two times a day (BID) | ORAL | 6 refills | Status: DC

## 2020-10-19 MED ORDER — TOPIRAMATE 50 MG PO TABS: 50.0000 mg | ORAL_TABLET | Freq: Two times a day (BID) | ORAL | 3 refills | Status: DC

## 2020-10-19 MED ORDER — GLIPIZIDE 5 MG PO TABS: 2.5000 mg | ORAL_TABLET | Freq: Two times a day (BID) | ORAL | 6 refills | Status: AC

## 2020-10-19 MED ORDER — MELOXICAM 7.5 MG PO TABS: 7.5000 mg | ORAL_TABLET | Freq: Every day | ORAL | 1 refills | Status: DC

## 2020-10-19 MED ORDER — BUTALBITAL-APAP-CAFFEINE 50-325-40 MG PO TABS: 1.0000 | ORAL_TABLET | Freq: Four times a day (QID) | ORAL | 0 refills | Status: DC | PRN

## 2020-10-19 MED ORDER — PANTOPRAZOLE SODIUM 40 MG PO TBEC: 40.0000 mg | DELAYED_RELEASE_TABLET | Freq: Every day | ORAL | 6 refills | Status: AC

## 2020-10-19 MED ORDER — ZOLPIDEM TARTRATE 5 MG PO TABS: 5.0000 mg | ORAL_TABLET | Freq: Every evening | ORAL | 2 refills | Status: DC | PRN

## 2020-10-19 MED ORDER — LOSARTAN POTASSIUM-HCTZ 50-12.5 MG PO TABS: 1.0000 | ORAL_TABLET | Freq: Every day | ORAL | 6 refills | Status: DC

## 2020-10-19 MED ORDER — ATORVASTATIN CALCIUM 80 MG PO TABS: 80.0000 mg | ORAL_TABLET | Freq: Every day | ORAL | 1 refills | Status: DC

## 2020-10-19 NOTE — Progress Notes (Signed)
Subjective:  Patient ID: Paige Andrews, female    DOB: 1956/07/03  Age: 64 y.o. MRN: [id number]  CC: Diabetes and Hypertension   HPI Arietta Eisenstein is a 64year old female with a history of type 2 diabetes mellitus (A1c7.0) IBS, previous alcohol abuse, pancreatitis, asthma, GERD, Bipolar disorder, anxiety and depression who presents for a follow-up visit. Her L shoulder hurts with pain rated as a 6/10 and sometimes gets worse on the right.  She completed PT in 04/2020.  Seen by Orthopedics - Dr Erlinda Hong deviously. R leg gives out intermittently. She has pain in her R hip anteriorly and lower back pain which is chronic. Complains of pins and needles in feet and symptoms go from a 3 to 10/10.  She has gabapentin on her medication list however she has not been compliant with it.  Would like a referral to pain management as pain interferes with her sleep. For Bipolar she is followed by Psych and is on Effexor  Her diabetes is controlled on her current regimen.  She continues to complain of nausea and is requesting refill of promethazine.  She is also on a PPI. Also complains of sniffles, rhinorrhea which are typical of her allergies and would like to be referred to an allergist.  Also has headaches and is requesting refill of her Fioricet Past Medical History:  Diagnosis Date   Anxiety    Bipolar 1 disorder ([place])    Celiac disease    Depression    Hypertension    IBS (irritable bowel syndrome)     Past Surgical History:  Procedure Laterality Date   carpel tunnel Right    CESAREAN SECTION     SHOULDER SURGERY Right     Family History  Problem Relation Age of Onset   Dementia Mother    Breast cancer Mother    Pancreatitis Father    Dementia Father     Allergies  Allergen Reactions   Morphine And Related Anxiety    Patient states, " it makes me  feel like I am crawling out of my skin"    Outpatient Medications Prior to Visit  Medication Sig Dispense Refill    ACCU-CHEK SOFTCLIX LANCETS lancets Use as instructed to test sugar once daily 100 each 12   aluminum chloride (DRYSOL) 20 % external solution Apply topically at bedtime. 35 mL 1   baclofen (LIORESAL) 10 MG tablet Take 1 tablet (10 mg total) by mouth 3 (three) times daily. 90 tablet 6   Blood Glucose Monitoring Suppl (ACCU-CHEK AVIVA PLUS) w/Device KIT Use as instructed to test sugar once daily 1 kit 0   cetirizine (ZYRTEC) 10 MG tablet Take 1 tablet (10 mg total) by mouth daily. 30 tablet 1   glucose blood (ACCU-CHEK AVIVA PLUS) test strip Use as instructed to test sugar once daily 100 each 12   Lancets Misc. (ACCU-CHEK FASTCLIX LANCET) KIT Use as directed once daily 1 kit 0   atorvastatin (LIPITOR) 80 MG tablet Take 1 tablet (80 mg total) by mouth daily. 90 tablet 1   famotidine (PEPCID) 20 MG tablet Take 1 tablet (20 mg total) by mouth 2 (two) times daily. 30 tablet 0   glipiZIDE (GLUCOTROL) 5 MG tablet Take 0.5 tablets (2.5 mg total) by mouth 2 (two) times daily before a meal. 30 tablet 6   hydrOXYzine (ATARAX/VISTARIL) 25 MG tablet TAKE 1 TABLET BY MOUTH 3 TIMES DAILY AS NEEDED. 60 tablet 2   losartan-hydrochlorothiazide (HYZAAR) 50-12.5 MG tablet Take 1 tablet by  mouth daily. 30 tablet 6   metFORMIN (GLUCOPHAGE) 500 MG tablet Take 2 tablets (1,000 mg total) by mouth 2 (two) times daily with a meal. 360 tablet 1   pantoprazole (PROTONIX) 40 MG tablet Take 1 tablet (40 mg total) by mouth daily. 60 tablet 6   promethazine (PHENERGAN) 25 MG tablet Take 1 tablet (25 mg total) by mouth every 6 (six) hours as needed for nausea or vomiting. 10 tablet 0   topiramate (TOPAMAX) 50 MG tablet Take 1 tablet (50 mg total) by mouth 2 (two) times daily. 60 tablet 3   zolpidem (AMBIEN) 5 MG tablet TAKE 1 TABLET BY MOUTH AT BEDTIME AS NEEDED FOR SLEEP 30 tablet 0   busPIRone (BUSPAR) 10 MG tablet Take 10 mg by mouth 3 (three) times daily. (Patient not taking: Reported on 10/19/2020)      lurasidone (LATUDA) 40 MG TABS tablet Take 40 mg by mouth daily with breakfast. (Patient not taking: Reported on 10/19/2020)     sucralfate (CARAFATE) 1 g tablet Take 1 tablet (1 g total) by mouth 4 (four) times daily -  with meals and at bedtime. (Patient not taking: Reported on 10/19/2020) 60 tablet 0   butalbital-acetaminophen-caffeine (FIORICET) 50-325-40 MG tablet Take 1 tablet by mouth every 6 (six) hours as needed for headache. (Patient not taking: Reported on 10/19/2020) 20 tablet 0   DULoxetine (CYMBALTA) 60 MG capsule Take 1 capsule (60 mg total) by mouth daily. (Patient not taking: Reported on 10/19/2020) 30 capsule 3   gabapentin (NEURONTIN) 300 MG capsule Take 1 capsule (300 mg total) by mouth 2 (two) times daily. (Patient not taking: Reported on 10/19/2020) 60 capsule 6   meloxicam (MOBIC) 7.5 MG tablet Take 1 tablet (7.5 mg total) by mouth daily. (Patient not taking: Reported on 10/19/2020) 30 tablet 1   ondansetron (ZOFRAN) 8 MG tablet Take 1 tablet (8 mg total) by mouth every 8 (eight) hours as needed for nausea or vomiting. (Patient not taking: Reported on 10/19/2020) 20 tablet 0   oxyCODONE (OXY IR/ROXICODONE) 5 MG immediate release tablet Take 1 tablet (5 mg total) by mouth every 6 (six) hours as needed for severe pain. (Patient not taking: Reported on 10/19/2020) 6 tablet 0   No facility-administered medications prior to visit.     ROS Review of Systems  Constitutional: Negative for activity change, appetite change and fatigue.  HENT: Negative for congestion, sinus pressure and sore throat.   Eyes: Negative for visual disturbance.  Respiratory: Negative for cough, chest tightness, shortness of breath and wheezing.   Cardiovascular: Negative for chest pain and palpitations.  Gastrointestinal: Positive for nausea. Negative for abdominal distention, abdominal pain and constipation.  Endocrine: Negative for polydipsia.  Genitourinary: Negative for dysuria and frequency.    Musculoskeletal:       See HPI  Skin: Negative for rash.  Neurological: Negative for tremors, light-headedness and numbness.  Hematological: Does not bruise/bleed easily.  Psychiatric/Behavioral: Positive for sleep disturbance. Negative for agitation and behavioral problems.    Objective:  BP 123/80    Pulse (!) 111    Temp 98.4 F (36.9 C) (Oral)    Ht _0  (1.626 m)    Wt 175 lb 3.2 oz (79.5 kg)    LMP 12/11/2014 (Approximate)    SpO2 96%    BMI 30.07 kg/m   BP/Weight 10/19/2020 06/04/2020 08/03/2352  Systolic BP 614 431 540  Diastolic BP 80 85 91  Wt. (Lbs) 175.2 180.78 180  BMI 30.07 31.03 30.9  Physical Exam Constitutional:      Appearance: She is well-developed.  Neck:     Vascular: No JVD.  Cardiovascular:     Rate and Rhythm: Normal rate.     Heart sounds: Normal heart sounds. No murmur heard.   Pulmonary:     Effort: Pulmonary effort is normal.     Breath sounds: Normal breath sounds. No wheezing or rales.  Chest:     Chest wall: No tenderness.  Abdominal:     General: Bowel sounds are normal. There is no distension.     Palpations: Abdomen is soft. There is no mass.     Tenderness: There is no abdominal tenderness.  Musculoskeletal:        General: Normal range of motion.     Right lower leg: No edema.     Left lower leg: No edema.     Comments: No tenderness on palpation of back Negative straight leg raise bilaterally Full range of motion in bilateral upper extremities No tenderness on palpation of left shoulder muscle Normal handgrip  Neurological:     Mental Status: She is alert and oriented to person, place, and time.  Psychiatric:        Mood and Affect: Mood normal.     CMP Latest Ref Rng & Units 06/04/2020 05/27/2020 02/02/2020  Glucose 70 - 99 mg/dL 327(H) 216(H) 138(H)  BUN 8 - 23 mg/dL _0 Creatinine 0.44 - 1.00 mg/dL 0.95 0.86 0.94  Sodium 135 - 145 mmol/L 135 138 143  Potassium 3.5 - 5.1 mmol/L 3.5 3.5 5.0  Chloride 98 - 111  mmol/L 98 103 108(H)  CO2 22 - 32 mmol/L 30 21(L) 22  Calcium 8.9 - 10.3 mg/dL 9.3 10.1 10.5(H)  Total Protein 6.5 - 8.1 g/dL 7.0 7.4 -  Total Bilirubin 0.3 - 1.2 mg/dL 1.3(H) 1.1 -  Alkaline Phos 38 - 126 U/L 63 76 -  AST 15 - 41 U/L 30 39 -  ALT 0 - 44 U/L 41 66(H) -    Lipid Panel     Component Value Date/Time   CHOL 136 02/02/2020 0945   TRIG 78 02/02/2020 0945   HDL 54 02/02/2020 0945   CHOLHDL 2.5 02/02/2020 0945   LDLCALC 67 02/02/2020 0945    CBC    Component Value Date/Time   WBC 8.0 06/04/2020 1023   RBC 4.64 06/04/2020 1023   HGB 14.6 06/04/2020 1023   HCT 42.3 06/04/2020 1023   PLT 256 06/04/2020 1023   MCV 91.2 06/04/2020 1023   MCH 31.5 06/04/2020 1023   MCHC 34.5 06/04/2020 1023   RDW 12.3 06/04/2020 1023   LYMPHSABS 2.6 04/01/2018 0257   MONOABS 0.9 04/01/2018 0257   EOSABS 0.3 04/01/2018 0257   BASOSABS 0.1 04/01/2018 0257    Lab Results  Component Value Date   HGBA1C 7.0 10/19/2020    Assessment & Plan:  1. Type 2 diabetes mellitus with other neurologic complication, without long-term current use of insulin (HCC) Controlled with A1c of 7.0 Continue current management Counseled on Diabetic diet, my plate method, 644 minutes of moderate intensity exercise/week Blood sugar logs with fasting goals of 80-120 mg/dl, random of less than 180 and in the event of sugars less than 60 mg/dl or greater than 400 mg/dl encouraged to notify the clinic. Advised on the need for annual eye exams, annual foot exams, Pneumonia vaccine. - POCT glucose (manual entry) - POCT glycosylated hemoglobin (Hb A1C) - CMP14+EGFR - metFORMIN (GLUCOPHAGE) 500  MG tablet; Take 2 tablets (1,000 mg total) by mouth 2 (two) times daily with a meal.  Dispense: 360 tablet; Refill: 1 - glipiZIDE (GLUCOTROL) 5 MG tablet; Take 0.5 tablets (2.5 mg total) by mouth 2 (two) times daily before a meal.  Dispense: 30 tablet; Refill: 6 - atorvastatin (LIPITOR) 80 MG tablet; Take 1 tablet (80 mg  total) by mouth daily.  Dispense: 90 tablet; Refill: 1  2. Chronic right-sided low back pain with right-sided sciatica Uncontrolled Refilled gabapentin and meloxicam which she has not been taking Referral to pain management as per request - gabapentin (NEURONTIN) 300 MG capsule; Take 1 capsule (300 mg total) by mouth 2 (two) times daily.  Dispense: 60 capsule; Refill: 6 - meloxicam (MOBIC) 7.5 MG tablet; Take 1 tablet (7.5 mg total) by mouth daily.  Dispense: 30 tablet; Refill: 1 - Ambulatory referral to Pain Clinic  3. Other migraine without status migrainosus, not intractable Uncontrolled She has been out of Topamax and Fioricet which I have refilled - butalbital-acetaminophen-caffeine (FIORICET) 50-325-40 MG tablet; Take 1 tablet by mouth every 6 (six) hours as needed for headache.  Dispense: 20 tablet; Refill: 0 - topiramate (TOPAMAX) 50 MG tablet; Take 1 tablet (50 mg total) by mouth 2 (two) times daily.  Dispense: 60 tablet; Refill: 3  4. Other insomnia Uncontrolled due to pain We will attempt to control pain but will continue on current dose of Ambien - zolpidem (AMBIEN) 5 MG tablet; Take 1 tablet (5 mg total) by mouth at bedtime as needed. for sleep  Dispense: 30 tablet; Refill: 2  5. Chronic right shoulder pain Uncontrolled despite completion of PT We have provided the number to Ortho care for her to schedule an appointment with her orthopedics - meloxicam (MOBIC) 7.5 MG tablet; Take 1 tablet (7.5 mg total) by mouth daily.  Dispense: 30 tablet; Refill: 1 - Ambulatory referral to Pain Clinic  6. Hypertension complicating diabetes ([place]) Controlled Counseled on blood pressure goal of less than 130/80, low-sodium, DASH diet, medication compliance, 150 minutes of moderate intensity exercise per week. Discussed medication compliance, adverse effects. - losartan-hydrochlorothiazide (HYZAAR) 50-12.5 MG tablet; Take 1 tablet by mouth daily.  Dispense: 30 tablet; Refill: 6  7.  Hyperlipidemia associated with type 2 diabetes mellitus (HCC) Controlled Low-cholesterol diet - atorvastatin (LIPITOR) 80 MG tablet; Take 1 tablet (80 mg total) by mouth daily.  Dispense: 90 tablet; Refill: 1  8. Seasonal allergies Uncontrolled Would love to place on nasal spray and optimize management however she insists on an allergy referral and referral has been placed - Ambulatory referral to Allergy  9. Right hip pain Possibly underlying osteoarthritis with superimposed sciatica - meloxicam (MOBIC) 7.5 MG tablet; Take 1 tablet (7.5 mg total) by mouth daily.  Dispense: 30 tablet; Refill: 1 - XR HIPS BILAT W OR W/O PELVIS 3-4 VIEWS  10. Bipolar 1 disorder ([place]) Management as per psych  11. Nausea Could be secondary to GERD versus gastroparesis - promethazine (PHENERGAN) 25 MG tablet; Take 1 tablet (25 mg total) by mouth every 8 (eight) hours as needed for nausea or vomiting.  Dispense: 20 tablet; Refill: 0  12. Gastroesophageal reflux disease with esophagitis without hemorrhage Stable - pantoprazole (PROTONIX) 40 MG tablet; Take 1 tablet (40 mg total) by mouth daily.  Dispense: 60 tablet; Refill: 6  13. Encounter for screening mammogram for malignant neoplasm of breast b- MM 3D SCREEN BREAST BILATERAL; Future  Health Care Maintenance: Referred for colonoscopy however she refused service per referral notes. Meds  ordered this encounter  Medications   gabapentin (NEURONTIN) 300 MG capsule    Sig: Take 1 capsule (300 mg total) by mouth 2 (two) times daily.    Dispense:  60 capsule    Refill:  6   butalbital-acetaminophen-caffeine (FIORICET) 50-325-40 MG tablet    Sig: Take 1 tablet by mouth every 6 (six) hours as needed for headache.    Dispense:  20 tablet    Refill:  0   zolpidem (AMBIEN) 5 MG tablet    Sig: Take 1 tablet (5 mg total) by mouth at bedtime as needed. for sleep    Dispense:  30 tablet    Refill:  2    Refill after 11/09/20   topiramate (TOPAMAX) 50 MG  tablet    Sig: Take 1 tablet (50 mg total) by mouth 2 (two) times daily.    Dispense:  60 tablet    Refill:  3   promethazine (PHENERGAN) 25 MG tablet    Sig: Take 1 tablet (25 mg total) by mouth every 8 (eight) hours as needed for nausea or vomiting.    Dispense:  20 tablet    Refill:  0   metFORMIN (GLUCOPHAGE) 500 MG tablet    Sig: Take 2 tablets (1,000 mg total) by mouth 2 (two) times daily with a meal.    Dispense:  360 tablet    Refill:  1   meloxicam (MOBIC) 7.5 MG tablet    Sig: Take 1 tablet (7.5 mg total) by mouth daily.    Dispense:  30 tablet    Refill:  1   losartan-hydrochlorothiazide (HYZAAR) 50-12.5 MG tablet    Sig: Take 1 tablet by mouth daily.    Dispense:  30 tablet    Refill:  6   glipiZIDE (GLUCOTROL) 5 MG tablet    Sig: Take 0.5 tablets (2.5 mg total) by mouth 2 (two) times daily before a meal.    Dispense:  30 tablet    Refill:  6   atorvastatin (LIPITOR) 80 MG tablet    Sig: Take 1 tablet (80 mg total) by mouth daily.    Dispense:  90 tablet    Refill:  1   pantoprazole (PROTONIX) 40 MG tablet    Sig: Take 1 tablet (40 mg total) by mouth daily.    Dispense:  60 tablet    Refill:  6    Follow-up: Return in about 6 months (around 04/18/2021) for Chronic disease management.       Charlott Rakes, MD, FAAFP. [facility], [place]   10/19/2020, 1:25 PM

## 2020-10-19 NOTE — Progress Notes (Signed)
Having trouble with allergies.  Having pain in shoulders and legs.  Needs refills sent to [facility].

## 2020-10-20 LAB — CMP14+EGFR
ALT: 42 IU/L — ABNORMAL HIGH (ref 0–32)
AST: 35 IU/L (ref 0–40)
Albumin/Globulin Ratio: 1.8 (ref 1.2–2.2)
Albumin: 4.7 g/dL (ref 3.8–4.8)
Alkaline Phosphatase: 99 IU/L (ref 44–121)
BUN/Creatinine Ratio: 8 — ABNORMAL LOW (ref 12–28)
BUN: 8 mg/dL (ref 8–27)
Bilirubin Total: 1.1 mg/dL (ref 0.0–1.2)
CO2: 21 mmol/L (ref 20–29)
Calcium: 10.6 mg/dL — ABNORMAL HIGH (ref 8.7–10.3)
Chloride: 89 mmol/L — ABNORMAL LOW (ref 96–106)
Creatinine, Ser: 1 mg/dL (ref 0.57–1.00)
GFR calc Af Amer: 69 mL/min/{1.73_m2} (ref >59)
GFR calc non Af Amer: 60 mL/min/{1.73_m2} (ref >59)
Globulin, Total: 2.6 g/dL (ref 1.5–4.5)
Glucose: 131 mg/dL — ABNORMAL HIGH (ref 65–99)
Potassium: 4.2 mmol/L (ref 3.5–5.2)
Sodium: 126 mmol/L — ABNORMAL LOW (ref 134–144)
Total Protein: 7.3 g/dL (ref 6.0–8.5)

## 2020-10-22 ENCOUNTER — Telehealth: Payer: Self-pay

## 2020-10-22 NOTE — Telephone Encounter (Signed)
-----   Message from Charlott Rakes, MD sent at 10/20/2020 12:35 PM EST ----- Please inform her that her sodium level is low; please advise to slightly increase sodium intake as this could be from one of her Psych meds. Other labs are stable.

## 2020-10-22 NOTE — Telephone Encounter (Signed)
Patient name and DOB has been verified Patient was informed of lab results. Patient had no questions.  

## 2020-11-10 MED FILL — hydrOXYzine HCL 50 MG TABS: 50 | 30 days supply | Qty: 90 | Fill #0

## 2020-11-15 ENCOUNTER — Other Ambulatory Visit: Payer: Self-pay

## 2020-11-15 ENCOUNTER — Ambulatory Visit (HOSPITAL_BASED_OUTPATIENT_CLINIC_OR_DEPARTMENT_OTHER)
Admission: RE | Admit: 2020-11-15 | Discharge: 2020-11-15 | Disposition: A | Discharge status: Home / Self Care | Payer: Medicaid Other | Source: Ambulatory Visit | Attending: Family Medicine | Admitting: Family Medicine

## 2020-11-15 DIAGNOSIS — M25551 Pain in right hip: Secondary | ICD-10-CM

## 2020-11-16 ENCOUNTER — Telehealth: Payer: Self-pay

## 2020-11-16 NOTE — Telephone Encounter (Signed)
Patient name and DOB has been verified Patient was informed of lab results. Patient had no questions.  

## 2020-11-16 NOTE — Telephone Encounter (Signed)
-----   Message from Charlott Rakes, MD sent at 11/16/2020  8:49 AM EST ----- Xrays of hips reveal presence of arthritis in the right greater than left.

## 2020-12-08 ENCOUNTER — Ambulatory Visit: Payer: Medicaid Other | Admitting: Allergy

## 2021-01-12 ENCOUNTER — Other Ambulatory Visit: Payer: Self-pay | Admitting: Family Medicine

## 2021-01-12 DIAGNOSIS — G8929 Other chronic pain: Secondary | ICD-10-CM

## 2021-01-12 NOTE — Telephone Encounter (Signed)
Requested medication (s) are due for refill today: yes  Requested medication (s) are on the active medication list: yes  Last refill:  12/14/2020  Future visit scheduled: no  Notes to clinic: this refill cannot be delegated    Requested Prescriptions  Pending Prescriptions Disp Refills   baclofen (LIORESAL) 10 MG tablet [Pharmacy Med Name: Baclofen 10 MG Oral Tablet] 90 tablet 0    Sig: TAKE 1 TABLET BY MOUTH THREE TIMES DAILY      Not Delegated - Analgesics:  Muscle Relaxants Failed - 01/12/2021  9:49 AM      Failed - This refill cannot be delegated      Passed - Valid encounter within last 6 months    Recent Outpatient Visits           2 months ago Type 2 diabetes mellitus with other neurologic complication, without long-term current use of insulin ([place])   [place], [place], MD   11 months ago Type 2 diabetes mellitus with other neurologic complication, without long-term current use of insulin ([place])   [place], [place], MD   1 year ago Type 2 diabetes mellitus with other neurologic complication, without long-term current use of insulin ([place])   [place], Charlane Ferretti, MD   1 year ago Spasm   [place], [place], MD   1 year ago Essential hypertension   [facility] Charlott Rakes, MD

## 2021-01-21 ENCOUNTER — Encounter: Payer: Self-pay | Admitting: Allergy

## 2021-01-21 ENCOUNTER — Other Ambulatory Visit: Payer: Self-pay

## 2021-01-21 ENCOUNTER — Ambulatory Visit (INDEPENDENT_AMBULATORY_CARE_PROVIDER_SITE_OTHER): Payer: Medicaid Other | Admitting: Allergy

## 2021-01-21 VITALS — BP 126/82 | HR 116 | Temp 97.2°F | Resp 22 | Ht 64.0 in | Wt 173.4 lb

## 2021-01-21 DIAGNOSIS — H6983 Other specified disorders of Eustachian tube, bilateral: Secondary | ICD-10-CM | POA: Diagnosis not present

## 2021-01-21 DIAGNOSIS — J4520 Mild intermittent asthma, uncomplicated: Secondary | ICD-10-CM

## 2021-01-21 DIAGNOSIS — J302 Other seasonal allergic rhinitis: Secondary | ICD-10-CM

## 2021-01-21 DIAGNOSIS — J3089 Other allergic rhinitis: Secondary | ICD-10-CM

## 2021-01-21 NOTE — Progress Notes (Signed)
New Patient Note  RE: Paige Andrews MRN: [id number] DOB: 08-16-1956 Date of Office Visit: 01/21/2021  Referring provider: Charlott Rakes, MD Primary care provider: Charlott Rakes, MD  Chief Complaint: Asthma and allergic rhinitis  History of present illness: Paige Andrews is a 65 y.o. female presenting today for consultation for allergic rhinitis and asthma.  At today's visit she reports that she has had symptoms of allergic rhinitis that began as a child and occur year-round.  Symptoms include nasal congestion, clear rhinorrhea, cough which is a mix of dry and producing clear mucus, and thick postnasal drainage.  She reports that she has tried Benadryl, Sudafed, cetirizine, Claritin, and Flonase with no relief of symptoms.  She does have a cat in her house and she reports her symptoms are worse in the presence of animals.  She reports a " clogged" feeling in her ears and her hearing is frequently "muffled" about 9 months out of the year.  She does report that she does not hearing any popping or fluid shifting however, her hearing is muffled for about 9 months of the year.  She does report that she had her hearing checked by an audiologist and was determined to be borderline hard of hearing.  She denies ear pain or drainage.  She has recently completed a sleep study and is on a waiting list to receive a CPAP machine.  Asthma is reported as moderately well controlled with symptoms occurring in intermittent pattern.  She reports some shortness of breath occurring occasionally when climbing stairs, occasional wheeze with vigorous activity, and intermittent cough which is dry and producing mucus.  She reports using albuterol about once a month with resolution of symptoms.  Reflux is reported as intermittent with episodes occurring several days at a time and then not occurring for several months. She continues pantoprazole 40 mg once a day.     Review of systems: Review of Systems  Constitutional:  Negative.   HENT: Positive for congestion and hearing loss.        Clear rhinorrhea Postnasal drainage  Eyes: Negative.   Respiratory:       Occasional shortness of breath  Cardiovascular: Negative.   Gastrointestinal: Negative.   Musculoskeletal: Negative.   Skin: Negative.   Neurological: Negative.   Endo/Heme/Allergies: Positive for environmental allergies.  Psychiatric/Behavioral: Negative.     All other systems negative unless noted above in HPI  Past medical history: Past Medical History:  Diagnosis Date  . Anxiety   . Asthma   . Bipolar 1 disorder ([place])   . Celiac disease   . Depression   . Hypertension   . IBS (irritable bowel syndrome)     Past surgical history: Past Surgical History:  Procedure Laterality Date  . carpel tunnel Right   . CESAREAN SECTION    . SHOULDER SURGERY Right     Family history:  Family History  Problem Relation Age of Onset  . Dementia Mother   . Breast cancer Mother   . Pancreatitis Father   . Dementia Father   . COPD Father   . Multiple sclerosis Sister   . Multiple sclerosis Brother   . Allergic rhinitis Brother     Social history: Taleigha lives in a 65 year old townhouse with no concern for water damage or mildew in the house.  Flooring is a combination of synthetic wood and carpet.  Heating is gas and cooling is heat pump.  There is a cat located inside the home who is allowed into  the bedroom.  There is no concern for roaches in the home and the bed is at least 2 feet off the floor.  There are no dust mite free covers on the bed or pillows.  She is not exposed to tobacco smoke in the home or car.  She is currently retired.  There is no concern for chemical, dust, or fumes at her home or during activities or hobbies.  Her home is not located near an interstate or industrial area.  She does not use a HEPA filter in her home.  She does not have a history of smoking or vaping.  Medication List: Current Outpatient Medications   Medication Sig Dispense Refill  . ACCU-CHEK SOFTCLIX LANCETS lancets Use as instructed to test sugar once daily 100 each 12  . aluminum chloride (DRYSOL) 20 % external solution Apply topically at bedtime. 35 mL 1  . atorvastatin (LIPITOR) 80 MG tablet Take 1 tablet (80 mg total) by mouth daily. 90 tablet 1  . baclofen (LIORESAL) 10 MG tablet TAKE 1 TABLET BY MOUTH THREE TIMES DAILY 90 tablet 0  . Blood Glucose Monitoring Suppl (ACCU-CHEK AVIVA PLUS) w/Device KIT Use as instructed to test sugar once daily 1 kit 0  . butalbital-acetaminophen-caffeine (FIORICET) 50-325-40 MG tablet Take 1 tablet by mouth every 6 (six) hours as needed for headache. 20 tablet 0  . calcium citrate-vitamin D (CITRACAL+D) 315-200 MG-UNIT tablet Take 1 tablet by mouth daily.    . diphenhydrAMINE (BENADRYL) 25 MG tablet Take 25 mg by mouth every 6 (six) hours as needed.    . gabapentin (NEURONTIN) 300 MG capsule Take 1 capsule (300 mg total) by mouth 2 (two) times daily. 60 capsule 6  . glipiZIDE (GLUCOTROL) 5 MG tablet Take 0.5 tablets (2.5 mg total) by mouth 2 (two) times daily before a meal. 30 tablet 6  . glucose blood (ACCU-CHEK AVIVA PLUS) test strip Use as instructed to test sugar once daily 100 each 12  . hydrOXYzine (ATARAX/VISTARIL) 50 MG tablet Take 50 mg by mouth 3 (three) times daily as needed.    Marland Kitchen ibuprofen (ADVIL) 200 MG tablet Take 400 mg by mouth 3 (three) times daily.    . Lancets Misc. (ACCU-CHEK FASTCLIX LANCET) KIT Use as directed once daily 1 kit 0  . lisdexamfetamine (VYVANSE) 50 MG capsule Take 50 mg by mouth daily.    Marland Kitchen losartan-hydrochlorothiazide (HYZAAR) 50-12.5 MG tablet Take 1 tablet by mouth daily. 30 tablet 6  . magnesium oxide (MAG-OX) 400 MG tablet Take 400 mg by mouth daily.    . meclizine (ANTIVERT) 25 MG tablet Take 25 mg by mouth 3 (three) times daily as needed for dizziness.    . metFORMIN (GLUCOPHAGE) 500 MG tablet Take 2 tablets (1,000 mg total) by mouth 2 (two) times daily with a  meal. 360 tablet 1  . montelukast (SINGULAIR) 10 MG tablet Take 10 mg by mouth at bedtime.    Marland Kitchen oxyCODONE-acetaminophen (ROXICET) 5-325 MG/5ML solution Take 5 mLs by mouth every 4 (four) hours as needed for severe pain.    . pantoprazole (PROTONIX) 40 MG tablet Take 1 tablet (40 mg total) by mouth daily. 60 tablet 6  . promethazine (PHENERGAN) 25 MG tablet Take 1 tablet (25 mg total) by mouth every 8 (eight) hours as needed for nausea or vomiting. 20 tablet 0  . zolpidem (AMBIEN) 5 MG tablet Take 1 tablet (5 mg total) by mouth at bedtime as needed. for sleep 30 tablet 2   No current  facility-administered medications for this visit.    Known medication allergies: Allergies  Allergen Reactions  . Morphine And Related Anxiety    Patient states, " it makes me  feel like I am crawling out of my skin"     Physical examination: Blood pressure 126/82, pulse (!) 116, temperature (!) 97.2 F (36.2 C), temperature source Temporal, resp. rate (!) 22, height 5' 4"  (1.626 m), weight 173 lb 6.4 oz (78.7 kg), last menstrual period 12/11/2014, SpO2 98 %.  General: Alert, interactive, in no acute distress. HEENT: TMs pearly gray, turbinates edematous and pale with clear discharge, post-pharynx mildly erythematous. Neck: Supple without lymphadenopathy. Lungs: Clear to auscultation without wheezing, rhonchi or rales. {no increased work of breathing. CV: Normal S1, S2 without murmurs. Abdomen: Nondistended, nontender. Skin: Warm and dry, without lesions or rashes. Extremities:  No clubbing, cyanosis or edema. Neuro:   Grossly intact.  Diagnositics/Labs:  Spirometry: FEV1: 104, FVC: 96, ratio consistent with Normal ventilatory function  Allergy testing: Percutaneous environmental skin testing was negative with adequate controls.  Intradermal environmental skin testing was positive to grass mix, cat hair, dog epithelia, and mite mix with adequate control.  Allergy testing results were read and  interpreted by provider, documented by clinical staff.   Assessment and plan: Patient Instructions  Allergic rhinitis Your skin testing at today's visit was positive to grass pollen, dust mite, cat, and dog Allergen avoidance measures are provided below Begin montelukast 10 mg once a day for better control of allergy symptoms.   Begin levocetirizine 5 mg once a day as needed for a runny nose. Remember to rotate to a different antihistamine about every 3 months. Some examples of over the counter antihistamines include Zyrtec (cetirizine), Xyzal (levocetirizine), Allegra (fexofenadine), and Claritin (loratidine).  Begin Nasacort 2 sprays in each nostril once a day as needed for a stuffy nose.  In the right nostril, point the applicator out toward the right ear. In the left nostril, point the applicator out toward the left ear Begin azelastine 2 sprays in each nostril twice a day as needed for a runny nose Consider saline nasal rinses or saline nasal mist as needed for nasal symptoms. Use this before any medicated nasal sprays for best result For dry nostrils you may use saline nasal gel as needed For thick postnasal drainage, begin Mucinex 600 to 1200 mg twice a day and increase fluid intake in order to thin mucus If this treatment plan is not effective for controlling your symptoms, consider allergen immunotherapy as a means of long-term control of your symptoms  Eustachian tube dysfunction/muffled hearing Begin treatment plan for allergic rhinitis as listed above If your hearing does not improve after beginning the treatment plan listed above, recommend evaluation of hearing/ENT referral  Asthma Continue albuterol 2 puffs once every 4 hours as needed for cough or wheeze You may use albuterol 2 puffs 5-15 minutes before activity to reduce cough or wheeze  Call the clinic if this treatment plan is not working well for you  Follow up in 3 months or sooner if needed.  I appreciate the  opportunity to take part in [facility] care. Please do not hesitate to contact me with questions.  Sincerely, Gareth Morgan, FNP [facility]  Attestation: I performed/discussed the history and physical examination of the patient as well as management with NP Ambs. I reviewed the NP's note and agree with the documented findings and plan of care with following additions/exceptions:  Prudy Feeler, MD Allergy/Immunology  [facility]

## 2021-01-21 NOTE — Patient Instructions (Addendum)
Allergic rhinitis Your skin testing at today's visit was positive to grass pollen, dust mite, cat, and dog Allergen avoidance measures are provided below Begin montelukast 10 mg once a day for better control of allergy symptoms.   Begin levocetirizine 5 mg once a day as needed for a runny nose. Remember to rotate to a different antihistamine about every 3 months. Some examples of over the counter antihistamines include Zyrtec (cetirizine), Xyzal (levocetirizine), Allegra (fexofenadine), and Claritin (loratidine).  Begin Nasacort 2 sprays in each nostril once a day as needed for a stuffy nose.  In the right nostril, point the applicator out toward the right ear. In the left nostril, point the applicator out toward the left ear Begin azelastine 2 sprays in each nostril twice a day as needed for a runny nose Consider saline nasal rinses or saline nasal mist as needed for nasal symptoms. Use this before any medicated nasal sprays for best result For dry nostrils you may use saline nasal gel as needed For thick postnasal drainage, begin Mucinex 600 to 1200 mg twice a day and increase fluid intake in order to thin mucus If this treatment plan is not effective for controlling your symptoms, consider allergen immunotherapy as a means of long-term control of your symptoms  Eustachian tube dysfunction/muffled hearing Begin treatment plan for allergic rhinitis as listed above If your hearing does not improve after beginning the treatment plan listed above, recommend evaluation of hearing  Asthma Continue albuterol 2 puffs once every 4 hours as needed for cough or wheeze You may use albuterol 2 puffs 5-15 minutes before activity to reduce cough or wheeze  Call the clinic if this treatment plan is not working well for you  Follow up in 3 months or sooner if needed.  Reducing Pollen Exposure The American Academy of Allergy, Asthma and Immunology suggests the following steps to reduce your exposure to  pollen during allergy seasons. 1. Do not hang sheets or clothing out to dry; pollen may collect on these items. 2. Do not mow lawns or spend time around freshly cut grass; mowing stirs up pollen. 3. Keep windows closed at night.  Keep car windows closed while driving. 4. Minimize morning activities outdoors, a time when pollen counts are usually at their highest. 5. Stay indoors as much as possible when pollen counts or humidity is high and on windy days when pollen tends to remain in the air longer. 6. Use air conditioning when possible.  Many air conditioners have filters that trap the pollen spores. 7. Use a HEPA room air filter to remove pollen form the indoor air you breathe.   Control of Dust Mite Allergen Dust mites play a major role in allergic asthma and rhinitis. They occur in environments with high humidity wherever human skin is found. Dust mites absorb humidity from the atmosphere (ie, they do not drink) and feed on organic matter (including shed human and animal skin). Dust mites are a microscopic type of insect that you cannot see with the naked eye. High levels of dust mites have been detected from mattresses, pillows, carpets, upholstered furniture, bed covers, clothes, soft toys and any woven material. The principal allergen of the dust mite is found in its feces. A gram of dust may contain 1,000 mites and 250,000 fecal particles. Mite antigen is easily measured in the air during house cleaning activities. Dust mites do not bite and do not cause harm to humans, other than by triggering allergies/asthma.  Ways to decrease your exposure  to dust mites in your home:  1. Encase mattresses, box springs and pillows with a mite-impermeable barrier or cover  2. Wash sheets, blankets and drapes weekly in hot water (130 F) with detergent and dry them in a dryer on the hot setting.  3. Have the room cleaned frequently with a vacuum cleaner and a damp dust-mop. For carpeting or rugs,  vacuuming with a vacuum cleaner equipped with a high-efficiency particulate air (HEPA) filter. The dust mite allergic individual should not be in a room which is being cleaned and should wait 1 hour after cleaning before going into the room.  4. Do not sleep on upholstered furniture (eg, couches).  5. If possible removing carpeting, upholstered furniture and drapery from the home is ideal. Horizontal blinds should be eliminated in the rooms where the person spends the most time (bedroom, study, television room). Washable vinyl, roller-type shades are optimal.  6. Remove all non-washable stuffed toys from the bedroom. Wash stuffed toys weekly like sheets and blankets above.  7. Reduce indoor humidity to less than 50%. Inexpensive humidity monitors can be purchased at most hardware stores. Do not use a humidifier as can make the problem worse and are not recommended.  Control of Dog or Cat Allergen Avoidance is the best way to manage a dog or cat allergy. If you have a dog or cat and are allergic to dog or cats, consider removing the dog or cat from the home. If you have a dog or cat but don't want to find it a new home, or if your family wants a pet even though someone in the household is allergic, here are some strategies that may help keep symptoms at bay:  8. Keep the pet out of your bedroom and restrict it to only a few rooms. Be advised that keeping the dog or cat in only one room will not limit the allergens to that room. 9. Don't pet, hug or kiss the dog or cat; if you do, wash your hands with soap and water. 10. High-efficiency particulate air (HEPA) cleaners run continuously in a bedroom or living room can reduce allergen levels over time. 11. Regular use of a high-efficiency vacuum cleaner or a central vacuum can reduce allergen levels. 12. Giving your dog or cat a bath at least once a week can reduce airborne allergen.

## 2021-03-03 ENCOUNTER — Other Ambulatory Visit: Payer: Self-pay | Admitting: Family Medicine

## 2021-03-03 DIAGNOSIS — E785 Hyperlipidemia, unspecified: Secondary | ICD-10-CM

## 2021-03-03 DIAGNOSIS — E1149 Type 2 diabetes mellitus with other diabetic neurological complication: Secondary | ICD-10-CM

## 2021-03-03 DIAGNOSIS — E1169 Type 2 diabetes mellitus with other specified complication: Secondary | ICD-10-CM

## 2021-03-13 ENCOUNTER — Other Ambulatory Visit: Payer: Self-pay | Admitting: Family Medicine

## 2021-03-13 DIAGNOSIS — G4709 Other insomnia: Secondary | ICD-10-CM

## 2021-03-13 NOTE — Telephone Encounter (Signed)
Requested medication (s) are due for refill today: yes  Requested medication (s) are on the active medication list: yes  Last refill:  10/19/20  Future visit scheduled: no  Notes to clinic:  Med not delegated to NT to RF   Requested Prescriptions  Pending Prescriptions Disp Refills   zolpidem (AMBIEN) 5 MG tablet [Pharmacy Med Name: Zolpidem Tartrate 5 MG Oral Tablet] 30 tablet 0    Sig: TAKE 1 TABLET BY MOUTH AT BEDTIME AS NEEDED FOR SLEEP      Not Delegated - Psychiatry:  Anxiolytics/Hypnotics Failed - 03/13/2021  2:11 PM      Failed - This refill cannot be delegated      Failed - Urine Drug Screen completed in last 360 days      Passed - Valid encounter within last 6 months    Recent Outpatient Visits           4 months ago Type 2 diabetes mellitus with other neurologic complication, without long-term current use of insulin ([place])   [place], [place], MD   1 year ago Type 2 diabetes mellitus with other neurologic complication, without long-term current use of insulin ([place])   [place], [place], MD   1 year ago Type 2 diabetes mellitus with other neurologic complication, without long-term current use of insulin ([place])   [place], Charlane Ferretti, MD   1 year ago Spasm   [place], Enobong, MD   1 year ago Essential hypertension   [place], Enobong, MD       Future Appointments             In 1 month Padgett, Rae Halsted, MD [facility]

## 2021-04-15 ENCOUNTER — Encounter: Payer: Self-pay | Admitting: Allergy

## 2021-04-15 ENCOUNTER — Other Ambulatory Visit: Payer: Self-pay

## 2021-04-15 ENCOUNTER — Ambulatory Visit (INDEPENDENT_AMBULATORY_CARE_PROVIDER_SITE_OTHER): Payer: Medicaid Other | Admitting: Allergy

## 2021-04-15 VITALS — BP 134/98 | HR 96 | Resp 16

## 2021-04-15 DIAGNOSIS — H6983 Other specified disorders of Eustachian tube, bilateral: Secondary | ICD-10-CM

## 2021-04-15 DIAGNOSIS — J302 Other seasonal allergic rhinitis: Secondary | ICD-10-CM

## 2021-04-15 DIAGNOSIS — J3089 Other allergic rhinitis: Secondary | ICD-10-CM | POA: Diagnosis not present

## 2021-04-15 DIAGNOSIS — J4520 Mild intermittent asthma, uncomplicated: Secondary | ICD-10-CM

## 2021-04-15 MED ORDER — LEVOCETIRIZINE DIHYDROCHLORIDE 5 MG PO TABS: 5.0000 mg | ORAL_TABLET | Freq: Every evening | ORAL | 5 refills | Status: DC

## 2021-04-15 MED ORDER — MOMETASONE FUROATE 50 MCG/ACT NA SUSP: NASAL | 5 refills | Status: DC

## 2021-04-15 MED ORDER — AZELASTINE HCL 0.1 % NA SOLN: NASAL | 5 refills | Status: DC

## 2021-04-15 NOTE — Patient Instructions (Addendum)
Allergic rhinitis -Continue avoidance measures for grass pollen, dust mite, cat, and dog -Remain off montelukast as not effective -Use levocetirizine 5 mg once a day for general allergy symptom control  -Continue Nasonex 2 sprays in each nostril once a day.   In the right nostril, point the applicator out toward the right ear. In the left nostril, point the applicator out toward the left ear -Use Azelastine 2 sprays in each nostril twice a day as needed for a runny nose -Consider saline nasal rinses or saline nasal mist to help keep nose moisturized and flushed out -If medication management is not effective then you may benefit from course of allergen immunotherapy (allergy shots) which is a 3-5 year therapy to help retrain your body to become tolerant to the allergens above.  Thus if you are tolerant then you will have less symptoms and need to use less medication.  Eustachian tube dysfunction/muffled hearing -treatment plan for allergic rhinitis as listed above -If your hearing does not improve after beginning the treatment plan listed above, recommend evaluation of your hearing  Asthma -Have access to albuterol inhaler 2 puffs every 4-6 hours as needed for cough/wheeze/shortness of breath/chest tightness.  May use 15-20 minutes prior to activity.   Monitor frequency of use.     Follow up in 3 months or sooner if needed.

## 2021-04-15 NOTE — Progress Notes (Signed)
Follow-up Note  RE: Paige Andrews MRN: [id number] DOB: February 21, 1956 Date of Office Visit: 04/15/2021   History of present illness: Paige Andrews is a 65 y.o. female presenting today for follow-up of allergic rhinitis with eustachian tube dysfunction and asthma.  She was last in the office on 01/21/2021 by myself and our nurse practitioner Ambs.  Unfortunately she states that the azelastine nasal spray she never got from the pharmacy nor has she been able to take levocetirizine or Nasacort as she states she is not able to afford over-the-counter medications.  Thus the only medication that she has had is montelukast but she does not feel that this has been effective since she has been on it and does not take it.  But she continues to report the same symptoms including nasal congestion, rhinorrhea, cough.  She also has issues with muffled hearing she feels like her ears are clogged. In regards to her asthma she does have access to albuterol and states she uses it once every other month or so for cough or wheeze symptoms.  She denies any daytime or nighttime symptoms at this time.  She has not had any ED or urgent care visits or any systemic steroid needs.     Review of systems: Review of Systems  Constitutional: Negative.   HENT:       See HPI  Eyes: Negative.   Respiratory: Negative.   Cardiovascular: Negative.   Gastrointestinal: Negative.   Musculoskeletal: Negative.   Skin: Negative.   Neurological: Negative.     All other systems negative unless noted above in HPI  Past medical/social/surgical/family history have been reviewed and are unchanged unless specifically indicated below.  No changes  Medication List: Current Outpatient Medications  Medication Sig Dispense Refill  . ACCU-CHEK SOFTCLIX LANCETS lancets Use as instructed to test sugar once daily 100 each 12  . alendronate (FOSAMAX) 70 MG tablet Take 70 mg by mouth once a week.    Marland Kitchen atorvastatin (LIPITOR) 80 MG tablet Take  1 tablet (80 mg total) by mouth daily. 90 tablet 1  . azelastine (ASTELIN) 0.1 % nasal spray Use two sprays in each nostril twice daily as needed for runny nose. 30 mL 5  . baclofen (LIORESAL) 10 MG tablet TAKE 1 TABLET BY MOUTH THREE TIMES DAILY 90 tablet 0  . calcium citrate-vitamin D (CITRACAL+D) 315-200 MG-UNIT tablet Take 1 tablet by mouth daily.    . diphenhydrAMINE (BENADRYL) 25 MG tablet Take 25 mg by mouth every 6 (six) hours as needed.    . gabapentin (NEURONTIN) 600 MG tablet Take 600 mg by mouth 3 (three) times daily.    Marland Kitchen glipiZIDE (GLUCOTROL) 5 MG tablet Take 0.5 tablets (2.5 mg total) by mouth 2 (two) times daily before a meal. 30 tablet 6  . hydrOXYzine (ATARAX/VISTARIL) 50 MG tablet Take 50 mg by mouth 3 (three) times daily as needed.    Marland Kitchen ibuprofen (ADVIL) 200 MG tablet Take 400 mg by mouth 3 (three) times daily.    Marland Kitchen levocetirizine (XYZAL) 5 MG tablet Take 1 tablet (5 mg total) by mouth every evening. 30 tablet 5  . losartan-hydrochlorothiazide (HYZAAR) 50-12.5 MG tablet Take 1 tablet by mouth daily. 30 tablet 6  . magnesium oxide (MAG-OX) 400 (241.3 Mg) MG tablet Take 1 tablet by mouth daily.    . meclizine (ANTIVERT) 25 MG tablet Take 25 mg by mouth 3 (three) times daily as needed for dizziness.    . metFORMIN (GLUCOPHAGE) 500 MG tablet Take  2 tablets (1,000 mg total) by mouth 2 (two) times daily with a meal. 360 tablet 1  . mometasone (NASONEX) 50 MCG/ACT nasal spray Use two sprays in each nostril once daily as directed. 17 g 5  . montelukast (SINGULAIR) 10 MG tablet Take 10 mg by mouth at bedtime.    Marland Kitchen oxyCODONE-acetaminophen (PERCOCET/ROXICET) 5-325 MG tablet Take 1 tablet by mouth every 6 (six) hours.    . pantoprazole (PROTONIX) 40 MG tablet Take 1 tablet (40 mg total) by mouth daily. 60 tablet 6  . PROAIR HFA 108 (90 Base) MCG/ACT inhaler Inhale 2 puffs into the lungs every 6 (six) hours as needed.    Marland Kitchen VYVANSE 50 MG CHEW Chew 1 tablet by mouth daily.    Marland Kitchen zolpidem  (AMBIEN) 5 MG tablet TAKE 1 TABLET BY MOUTH AT BEDTIME AS NEEDED FOR SLEEP 30 tablet 2  . Blood Glucose Monitoring Suppl (ACCU-CHEK AVIVA PLUS) w/Device KIT Use as instructed to test sugar once daily 1 kit 0  . glucose blood (ACCU-CHEK AVIVA PLUS) test strip Use as instructed to test sugar once daily 100 each 12  . Lancets Misc. (ACCU-CHEK FASTCLIX LANCET) KIT Use as directed once daily 1 kit 0   No current facility-administered medications for this visit.     Known medication allergies: Allergies  Allergen Reactions  . Morphine And Related Anxiety    Patient states, " it makes me  feel like I am crawling out of my skin"     Physical examination: Blood pressure (!) 134/98, pulse 96, resp. rate 16, last menstrual period 12/11/2014, SpO2 98 %.  General: Alert, interactive, in no acute distress. HEENT: PERRLA, TMs pearly gray, turbinates moderately edematous without discharge, post-pharynx non erythematous. Neck: Supple without lymphadenopathy. Lungs: Clear to auscultation without wheezing, rhonchi or rales. {no increased work of breathing. CV: Normal S1, S2 without murmurs. Abdomen: Nondistended, nontender. Skin: Warm and dry, without lesions or rashes. Extremities:  No clubbing, cyanosis or edema. Neuro:   Grossly intact.  Diagnositics/Labs: None today  Assessment and plan:   Allergic rhinitis -Continue avoidance measures for grass pollen, dust mite, cat, and dog -Remain off montelukast as not effective -Use levocetirizine 5 mg once a day for general allergy symptom control  -Continue Nasonex 2 sprays in each nostril once a day.   In the right nostril, point the applicator out toward the right ear. In the left nostril, point the applicator out toward the left ear -Use Azelastine 2 sprays in each nostril twice a day as needed for a runny nose -Consider saline nasal rinses or saline nasal mist to help keep nose moisturized and flushed out -If medication management is not  effective then you may benefit from course of allergen immunotherapy (allergy shots) which is a 3-5 year therapy to help retrain your body to become tolerant to the allergens above.  Thus if you are tolerant then you will have less symptoms and need to use less medication.  Eustachian tube dysfunction/muffled hearing -treatment plan for allergic rhinitis as listed above -If your hearing does not improve after beginning the treatment plan listed above, recommend evaluation of your hearing  Asthma -Have access to albuterol inhaler 2 puffs every 4-6 hours as needed for cough/wheeze/shortness of breath/chest tightness.  May use 15-20 minutes prior to activity.   Monitor frequency of use.     Follow up in 3 months or sooner if needed. I appreciate the opportunity to take part in [facility] care. Please do not hesitate to contact  me with questions.  Sincerely,   Prudy Feeler, MD Allergy/Immunology [facility] [place] [facility]

## 2021-05-29 ENCOUNTER — Other Ambulatory Visit: Payer: Self-pay | Admitting: Family Medicine

## 2021-05-29 DIAGNOSIS — G8929 Other chronic pain: Secondary | ICD-10-CM

## 2021-05-29 NOTE — Telephone Encounter (Signed)
Requested medication (s) are due for refill today: yes  Requested medication (s) are on the active medication list: yes  Last refill:  01/13/21  Future visit scheduled: no  Notes to clinic:  PCP is listed as Cathlean Marseilles MD- Is this pt's PCP or a referral?  Med not delegated to NT to RF   Requested Prescriptions  Pending Prescriptions Disp Refills   baclofen (LIORESAL) 10 MG tablet [Pharmacy Med Name: Baclofen 10 MG Oral Tablet] 90 tablet 0    Sig: TAKE 1 TABLET BY MOUTH THREE TIMES DAILY      Not Delegated - Analgesics:  Muscle Relaxants Failed - 05/29/2021  9:45 AM      Failed - This refill cannot be delegated      Failed - Valid encounter within last 6 months    Recent Outpatient Visits           7 months ago Type 2 diabetes mellitus with other neurologic complication, without long-term current use of insulin ([place])   [place], [place], MD   1 year ago Type 2 diabetes mellitus with other neurologic complication, without long-term current use of insulin ([place])   [place], [place], MD   1 year ago Type 2 diabetes mellitus with other neurologic complication, without long-term current use of insulin ([place])   [place], Charlane Ferretti, MD   1 year ago Spasm   [place], [place], MD   2 years ago Essential hypertension   [place], [place], MD       Future Appointments             In 2 months [place], Rae Halsted, MD [facility] [place]

## 2021-06-03 ENCOUNTER — Encounter (HOSPITAL_COMMUNITY): Payer: Self-pay

## 2021-06-03 ENCOUNTER — Emergency Department (HOSPITAL_COMMUNITY)
Admission: EM | Admit: 2021-06-03 | Discharge: 2021-06-03 | Disposition: A | Discharge status: Home / Self Care | Payer: Medicaid Other | Attending: Emergency Medicine | Admitting: Emergency Medicine

## 2021-06-03 ENCOUNTER — Emergency Department (HOSPITAL_COMMUNITY): Payer: Medicaid Other

## 2021-06-03 ENCOUNTER — Other Ambulatory Visit: Payer: Self-pay

## 2021-06-03 DIAGNOSIS — K5900 Constipation, unspecified: Secondary | ICD-10-CM | POA: Insufficient documentation

## 2021-06-03 DIAGNOSIS — Z79899 Other long term (current) drug therapy: Secondary | ICD-10-CM | POA: Insufficient documentation

## 2021-06-03 DIAGNOSIS — J45909 Unspecified asthma, uncomplicated: Secondary | ICD-10-CM | POA: Insufficient documentation

## 2021-06-03 DIAGNOSIS — Z794 Long term (current) use of insulin: Secondary | ICD-10-CM | POA: Diagnosis not present

## 2021-06-03 DIAGNOSIS — I10 Essential (primary) hypertension: Secondary | ICD-10-CM | POA: Diagnosis not present

## 2021-06-03 DIAGNOSIS — Z7984 Long term (current) use of oral hypoglycemic drugs: Secondary | ICD-10-CM | POA: Diagnosis not present

## 2021-06-03 DIAGNOSIS — D649 Anemia, unspecified: Secondary | ICD-10-CM

## 2021-06-03 DIAGNOSIS — R42 Dizziness and giddiness: Secondary | ICD-10-CM | POA: Diagnosis not present

## 2021-06-03 DIAGNOSIS — E119 Type 2 diabetes mellitus without complications: Secondary | ICD-10-CM | POA: Insufficient documentation

## 2021-06-03 DIAGNOSIS — R799 Abnormal finding of blood chemistry, unspecified: Secondary | ICD-10-CM | POA: Diagnosis not present

## 2021-06-03 LAB — CBC WITH DIFFERENTIAL/PLATELET
Abs Immature Granulocytes: 0.02 10*3/uL (ref 0.00–0.07)
Basophils Absolute: 0 10*3/uL (ref 0.0–0.1)
Basophils Relative: 1 %
Eosinophils Absolute: 0.2 10*3/uL (ref 0.0–0.5)
Eosinophils Relative: 3 %
HCT: 29.2 % — ABNORMAL LOW (ref 36.0–46.0)
Hemoglobin: 10.4 g/dL — ABNORMAL LOW (ref 12.0–15.0)
Immature Granulocytes: 0 %
Lymphocytes Relative: 23 %
Lymphs Abs: 1.3 10*3/uL (ref 0.7–4.0)
MCH: 31 pg (ref 26.0–34.0)
MCHC: 35.6 g/dL (ref 30.0–36.0)
MCV: 86.9 fL (ref 80.0–100.0)
Monocytes Absolute: 0.5 10*3/uL (ref 0.1–1.0)
Monocytes Relative: 8 %
Neutro Abs: 3.8 10*3/uL (ref 1.7–7.7)
Neutrophils Relative %: 65 %
Platelets: 190 10*3/uL (ref 150–400)
RBC: 3.36 MIL/uL — ABNORMAL LOW (ref 3.87–5.11)
RDW: 12.6 % (ref 11.5–15.5)
WBC: 5.8 10*3/uL (ref 4.0–10.5)
nRBC: 0 % (ref 0.0–0.2)

## 2021-06-03 LAB — URINALYSIS, ROUTINE W REFLEX MICROSCOPIC
Bilirubin Urine: NEGATIVE
Glucose, UA: NEGATIVE mg/dL
Hgb urine dipstick: NEGATIVE
Ketones, ur: NEGATIVE mg/dL
Leukocytes,Ua: NEGATIVE
Nitrite: NEGATIVE
Protein, ur: NEGATIVE mg/dL
Specific Gravity, Urine: 1.004 — ABNORMAL LOW (ref 1.005–1.030)
pH: 7 (ref 5.0–8.0)

## 2021-06-03 LAB — COMPREHENSIVE METABOLIC PANEL
ALT: 22 U/L (ref 0–44)
AST: 20 U/L (ref 15–41)
Albumin: 4.2 g/dL (ref 3.5–5.0)
Alkaline Phosphatase: 55 U/L (ref 38–126)
Anion gap: 9 (ref 5–15)
BUN: 10 mg/dL (ref 8–23)
CO2: 27 mmol/L (ref 22–32)
Calcium: 10.1 mg/dL (ref 8.9–10.3)
Chloride: 95 mmol/L — ABNORMAL LOW (ref 98–111)
Creatinine, Ser: 0.79 mg/dL (ref 0.44–1.00)
GFR, Estimated: >60 mL/min (ref >60)
Glucose, Bld: 75 mg/dL (ref 70–99)
Potassium: 3.3 mmol/L — ABNORMAL LOW (ref 3.5–5.1)
Sodium: 131 mmol/L — ABNORMAL LOW (ref 135–145)
Total Bilirubin: 0.7 mg/dL (ref 0.3–1.2)
Total Protein: 6.9 g/dL (ref 6.5–8.1)

## 2021-06-03 LAB — POC OCCULT BLOOD, ED: Fecal Occult Bld: NEGATIVE

## 2021-06-03 MED ORDER — ONDANSETRON HCL 4 MG PO TABS: 4.0000 mg | ORAL_TABLET | Freq: Four times a day (QID) | ORAL | 0 refills | Status: AC

## 2021-06-03 MED ORDER — ONDANSETRON HCL 4 MG/2ML IJ SOLN
4.0000 mg | Freq: Once | INTRAMUSCULAR | Status: AC
  Administered 2021-06-03: 4 mg via INTRAVENOUS
  Filled 2021-06-03: qty 2

## 2021-06-03 MED ORDER — SODIUM CHLORIDE 0.9 % IV BOLUS
1000.0000 mL | Freq: Once | INTRAVENOUS | Status: AC
  Administered 2021-06-03: 1000 mL via INTRAVENOUS

## 2021-06-03 MED ORDER — OXYCODONE-ACETAMINOPHEN 5-325 MG PO TABS
1.0000 | ORAL_TABLET | Freq: Once | ORAL | Status: AC
  Administered 2021-06-03: 1 via ORAL
  Filled 2021-06-03: qty 1

## 2021-06-03 NOTE — ED Triage Notes (Signed)
Patient states she was called yesterday and was told her sodium level was extremely low. Patient had been to [facility].

## 2021-06-03 NOTE — Discharge Instructions (Addendum)
You were seen today in the emergency department for dizziness.  Your work-up today was very reassuring.  Your hemoglobin, which is a marker for oxygenation and blood levels, is low.  We do not see any overt signs of bleeding on your CT scan or on your fecal occult test.  That said, we think it is important that you follow-up with your PCP on July 13th.   Additionally, please discuss schedule your next colonoscopy when you meet with your PCP. Although the CT scan does not show signs of bleeding, it can not rule it out definitively.   I've prescribed you anti nausea medicine to take as needed every 6 hours.   Please return back to the ED if things change or worsen.

## 2021-06-03 NOTE — ED Provider Notes (Signed)
Patient is a 65 year old female presenting with dizziness.  Care is taken over from [facility] at shift change, please see her note for further details.  Patient is reporting with dizziness and other vague symptoms for the last month and a half. She reports feeling some lightheadedness when she gets up too quickly, intermittent headaches, feeling more tired than normal. She has had a few episodes of nausea and vomiting without hematemesis. She was sent here today because of hyponatremia - 131 today in workup. Fluids given.   Physical Exam  BP (!) 170/90   Pulse 88   Temp 98.6 F (37 C) (Oral)   Resp 13   Ht 5' 4"  (1.626 m)   Wt 77.1 kg   LMP 12/11/2014 (Approximate)   SpO2 96%   BMI 29.18 kg/m   Physical Exam Vitals and nursing note reviewed. Exam conducted with a chaperone present.  Constitutional:      General: She is not in acute distress.    Appearance: Normal appearance.     Comments: Well appearing, not actively vomiting.   HENT:     Head: Normocephalic and atraumatic.  Eyes:     General: No scleral icterus.    Extraocular Movements: Extraocular movements intact.     Pupils: Pupils are equal, round, and reactive to light.  Musculoskeletal:     Comments: Ambulatory across the room.  Skin:    Coloration: Skin is not jaundiced.  Neurological:     General: No focal deficit present.     Mental Status: She is alert. Mental status is at baseline.     Cranial Nerves: No cranial nerve deficit.     Coordination: Coordination normal.    ED Course/Procedures     Procedures  MDM   On her work-up today it was noted that her hemoglobin is down to 4 units from previously.  Typical test was negative, so a CT abdomen was ordered and is pending at the time of signout.  CT scan did not show any overt signs of pancreatitis, or any cause of anemia or hyponatremia.  I discussed results of the work-up with the patient.  I discussed strict return precautions with the patient as well.   She has a follow-up appoint with her primary care provider on July 13.  I have discussed the importance of following up with her for further work-up regarding her anemia and hyponatremia.  We also discussed that a colonoscopy should be scheduled.  Patient verbalized understanding and agreement with the plan.  Based on the work-up today I do not have concern of a GI bleed.  She is Hemoccult negative, hemodynamically stable, without findings that are concerning on CT abdomen.  She also does not report any signs of blood loss.  Her orthostatic vital signs were stable, and she is able to walk without instability.  I witnessed her walk across the room without problems.  This is reassuring and I think the patient is safe for disposition home with follow-up.       Sherrill Raring, PA-C 06/03/21 2253    Little, Wenda Overland, MD 06/04/21 [phone]

## 2021-06-03 NOTE — ED Provider Notes (Signed)
[facility] DEPT Provider Note   CSN: [id number] Arrival date & time: 06/03/21  1012     History Chief Complaint  Patient presents with   Abnormal Lab    Paige Andrews is a 65 y.o. female with pertinent past medical history of bipolar 1 disorder, IBS, anxiety, depression, hypertension that presents to the emerge department today because she was called by her PCP saying that her sodium levels were low.  Patient states that she went to [facility].  Patient states that she normally gets her blood work checked every month because she is on a opioid for her sciatic pain/arthritis on her right side.  Patient states that she does not remember what her sodium was, however she was told to come here.  Today it is 131 with lab work.  Patient states that she has been feeling the same symptoms for a month and a half which include fatigue, dizziness, nausea and her typical arthritic and sciatic pain.  Patient states that dizziness is nonvertiginous, feels like a light headed sensation. Denies any fevers, weight changes, gait abnormality, headache, head trauma, vision changes, paresthesias.  Denies any chest pain or shortness of breath or abdominal pain.  Denies any pain anywhere.    HPI     Past Medical History:  Diagnosis Date   Anxiety    Asthma    Bipolar 1 disorder ([place])    Celiac disease    Depression    Hypertension    IBS (irritable bowel syndrome)     Patient Active Problem List   Diagnosis Date Noted   Bipolar 1 disorder ([place]) 04/03/2019   Anxiety 04/03/2019   Hypertension 08/26/2018   Diabetes ([place]) 01/01/2018   Asthma 09/06/2017   Depression 06/08/2017   Back pain 06/08/2017   Shoulder pain 06/08/2017   Insomnia 06/08/2017   IBS (irritable bowel syndrome) 03/30/2017   GERD (gastroesophageal reflux disease) 03/30/2017   Constipation 01/23/2017   Nausea & vomiting 01/21/2017   Transaminitis 01/21/2017   Pancreatic abnormality  01/21/2017   Injury of right hand 09/14/2015   Rash 09/14/2015    Past Surgical History:  Procedure Laterality Date   carpel tunnel Right    CESAREAN SECTION     SHOULDER SURGERY Right      OB History     Gravida  0   Para      Term      Preterm      AB      Living         SAB      IAB      Ectopic      Multiple      Live Births              Family History  Problem Relation Age of Onset   Dementia Mother    Breast cancer Mother    Pancreatitis Father    Dementia Father    COPD Father    Multiple sclerosis Sister    Multiple sclerosis Brother    Allergic rhinitis Brother     Social History   Tobacco Use   Smoking status: Never   Smokeless tobacco: Never  Vaping Use   Vaping Use: Never used  Substance Use Topics   Alcohol use: Not Currently    Alcohol/week: 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 Authorizing Provider  ACCU-CHEK SOFTCLIX LANCETS lancets Use as instructed to test sugar once daily 08/26/18   Charlott Rakes, MD  alendronate (FOSAMAX) 70 MG tablet Take 70 mg by mouth once a week. 01/24/21   [provider]  atorvastatin (LIPITOR) 80 MG tablet Take 1 tablet (80 mg total) by mouth daily. 10/19/20   Charlott Rakes, MD  azelastine (ASTELIN) 0.1 % nasal spray Use two sprays in each nostril twice daily as needed for runny nose. 04/15/21   Kennith Gain, MD  baclofen (LIORESAL) 10 MG tablet TAKE 1 TABLET BY MOUTH THREE TIMES DAILY 01/13/21   Charlott Rakes, MD  Blood Glucose Monitoring Suppl (ACCU-CHEK AVIVA PLUS) w/Device KIT Use as instructed to test sugar once daily 07/23/18   Charlott Rakes, MD  calcium citrate-vitamin D (CITRACAL+D) 315-200 MG-UNIT tablet Take 1 tablet by mouth daily.    [provider]  diphenhydrAMINE (BENADRYL) 25 MG tablet Take 25 mg by mouth every 6 (six) hours as  needed.    [provider]  gabapentin (NEURONTIN) 600 MG tablet Take 600 mg by mouth 3 (three) times daily. 03/23/21   [provider]  glipiZIDE (GLUCOTROL) 5 MG tablet Take 0.5 tablets (2.5 mg total) by mouth 2 (two) times daily before a meal. 10/19/20   Charlott Rakes, MD  glucose blood (ACCU-CHEK AVIVA PLUS) test strip Use as instructed to test sugar once daily 07/23/18   Charlott Rakes, MD  hydrOXYzine (ATARAX/VISTARIL) 50 MG tablet Take 50 mg by mouth 3 (three) times daily as needed.    [provider]  ibuprofen (ADVIL) 200 MG tablet Take 400 mg by mouth 3 (three) times daily.    [provider]  Lancets Misc. (ACCU-CHEK FASTCLIX LANCET) KIT Use as directed once daily 07/29/18   Charlott Rakes, MD  levocetirizine (XYZAL) 5 MG tablet Take 1 tablet (5 mg total) by mouth every evening. 04/15/21   Kennith Gain, MD  losartan-hydrochlorothiazide (HYZAAR) 50-12.5 MG tablet Take 1 tablet by mouth daily. 10/19/20   Charlott Rakes, MD  magnesium oxide (MAG-OX) 400 (241.3 Mg) MG tablet Take 1 tablet by mouth daily. 01/08/21   [provider]  meclizine (ANTIVERT) 25 MG tablet Take 25 mg by mouth 3 (three) times daily as needed for dizziness.    [provider]  metFORMIN (GLUCOPHAGE) 500 MG tablet Take 2 tablets (1,000 mg total) by mouth 2 (two) times daily with a meal. 10/19/20   [place]lin, Enobong, MD  mometasone (NASONEX) 50 MCG/ACT nasal spray Use two sprays in each nostril once daily as directed. 04/15/21   Kennith Gain, MD  montelukast (SINGULAIR) 10 MG tablet Take 10 mg by mouth at bedtime.    [provider]  oxyCODONE-acetaminophen (PERCOCET/ROXICET) 5-325 MG tablet Take 1 tablet by mouth every 6 (six) hours. 03/23/21   [provider]  pantoprazole (PROTONIX) 40 MG tablet Take 1 tablet (40 mg total) by mouth daily. 10/19/20   Charlott Rakes, MD  PROAIR HFA 108 (90 Base) MCG/ACT inhaler Inhale 2 puffs into  the lungs every 6 (six) hours as needed. 11/24/20   [provider]  VYVANSE 50 MG CHEW Chew 1 tablet by mouth daily. 03/23/21   [provider]  zolpidem (AMBIEN) 5 MG tablet TAKE 1 TABLET BY MOUTH AT BEDTIME AS NEEDED FOR SLEEP 03/14/21   Charlott Rakes, MD    Allergies    Morphine and related  Review of Systems   Review of Systems  Constitutional:  Positive for fatigue. Negative for chills, diaphoresis and fever.  HENT:  Negative for congestion, sore throat and trouble swallowing.   Eyes:  Negative for pain and visual disturbance.  Respiratory:  Negative for cough, shortness of breath and wheezing.   Cardiovascular:  Negative for chest pain, palpitations and leg swelling.  Gastrointestinal:  Positive for nausea. Negative for abdominal distention, abdominal pain, diarrhea and vomiting.  Genitourinary:  Negative for difficulty urinating.  Musculoskeletal:  Negative for back pain, neck pain and neck stiffness.  Skin:  Negative for pallor.  Neurological:  Positive for dizziness. Negative for speech difficulty, weakness and headaches.  Psychiatric/Behavioral:  Negative for confusion.    Physical Exam Updated Vital Signs BP (!) 170/90   Pulse 88   Temp 98.6 F (37 C) (Oral)   Resp 13   Ht 5' 4"  (1.626 m)   Wt 77.1 kg   LMP 12/11/2014 (Approximate)   SpO2 96%   BMI 29.18 kg/m   Physical Exam Constitutional:      General: She is not in acute distress.    Appearance: Normal appearance. She is not ill-appearing, toxic-appearing or diaphoretic.  HENT:     Mouth/Throat:     Mouth: Mucous membranes are moist.     Pharynx: Oropharynx is clear.  Eyes:     General: No scleral icterus.    Extraocular Movements: Extraocular movements intact.     Pupils: Pupils are equal, round, and reactive to light.  Cardiovascular:     Rate and Rhythm: Normal rate and regular rhythm.     Pulses: Normal pulses.     Heart sounds: Normal heart sounds.  Pulmonary:     Effort:  Pulmonary effort is normal. No respiratory distress.     Breath sounds: Normal breath sounds. No stridor. No wheezing, rhonchi or rales.  Chest:     Chest wall: No tenderness.  Abdominal:     General: Abdomen is flat. There is no distension.     Palpations: Abdomen is soft.     Tenderness: There is no abdominal tenderness. There is no guarding or rebound.  Genitourinary:    Comments: Chaperone present. Digital Rectal exam reveals sphincter with good tone. No external hemorrhoids, masses, or fissures. Stool color is brown with no overt blood. No gross melena.    Musculoskeletal:        General: No swelling or tenderness. Normal range of motion.     Cervical back: Normal range of motion and neck supple. No rigidity.     Right lower leg: No edema.     Left lower leg: No edema.     Comments: Patient with tenderness to right back where she normally has her sciatica.  No midline tenderness.  No objective numbness.  Patient with positive leg raise.  Normal gait.  DP pulse 2+.  Skin:    General: Skin is warm and dry.     Capillary Refill: Capillary refill takes less than 2 seconds.     Coloration: Skin is not pale.  Neurological:     General: No focal deficit present.     Mental Status: She is alert and oriented to person, place, and time.     Comments: Alert. Clear speech. No facial droop. CNIII-XII grossly intact. Bilateral upper and lower extremities' sensation grossly intact. 5/5 symmetric strength with grip strength and with plantar and dorsi flexion bilaterally.  Normal finger to nose bilaterally. Negative pronator drift. Negative Romberg sign. Gait is steady and intact.  Psychiatric:        Mood and Affect: Mood normal.        Behavior: Behavior normal.    ED Results / Procedures / Treatments   Labs (all labs ordered are listed, but only abnormal results are displayed) Labs Reviewed  CBC WITH DIFFERENTIAL/PLATELET - Abnormal; Notable for the following components:      Result  Value   RBC 3.36 (*)    Hemoglobin 10.4 (*)    HCT 29.2 (*)    All other components within normal limits  COMPREHENSIVE METABOLIC PANEL - Abnormal; Notable for the following components:   Sodium 131 (*)    Potassium 3.3 (*)    Chloride 95 (*)    All other components within normal limits  URINALYSIS, ROUTINE W REFLEX MICROSCOPIC - Abnormal; Notable for the following components:   Color, Urine STRAW (*)    Specific Gravity, Urine 1.004 (*)    All other components within normal limits  POC OCCULT BLOOD, ED    EKG EKG Interpretation  Date/Time:  Friday June 03 2021 11:30:03 EDT Ventricular Rate:  85 PR Interval:  174 QRS Duration: 98 QT Interval:  358 QTC Calculation: 426 R Axis:   49 Text Interpretation: Normal sinus rhythm Low voltage QRS Borderline ECG No significant change since last tracing Confirmed by Gareth Morgan [phone]) on 06/03/2021 2:04:16 PM  Radiology No results found.  Procedures Procedures   Medications Ordered in ED Medications  sodium chloride 0.9 % bolus 1,000 mL (1,000 mLs Intravenous Bolus 06/03/21 1350)  ondansetron (ZOFRAN) injection 4 mg (4 mg Intravenous Given 06/03/21 1350)  oxyCODONE-acetaminophen (PERCOCET/ROXICET) 5-325 MG per tablet 1 tablet (1 tablet Oral Given 06/03/21 1353)    ED Course  I have reviewed the triage vital signs and the nursing notes.  Pertinent labs & imaging results that were available during my care of the patient were reviewed by me and considered in my medical decision making (see chart for details).    MDM Rules/Calculators/A&P                           Final Clinical Impression(s) / ED Diagnoses Final diagnoses:  None   Patient presents to the emerge department today for low sodium levels by her PCP.  Patient is nontoxic-appearing, appears very well.  Sodium here today 131.  Patient is also having nonspecific symptoms that have been going on for a month and a half which include fatigue weakness and nausea and  some dizziness.  Patient has a normal neuro exam, no specific findings on physical exam.  EKG appears similar to.  Work-up today shows hemoglobin of 10.4, this appears to have dropped since last year, baseline around 14.  Patient denies any rectal bleeding, tarry stools or vomiting.  Hemoccult negative.  Patient without any source for bleeding, appears well. Normal othroatics. No concerns for central causes of dizziness.  Normal neuro exam.  Spoke to attending Dr. Billy Fischer about patient presentation, she suggested CT abdomen pelvis to rule out retroperitoneal bleed since patient is having persistent sciatica with hemoglobin drop.  Patient is ambulatory with normal neuro exam.  If this is normal patient be discharged.  Pt care was handed off to H. Sage PA-C at 330. Complete history and physical and current plan have been communicated.  Please refer to their note for the remainder of ED care and ultimate disposition.  Plan is to wait for CT abdomen pelvis to rule out retroperitoneal bleed.  If this is normal patient be discharged.  Patient is ambulatory on exam, imperative that patient has good strict return return precautions since hemoglobin has dropped and patient will follow up with PCP.  I discussed this case with my attending physician who cosigned this note including patient's presenting symptoms, physical exam, and planned diagnostics and interventions. Attending physician stated agreement with plan or made changes to plan which were implemented.       Rx / DC Orders ED Discharge Orders     None        Alfredia Client, Hershal Coria 06/03/21 1610    Gareth Morgan, MD 06/03/21 2145

## 2021-06-07 ENCOUNTER — Other Ambulatory Visit: Payer: Self-pay | Admitting: Family Medicine

## 2021-06-07 DIAGNOSIS — G8929 Other chronic pain: Secondary | ICD-10-CM

## 2021-06-07 NOTE — Telephone Encounter (Signed)
   Notes to clinic:  Patient has appt scheduled for 07/26/2021 Review for refill until that time   Requested Prescriptions  Pending Prescriptions Disp Refills   baclofen (LIORESAL) 10 MG tablet [Pharmacy Med Name: Baclofen 10 MG Oral Tablet] 90 tablet 0    Sig: TAKE 1 TABLET BY MOUTH THREE TIMES DAILY      Not Delegated - Analgesics:  Muscle Relaxants Failed - 06/07/2021 11:18 AM      Failed - This refill cannot be delegated      Failed - Valid encounter within last 6 months    Recent Outpatient Visits           7 months ago Type 2 diabetes mellitus with other neurologic complication, without long-term current use of insulin ([place])   [place], [place], MD   1 year ago Type 2 diabetes mellitus with other neurologic complication, without long-term current use of insulin ([place])   [place], [place], MD   1 year ago Type 2 diabetes mellitus with other neurologic complication, without long-term current use of insulin ([place])   [place], Charlane Ferretti, MD   1 year ago Spasm   [place], Charlane Ferretti, MD   2 years ago Essential hypertension   [place], Charlane Ferretti, MD       Future Appointments             In 1 month Charlott Rakes, MD [place]   In 1 month [place], Rae Halsted, MD Allergy and [place]

## 2021-06-10 ENCOUNTER — Ambulatory Visit
Admission: RE | Admit: 2021-06-10 | Discharge: 2021-06-10 | Disposition: A | Discharge status: Home / Self Care | Payer: Medicaid Other | Source: Ambulatory Visit | Attending: Family Medicine | Admitting: Family Medicine

## 2021-06-10 ENCOUNTER — Other Ambulatory Visit: Payer: Self-pay

## 2021-06-10 DIAGNOSIS — Z1231 Encounter for screening mammogram for malignant neoplasm of breast: Secondary | ICD-10-CM

## 2021-06-12 ENCOUNTER — Other Ambulatory Visit: Payer: Self-pay | Admitting: Family Medicine

## 2021-06-12 DIAGNOSIS — G4709 Other insomnia: Secondary | ICD-10-CM

## 2021-06-12 NOTE — Telephone Encounter (Signed)
Requested medication (s) are due for refill today: yes  Requested medication (s) are on the active medication list: yes  Last refill:  03/14/21  Future visit scheduled: yes  Notes to clinic:  med not delegated to NT to RF   Requested Prescriptions  Pending Prescriptions Disp Refills   zolpidem (AMBIEN) 5 MG tablet [Pharmacy Med Name: Zolpidem Tartrate 5 MG Oral Tablet] 30 tablet 0    Sig: TAKE 1 TABLET BY MOUTH AT BEDTIME AS NEEDED FOR SLEEP      Not Delegated - Psychiatry:  Anxiolytics/Hypnotics Failed - 06/12/2021  3:08 PM      Failed - This refill cannot be delegated      Failed - Urine Drug Screen completed in last 360 days      Failed - Valid encounter within last 6 months    Recent Outpatient Visits           7 months ago Type 2 diabetes mellitus with other neurologic complication, without long-term current use of insulin ([place])   [place], [place], MD   1 year ago Type 2 diabetes mellitus with other neurologic complication, without long-term current use of insulin ([place])   [place], [place], MD   1 year ago Type 2 diabetes mellitus with other neurologic complication, without long-term current use of insulin ([place])   [place], Charlane Ferretti, MD   1 year ago Spasm   [place], MD   2 years ago Essential hypertension   [place], Charlane Ferretti, MD       Future Appointments             In 1 month Charlott Rakes, MD [place]   In 1 month Padgett, Rae Halsted, MD Allergy and [place]

## 2021-06-22 ENCOUNTER — Other Ambulatory Visit: Payer: Self-pay | Admitting: Family Medicine

## 2021-06-22 DIAGNOSIS — E1169 Type 2 diabetes mellitus with other specified complication: Secondary | ICD-10-CM

## 2021-06-22 DIAGNOSIS — E785 Hyperlipidemia, unspecified: Secondary | ICD-10-CM

## 2021-06-22 DIAGNOSIS — E1149 Type 2 diabetes mellitus with other diabetic neurological complication: Secondary | ICD-10-CM

## 2021-06-22 NOTE — Telephone Encounter (Signed)
  Notes to clinic: Review for refill Patient has different pcp listed   Requested Prescriptions  Pending Prescriptions Disp Refills   atorvastatin (LIPITOR) 80 MG tablet [Pharmacy Med Name: Atorvastatin Calcium 80 MG Oral Tablet] 90 tablet 0    Sig: Take 1 tablet by mouth once daily      Cardiovascular:  Antilipid - Statins Failed - 06/22/2021  6:08 AM      Failed - Total Cholesterol in normal range and within 360 days    Cholesterol, Total  Date Value Ref Range Status  02/02/2020 136 100 - 199 mg/dL Final          Failed - LDL in normal range and within 360 days    LDL Chol Calc (NIH)  Date Value Ref Range Status  02/02/2020 67 0 - 99 mg/dL Final          Failed - HDL in normal range and within 360 days    HDL  Date Value Ref Range Status  02/02/2020 54 >39 mg/dL Final          Failed - Triglycerides in normal range and within 360 days    Triglycerides  Date Value Ref Range Status  02/02/2020 78 0 - 149 mg/dL Final          Passed - Patient is not pregnant      Passed - Valid encounter within last 12 months    Recent Outpatient Visits           8 months ago Type 2 diabetes mellitus with other neurologic complication, without long-term current use of insulin ([place])   [place], [place], MD   1 year ago Type 2 diabetes mellitus with other neurologic complication, without long-term current use of insulin ([place])   [place], [place], MD   1 year ago Type 2 diabetes mellitus with other neurologic complication, without long-term current use of insulin ([place])   [place], Charlane Ferretti, MD   1 year ago Spasm   [place], Charlane Ferretti, MD   2 years ago Essential hypertension   [place], Charlane Ferretti, MD       Future Appointments             In 1 month Charlott Rakes, MD [place]   In 1 month Padgett, Rae Halsted, MD Allergy and [place]

## 2021-07-08 ENCOUNTER — Telehealth (INDEPENDENT_AMBULATORY_CARE_PROVIDER_SITE_OTHER): Payer: Medicaid Other

## 2021-07-08 NOTE — Telephone Encounter (Signed)
Please contact patient for assistance. Nat Christen, CMA    Copied from [place] [phone]. Topic: General - Other >> Jul 06, 2021 11:01 AM Leward Quan A wrote: Reason for CRM: Patient call in needing to speak to [facility] or [place] to give her a call back needing some help with paperwork for Medicare. Please call   Ph# [phone]

## 2021-07-12 NOTE — Telephone Encounter (Signed)
This was the pt that needed to assistance with medicare. I am unsure if [facility] assists with this.

## 2021-07-15 ENCOUNTER — Other Ambulatory Visit: Payer: Self-pay | Admitting: Family Medicine

## 2021-07-15 DIAGNOSIS — G4709 Other insomnia: Secondary | ICD-10-CM

## 2021-07-15 NOTE — Telephone Encounter (Signed)
Requested medication (s) are due for refill today: yes  Requested medication (s) are on the active medication list: yes  Last refill:  06/15/21 #30  Future visit scheduled: yes  Notes to clinic:  Please review for refill. Refill not delegated per protocol.    Requested Prescriptions  Pending Prescriptions Disp Refills   zolpidem (AMBIEN) 5 MG tablet [Pharmacy Med Name: Zolpidem Tartrate 5 MG Oral Tablet] 30 tablet 0    Sig: TAKE 1 TABLET BY MOUTH AT BEDTIME AS NEEDED FOR SLEEP      Not Delegated - Psychiatry:  Anxiolytics/Hypnotics Failed - 07/15/2021 11:53 AM      Failed - This refill cannot be delegated      Failed - Urine Drug Screen completed in last 360 days      Failed - Valid encounter within last 6 months    Recent Outpatient Visits           8 months ago Type 2 diabetes mellitus with other neurologic complication, without long-term current use of insulin ([place])   [place], [place], MD   1 year ago Type 2 diabetes mellitus with other neurologic complication, without long-term current use of insulin ([place])   [place], [place], MD   1 year ago Type 2 diabetes mellitus with other neurologic complication, without long-term current use of insulin ([place])   [place], Charlane Ferretti, MD   1 year ago Spasm   [place], Charlane Ferretti, MD   2 years ago Essential hypertension   [place], [place]obong, MD       Future Appointments             In 1 week Charlott Rakes, MD [place]   In 3 weeks Padgett, Rae Halsted, MD Allergy and [place]

## 2021-07-26 ENCOUNTER — Encounter: Payer: Medicaid Other | Admitting: Family Medicine

## 2021-07-27 ENCOUNTER — Telehealth: Payer: Self-pay

## 2021-07-27 NOTE — Telephone Encounter (Signed)
Please assist pt with app for Dr Redmond [facility] wellness)   Copied from [place]. Topic: Appointment Scheduling - Scheduling Inquiry for Clinic >> Jul 26, 2021  1:34 PM Oneta Rack wrote: Reason for CRM: patient would like a follow up call regarding [facility] her physical. Epic is not allowing me to [facility] her physical and prompting me to schedule a welcome to medicare wellness appointment. Patient would like a follow up call tomorrow

## 2021-07-27 NOTE — Telephone Encounter (Signed)
Following up on a request from the patient for assistance with applying for Medicare. Patient shared with CM they no longer need assistance.

## 2021-07-27 NOTE — Telephone Encounter (Signed)
Followed up with the patient today by phone. Patient shared she no longer needed assistance with Medicare and was advised she does not need to apply.

## 2021-08-05 ENCOUNTER — Ambulatory Visit (INDEPENDENT_AMBULATORY_CARE_PROVIDER_SITE_OTHER): Payer: Medicare Other | Admitting: Allergy

## 2021-08-05 ENCOUNTER — Encounter: Payer: Self-pay | Admitting: Allergy

## 2021-08-05 ENCOUNTER — Other Ambulatory Visit: Payer: Self-pay

## 2021-08-05 VITALS — BP 128/86 | HR 96 | Temp 98.3°F | Resp 16 | Ht 64.0 in | Wt 170.2 lb

## 2021-08-05 DIAGNOSIS — H6983 Other specified disorders of Eustachian tube, bilateral: Secondary | ICD-10-CM | POA: Diagnosis not present

## 2021-08-05 DIAGNOSIS — J302 Other seasonal allergic rhinitis: Secondary | ICD-10-CM

## 2021-08-05 DIAGNOSIS — J310 Chronic rhinitis: Secondary | ICD-10-CM | POA: Diagnosis not present

## 2021-08-05 DIAGNOSIS — J4520 Mild intermittent asthma, uncomplicated: Secondary | ICD-10-CM

## 2021-08-05 DIAGNOSIS — J3089 Other allergic rhinitis: Secondary | ICD-10-CM | POA: Diagnosis not present

## 2021-08-05 MED ORDER — SPACER/AERO-HOLDING CHAMBERS DEVI: 1.0000 | 0 refills | Status: AC

## 2021-08-05 MED ORDER — FLUTICASONE PROPIONATE HFA 110 MCG/ACT IN AERO: 2.0000 | INHALATION_SPRAY | Freq: Two times a day (BID) | RESPIRATORY_TRACT | 5 refills | Status: AC

## 2021-08-05 MED ORDER — CETIRIZINE HCL 10 MG PO CHEW: 10.0000 mg | CHEWABLE_TABLET | Freq: Every day | ORAL | 5 refills | Status: DC

## 2021-08-05 NOTE — Patient Instructions (Addendum)
Allergic rhinitis -Continue avoidance measures for grass pollen, dust mite, cat, and dog -Stop montelukast as not effective -Start Zyrtec 76m daily for general allergy symptom control  -Continue Nasonex 2 sprays in each nostril once a day.   In the right nostril, point the applicator out toward the right ear. In the left nostril, point the applicator out toward the left ear -Consider saline nasal rinses or saline nasal mist to help keep nose moisturized and flushed out -If medication management is not effective then you may benefit from course of allergen immunotherapy (allergy shots) which is a 3-5 year therapy to help retrain your body to become tolerant to the allergens above.  Thus if you are tolerant then you will have less symptoms and need to use less medication.  I think allergy shots would be a good option for you once you have had your procedures/therapies needed for your shoulder and hip -recommend ENT evaluation and would recommend rhinoscopy to look at your nose/sinus anatomy.  Will place referral  Eustachian tube dysfunction/muffled hearing -treatment plan for allergic rhinitis as listed above -continue care with your Audiologist -ENT referral as above  Asthma -Have access to albuterol inhaler 2 puffs every 4-6 hours as needed for cough/wheeze/shortness of breath/chest tightness.  May use 15-20 minutes prior to activity.   Monitor frequency of use. -Flovent 1177m 2 puffs twice a day.  Use with spacer device. This is a controller inhaler medication.   Asthma control goals:  Full participation in all desired activities (may need albuterol before activity) Albuterol use two time or less a week on average (not counting use with activity) Cough interfering with sleep two time or less a month Oral steroids no more than once a year No hospitalizations    Follow up in 3-4 months or sooner if needed.

## 2021-08-05 NOTE — Progress Notes (Signed)
Follow-up Note  RE: Paige Andrews MRN: [id number] DOB: 02/01/1956 Date of Office Visit: 08/05/2021   History of present illness: Paige Andrews is a 65 y.o. female presenting today for follow-up of allergic rhinitis, eustachian tube dysfunction and asthma.  She was last seen in the office on 04/15/2021 by myself.  She states her allergy symptoms of mostly congestion is still bad.  The nasonex does help "somewhat".  She states zyrtec is the antihistamine has helped the most but she states her insurance does not cover it and she cannot afford it over-the-counter.  The montelukast she states not effective but she has continued on it.  The azelastine she states made her congestion worse but she stopped use. She is interested in immunotherapy however she states she has a lots of issues right now with her shoulder and with her hip that she thinks is going to require surgery but she does not feel that starting immunotherapy at this time will be best with the time commitment for surgery or recovery.  She has been following with an audiologist for her hearing issues.  She states she is going to be getting 1 hearing aid.    She states she has been having to use her albuterol more as he has been having difficulty breathing.  She is not sure if it is moreso related to anxiety or not.  She states even going up a flight of stairs can cause her to have some shortness of breath.  Albuterol does help.  She is needing albuterol use daily at this time.   She has had to stop use of her CPAP device as she states se has had nightmares regarding smothering.     Review of systems: Review of Systems  Constitutional: Negative.   HENT:  Positive for congestion.        See HPI yet  Eyes: Negative.   Respiratory:  Positive for shortness of breath.        See HPI  Cardiovascular: Negative.   Gastrointestinal: Negative.   Musculoskeletal:  Positive for back pain and joint pain.  Skin: Negative.   Neurological:  Negative.    All other systems negative unless noted above in HPI  Past medical/social/surgical/family history have been reviewed and are unchanged unless specifically indicated below.  No changes  Medication List: Current Outpatient Medications  Medication Sig Dispense Refill   ACCU-CHEK SOFTCLIX LANCETS lancets Use as instructed to test sugar once daily 100 each 12   alendronate (FOSAMAX) 70 MG tablet Take 70 mg by mouth once a week.     atorvastatin (LIPITOR) 80 MG tablet Take 1 tablet by mouth once daily 30 tablet 0   baclofen (LIORESAL) 10 MG tablet TAKE 1 TABLET BY MOUTH THREE TIMES DAILY 90 tablet 0   Blood Glucose Monitoring Suppl (ACCU-CHEK AVIVA PLUS) w/Device KIT Use as instructed to test sugar once daily 1 kit 0   calcium citrate-vitamin D (CITRACAL+D) 315-200 MG-UNIT tablet Take 1 tablet by mouth daily.     diphenhydrAMINE (BENADRYL) 25 MG tablet Take 25 mg by mouth every 6 (six) hours as needed.     gabapentin (NEURONTIN) 600 MG tablet Take 600 mg by mouth 3 (three) times daily.     glipiZIDE (GLUCOTROL) 5 MG tablet Take 0.5 tablets (2.5 mg total) by mouth 2 (two) times daily before a meal. 30 tablet 6   glucose blood (ACCU-CHEK AVIVA PLUS) test strip Use as instructed to test sugar once daily 100 each 12  hydrOXYzine (ATARAX/VISTARIL) 50 MG tablet Take 50 mg by mouth 3 (three) times daily as needed.     ibuprofen (ADVIL) 200 MG tablet Take 400 mg by mouth 3 (three) times daily.     Lancets Misc. (ACCU-CHEK FASTCLIX LANCET) KIT Use as directed once daily 1 kit 0   losartan-hydrochlorothiazide (HYZAAR) 50-12.5 MG tablet Take 1 tablet by mouth daily. 30 tablet 6   magnesium oxide (MAG-OX) 400 (241.3 Mg) MG tablet Take 1 tablet by mouth daily.     meclizine (ANTIVERT) 25 MG tablet Take 25 mg by mouth 3 (three) times daily as needed for dizziness.     metFORMIN (GLUCOPHAGE) 500 MG tablet Take 2 tablets (1,000 mg total) by mouth 2 (two) times daily with a meal. 360 tablet 1    mometasone (NASONEX) 50 MCG/ACT nasal spray Use two sprays in each nostril once daily as directed. 17 g 5   montelukast (SINGULAIR) 10 MG tablet Take 10 mg by mouth at bedtime.     ondansetron (ZOFRAN) 4 MG tablet Take 1 tablet (4 mg total) by mouth every 6 (six) hours. 12 tablet 0   oxyCODONE-acetaminophen (PERCOCET/ROXICET) 5-325 MG tablet Take 1 tablet by mouth every 6 (six) hours.     pantoprazole (PROTONIX) 40 MG tablet Take 1 tablet (40 mg total) by mouth daily. 60 tablet 6   PROAIR HFA 108 (90 Base) MCG/ACT inhaler Inhale 2 puffs into the lungs every 6 (six) hours as needed.     VYVANSE 50 MG CHEW Chew 1 tablet by mouth daily.     zolpidem (AMBIEN) 5 MG tablet TAKE 1 TABLET BY MOUTH AT BEDTIME AS NEEDED FOR SLEEP 30 tablet 0   azelastine (ASTELIN) 0.1 % nasal spray Use two sprays in each nostril twice daily as needed for runny nose. (Patient not taking: Reported on 08/05/2021) 30 mL 5   levocetirizine (XYZAL) 5 MG tablet Take 1 tablet (5 mg total) by mouth every evening. (Patient not taking: Reported on 08/05/2021) 30 tablet 5   No current facility-administered medications for this visit.     Known medication allergies: Allergies  Allergen Reactions   Morphine And Related Anxiety    Patient states, " it makes me  feel like I am crawling out of my skin"     Physical examination: Blood pressure 128/86, pulse 96, temperature 98.3 F (36.8 C), temperature source Temporal, resp. rate 16, height 5' 4" (1.626 m), weight 170 lb 4 oz (77.2 kg), last menstrual period 12/11/2014, SpO2 97 %.  General: Alert, interactive, in no acute distress. HEENT: PERRLA, TMs pearly gray, turbinates moderately edematous without discharge, post-pharynx non erythematous. Neck: Supple without lymphadenopathy. Lungs: Clear to auscultation without wheezing, rhonchi or rales. {no increased work of breathing. CV: Normal S1, S2 without murmurs. Abdomen: Nondistended, nontender. Skin: Warm and dry, without lesions  or rashes. Extremities:  No clubbing, cyanosis or edema. Neuro:   Grossly intact.  Diagnositics/Labs: Spirometry: FEV1: 1.87 L 77%, FVC: 2.26 L 71%, ratio consistent with mild restrictive pattern.  Assessment and plan:   Chronic allergic rhinitis -Continue avoidance measures for grass pollen, dust mite, cat, and dog -Stop montelukast as not effective -Start Zyrtec 84m daily for general allergy symptom control  -Continue Nasonex 2 sprays in each nostril once a day.   In the right nostril, point the applicator out toward the right ear. In the left nostril, point the applicator out toward the left ear -Consider saline nasal rinses or saline nasal mist to help keep nose moisturized  and flushed out -If medication management is not effective then you may benefit from course of allergen immunotherapy (allergy shots) which is a 3-5 year therapy to help retrain your body to become tolerant to the allergens above.  Thus if you are tolerant then you will have less symptoms and need to use less medication.  I think allergy shots would be a good option for you once you have had your procedures/therapies needed for your shoulder and hip -recommend ENT evaluation and would recommend rhinoscopy to look at your nose/sinus anatomy.  Will place referral  Eustachian tube dysfunction/muffled hearing -treatment plan for allergic rhinitis as listed above -continue care with your Audiologist -ENT referral as above  Asthma -Have access to albuterol inhaler 2 puffs every 4-6 hours as needed for cough/wheeze/shortness of breath/chest tightness.  May use 15-20 minutes prior to activity.   Monitor frequency of use. -Flovent 166mg 2 puffs twice a day.  Use with spacer device. This is a controller inhaler medication.   Asthma control goals:  Full participation in all desired activities (may need albuterol before activity) Albuterol use two time or less a week on average (not counting use with activity) Cough  interfering with sleep two time or less a month Oral steroids no more than once a year No hospitalizations  Follow up in 3-4 months or sooner if needed.  I appreciate the opportunity to take part in [facility]. Please do not hesitate to contact me with questions.  Sincerely,   SPrudy Feeler MD Allergy/Immunology [facility] [place] [place]

## 2021-08-08 ENCOUNTER — Other Ambulatory Visit: Payer: Self-pay | Admitting: Family Medicine

## 2021-08-08 DIAGNOSIS — G8929 Other chronic pain: Secondary | ICD-10-CM

## 2021-08-08 DIAGNOSIS — M5441 Lumbago with sciatica, right side: Secondary | ICD-10-CM

## 2021-08-09 ENCOUNTER — Telehealth: Payer: Self-pay

## 2021-08-09 NOTE — Telephone Encounter (Signed)
-----   Message from [place], MD sent at 08/05/2021  1:46 PM EDT ----- Please place referral to ENT for "eustachian tube dysfunction and chronic rhinitis.  Request for rhinoscopy"

## 2021-08-09 NOTE — Telephone Encounter (Signed)
Patient has been referred to [facility] ENT as she has Medicaid as secondary. I called and left a detailed voicemail with this information. Patient should give their office 3-5 business days to review and contact for scheduling.   I will check care everywhere in a few days to see if the patient has been scheduled with their office.

## 2021-08-16 ENCOUNTER — Other Ambulatory Visit: Payer: Self-pay | Admitting: Family Medicine

## 2021-08-16 ENCOUNTER — Ambulatory Visit: Payer: Medicare Other | Admitting: Orthopaedic Surgery

## 2021-08-16 DIAGNOSIS — G4709 Other insomnia: Secondary | ICD-10-CM

## 2021-08-16 NOTE — Telephone Encounter (Signed)
Requested medication (s) are due for refill today: yes  Requested medication (s) are on the active medication list: yes  Last refill:  07/18/21 #30  Future visit scheduled: no  Notes to clinic:  Please review for refill. Medication not delegated per protocol.     Requested Prescriptions  Pending Prescriptions Disp Refills   zolpidem (AMBIEN) 5 MG tablet [Pharmacy Med Name: Zolpidem Tartrate 5 MG Oral Tablet] 30 tablet 0    Sig: TAKE 1 TABLET BY MOUTH AT BEDTIME AS NEEDED FOR SLEEP     Not Delegated - Psychiatry:  Anxiolytics/Hypnotics Failed - 08/16/2021  9:21 AM      Failed - This refill cannot be delegated      Failed - Urine Drug Screen completed in last 360 days      Failed - Valid encounter within last 6 months    Recent Outpatient Visits           10 months ago Type 2 diabetes mellitus with other neurologic complication, without long-term current use of insulin ([place])   [facility] [place], [place], MD   1 year ago Type 2 diabetes mellitus with other neurologic complication, without long-term current use of insulin ([place])   [place], [place], MD   1 year ago Type 2 diabetes mellitus with other neurologic complication, without long-term current use of insulin ([place])   [place], Charlane Ferretti, MD   2 years ago Spasm   [place], [place]ob[place], MD   2 years ago Essential hypertension   [place], Enobong, MD       Future Appointments             In 1 week Leandrew Koyanagi, MD [place]   In 2 months [place], Rae Halsted, MD All[facility] and [place]

## 2021-08-24 ENCOUNTER — Encounter: Payer: Self-pay | Admitting: Orthopaedic Surgery

## 2021-08-24 ENCOUNTER — Other Ambulatory Visit: Payer: Self-pay

## 2021-08-24 ENCOUNTER — Ambulatory Visit (INDEPENDENT_AMBULATORY_CARE_PROVIDER_SITE_OTHER): Payer: Medicare Other | Admitting: Orthopaedic Surgery

## 2021-08-24 ENCOUNTER — Ambulatory Visit (INDEPENDENT_AMBULATORY_CARE_PROVIDER_SITE_OTHER): Payer: Medicare Other

## 2021-08-24 DIAGNOSIS — M1611 Unilateral primary osteoarthritis, right hip: Secondary | ICD-10-CM

## 2021-08-24 NOTE — Progress Notes (Signed)
Office Visit Note   Patient: Paige Andrews           Date of Birth: Mar 02, 1956           MRN: [id number] Visit Date: 08/24/2021              Requested by: Beverley Fiedler, [place],  [place] [id number] PCP: Beverley Fiedler, FNP   Assessment & Plan: Visit Diagnoses:  1. Primary osteoarthritis of right hip     Plan: Impression is end-stage right hip DJD.  I explained that the labral tear is degenerative and part of the DJD.  Her x-rays demonstrate progression of the disease.  At this point I have seen her for couple years and we have attempted conservative management which has not been successful.  Based on her treatment options she has elected to move forward with a right total hip replacement for sometime in December.  Risk benefits rehab recovery reviewed with the patient in detail.  Questions encouraged and answered.  She is a well-controlled diabetic.  Follow-Up Instructions: No follow-ups on file.   Orders:  Orders Placed This Encounter  Procedures   XR HIP UNILAT W OR W/O PELVIS 2-3 VIEWS RIGHT   No orders of the defined types were placed in this encounter.     Procedures: No procedures performed   Clinical Data: No additional findings.   Subjective: Chief Complaint  Patient presents with   Right Hip - Pain    [name] returns today for continued right hip pain.  She is referral from Dr. Kathyrn Sheriff for recent findings of a labral tear on a hip MRI.  I have also seen her in the past for chronic hip pain.  She is very limited in regards to ADLs and she has constant pain and is unable to sleep.  She has no quality of life.  Unable to walk more than 50 feet without severe pain.   Review of Systems  Constitutional: Negative.   HENT: Negative.    Eyes: Negative.   Respiratory: Negative.    Cardiovascular: Negative.   Endocrine: Negative.   Musculoskeletal: Negative.   Neurological: Negative.   Hematological: Negative.    Psychiatric/Behavioral: Negative.    All other systems reviewed and are negative.   Objective: Vital Signs: LMP 12/11/2014 (Approximate)   Physical Exam Vitals and nursing note reviewed.  Constitutional:      Appearance: She is well-developed.  Pulmonary:     Effort: Pulmonary effort is normal.  Skin:    General: Skin is warm.     Capillary Refill: Capillary refill takes less than 2 seconds.  Neurological:     Mental Status: She is alert and oriented to person, place, and time.  Psychiatric:        Behavior: Behavior normal.        Thought Content: Thought content normal.        Judgment: Judgment normal.    Ortho Exam  Right hip exam shows significant limitation in range of motion secondary to pain and guarding.  Positive logroll and [place]inchfield sign.  Negative sciatic tension signs.  Specialty Comments:  No specialty comments available.  Imaging: XR HIP UNILAT W OR W/O PELVIS 2-3 VIEWS RIGHT  Result Date: 08/24/2021 Progression of degenerative joint disease of the right hip.  Large subchondral cyst in the superior acetabulum.  Osteophytosis of the femoral head.    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   Injury of right hand 09/14/2015   Rash 09/14/2015   Past Medical History:  Diagnosis Date   Anxiety    Asthma    Bipolar 1 disorder ([place])    Celiac disease    Depression    Hypertension    IBS (irritable bowel syndrome)     Family History  Problem Relation Age of Onset   Dementia  Mother    Breast cancer Mother    Pancreatitis Father    Dementia Father    COPD Father    Multiple sclerosis Sister    Multiple sclerosis Brother    Allergic rhinitis Brother     Past Surgical History:  Procedure Laterality Date   carpel tunnel Right    CESAREAN SECTION     SHOULDER SURGERY Right    Social History   Occupational History   Not on file  Tobacco Use   Smoking status: Never   Smokeless tobacco: Never  Vaping Use   Vaping Use: Never used  Substance and Sexual Activity   Alcohol use: Not Currently    Alcohol/week: 0.0 standard drinks    Comment: sober 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

## 2021-09-01 NOTE — Telephone Encounter (Signed)
  10/03/2021  3:30 PM Skotnicki, Mirian Mo, DO Otolaryngology

## 2021-09-13 ENCOUNTER — Ambulatory Visit (INDEPENDENT_AMBULATORY_CARE_PROVIDER_SITE_OTHER): Payer: Medicare Other | Admitting: Orthopaedic Surgery

## 2021-09-13 ENCOUNTER — Encounter: Payer: Self-pay | Admitting: Orthopaedic Surgery

## 2021-09-13 ENCOUNTER — Other Ambulatory Visit: Payer: Self-pay

## 2021-09-13 DIAGNOSIS — M1611 Unilateral primary osteoarthritis, right hip: Secondary | ICD-10-CM | POA: Diagnosis not present

## 2021-09-13 NOTE — Progress Notes (Signed)
Office Visit Note   Patient: Paige Andrews           Date of Birth: Apr 10, 1956           MRN: [id number] Visit Date: 09/13/2021              Requested by: Beverley Fiedler, [place],  [place] [id number] PCP: Beverley Fiedler, FNP   Assessment & Plan: Visit Diagnoses:  1. Primary osteoarthritis of right hip     Plan: Impression is end-stage right hip DJD.  She has had lots of physical therapy so far and has not improved her pain.  She would like to try cortisone injection today to give her some temporary relief before hip replacement which is scheduled for more than 2 months from now.  All questions answered to her satisfaction regarding the surgery.  Follow-Up Instructions: Return if symptoms worsen or fail to improve.   Orders:  Orders Placed This Encounter  Procedures   Ambulatory referral to Physical Medicine Rehab   No orders of the defined types were placed in this encounter.     Procedures: No procedures performed   Clinical Data: No additional findings.   Subjective: Chief Complaint  Patient presents with   Right Hip - Pain    HPI  Patient returns today for chronic and severe right hip pain due to DJD.  She fell about 2 weeks ago when she tripped over an ottoman and has had increased pain since then.  Her right total hip replacement is scheduled for 11/21/2021.  Review of Systems   Objective: Vital Signs: LMP 12/11/2014 (Approximate)   Physical Exam  Ortho Exam  Right hip exam shows antalgic gait and pain with weightbearing.  Range of motion of the hip is limited secondary to pain.  Specialty Comments:  No specialty comments available.  Imaging: No results found.   PMFS History: Patient Active Problem List   Diagnosis Date Noted   Bipolar 1 disorder ([place]) 04/03/2019   Anxiety 04/03/2019   Hypertension 08/26/2018   Diabetes ([place]) 01/01/2018   Asthma 09/06/2017   Depression 06/08/2017   Back pain 06/08/2017    Shoulder pain 06/08/2017   Insomnia 06/08/2017   IBS (irritable bowel syndrome) 03/30/2017   GERD (gastroesophageal reflux disease) 03/30/2017   Constipation 01/23/2017   Nausea & vomiting 01/21/2017   Transaminitis 01/21/2017   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/21/2017   Injury of right hand 09/14/2015   Rash 09/14/2015   Past Medical History:  Diagnosis Date   Anxiety    Asthma    Bipolar 1 disorder ([place])    Celiac disease    Depression    Hypertension    IBS (irritable bowel syndrome)     Family History  Problem Relation Age of Onset   Dementia Mother    Breast cancer Mother    Pancreatitis Father    Dementia Father    COPD Father    Multiple sclerosis Sister    Multiple sclerosis Brother    Allergic rhinitis Brother     Past Surgical History:  Procedure Laterality Date   carpel tunnel Right    CESAREAN SECTION     SHOULDER SURGERY Right    Social History   Occupational History   Not on file  Tobacco Use   Smoking status: Never   Smokeless tobacco: Never  Vaping Use   Vaping Use: Never used  Substance and Sexual Activity   Alcohol use: Not Currently    Alcohol/week: 0.0  standard drinks    Comment: sober 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

## 2021-09-20 ENCOUNTER — Encounter: Payer: Self-pay | Admitting: Physical Medicine and Rehabilitation

## 2021-09-20 ENCOUNTER — Ambulatory Visit (INDEPENDENT_AMBULATORY_CARE_PROVIDER_SITE_OTHER): Payer: Medicare Other | Admitting: Physical Medicine and Rehabilitation

## 2021-09-20 ENCOUNTER — Other Ambulatory Visit: Payer: Self-pay

## 2021-09-20 ENCOUNTER — Ambulatory Visit: Payer: Self-pay

## 2021-09-20 DIAGNOSIS — M25551 Pain in right hip: Secondary | ICD-10-CM | POA: Diagnosis not present

## 2021-09-20 NOTE — Progress Notes (Signed)
Pt state right hip pain. Pt state walking, standing and sitting makes the pain worse. Pt state she takes pain meds and uses heating to help ease her pain.  Numeric Pain Rating Scale and Functional Assessment Average Pain 8   In the last MONTH (on 0-10 scale) has pain interfered with the following?  1. General activity like being  able to carry out your everyday physical activities such as walking, climbing stairs, carrying groceries, or moving a chair?  Rating(10)   -BT, -Dye Allergies.

## 2021-09-20 NOTE — Progress Notes (Signed)
   Paige Andrews - 65 y.o. female MRN [id number]  Date of birth: Nov 30, 1956  Office Visit Note: Visit Date: 09/20/2021 PCP: Beverley Fiedler, FNP Referred by: Beverley Fiedler, *  Subjective: Chief Complaint  Patient presents with   Right Hip - Pain   HPI:  Paige Andrews is a 65 y.o. female who comes in today at the request of Dr. Eduard Roux for planned Right anesthetic hip arthrogram with fluoroscopic guidance.  The patient has failed conservative care including home exercise, medications, time and activity modification.  This injection will be diagnostic and hopefully therapeutic.  Please see requesting physician notes for further details and justification.  Patient's course is complicated by diabetes as well as depression and anxiety and she is very anxious about the injection today.   ROS Otherwise per HPI.  Assessment & Plan: Visit Diagnoses:    ICD-10-CM   1. Pain in right hip  M25.551 Large Joint Inj: R hip joint    XR C-ARM NO REPORT      Plan: No additional findings.   Meds & Orders: No orders of the defined types were placed in this encounter.   Orders Placed This Encounter  Procedures   Large Joint Inj: R hip joint   XR C-ARM NO REPORT    Follow-up: Return for visit to requesting physician as needed.   Procedures: Large Joint Inj: R hip joint on 09/20/2021 1:01 PM Indications: diagnostic evaluation and pain Details: 22 G 3.5 in needle, fluoroscopy-guided anterior approach  Arthrogram: No  Medications: 4 mL bupivacaine 0.25 %; 60 mg triamcinolone acetonide 40 MG/ML Outcome: tolerated well, no immediate complications  Patient exhibited fairly extreme pain response to anesthetizing the skin with 27-gauge needle as well as some brief needling in the muscle to get to the joint.  Nonetheless she ultimately did well.  There was good flow of contrast producing a partial arthrogram of the hip. The patient did have some relief of symptoms during the anesthetic  phase of the injection. Procedure, treatment alternatives, risks and benefits explained, specific risks discussed. Consent was given by the patient. Immediately prior to procedure a time out was called to verify the correct patient, procedure, equipment, support staff and site/side marked as required. Patient was prepped and draped in the usual sterile fashion.         Clinical History: 08/24/2021 Hip X-Ray Progression of degenerative joint disease of the right hip.  Large  subchondral cyst in the superior acetabulum.  Osteophytosis of the femoral  head.     Objective:  VS:  HT:    WT:   BMI:     BP:   HR: bpm  TEMP: ( )  RESP:  Physical Exam   Imaging: No results found.

## 2021-09-24 MED ORDER — TRIAMCINOLONE ACETONIDE 40 MG/ML IJ SUSP
60.0000 mg | INTRAMUSCULAR | Status: AC | PRN
  Administered 2021-09-20: 60 mg via INTRA_ARTICULAR

## 2021-09-24 MED ORDER — BUPIVACAINE HCL 0.25 % IJ SOLN
4.0000 mL | INTRAMUSCULAR | Status: AC | PRN
  Administered 2021-09-20: 4 mL via INTRA_ARTICULAR

## 2021-10-18 ENCOUNTER — Ambulatory Visit: Payer: Medicare Other | Admitting: Podiatry

## 2021-10-25 ENCOUNTER — Ambulatory Visit (INDEPENDENT_AMBULATORY_CARE_PROVIDER_SITE_OTHER): Payer: Medicare Other | Admitting: Podiatry

## 2021-10-25 ENCOUNTER — Other Ambulatory Visit: Payer: Self-pay

## 2021-10-25 ENCOUNTER — Encounter: Payer: Self-pay | Admitting: Podiatry

## 2021-10-25 DIAGNOSIS — M2141 Flat foot [pes planus] (acquired), right foot: Secondary | ICD-10-CM

## 2021-10-25 DIAGNOSIS — M2142 Flat foot [pes planus] (acquired), left foot: Secondary | ICD-10-CM

## 2021-10-25 DIAGNOSIS — E1142 Type 2 diabetes mellitus with diabetic polyneuropathy: Secondary | ICD-10-CM | POA: Diagnosis not present

## 2021-10-26 NOTE — Progress Notes (Signed)
  Subjective:  Patient ID: Paige Andrews, female    DOB: 11-09-1956,  MRN: [id number]  Chief Complaint  Patient presents with   Diabetes      NP // - Diabetic foot care     65 y.o. female presents with the above complaint. History confirmed with patient.  Referred by her doctor for diabetic foot examination, she has numbness and tingling in both feet  Objective:  Physical Exam: warm, good capillary refill, no trophic changes or ulcerative lesions, normal DP and PT pulses, abnormal monofilament and sensory exam, and pes planus deformity.  No skin lesions, skin is well perfused nails are in good condition Assessment:   1. Pes planus of both feet   2. Type 2 diabetes mellitus with diabetic polyneuropathy, without long-term current use of insulin ([place])      Plan:  Patient was evaluated and treated and all questions answered.  Patient educated on diabetes. Discussed proper diabetic foot care and discussed risks and complications of disease. Educated patient in depth on reasons to return to the office immediately should he/she discover anything concerning or new on the feet. All questions answered. Discussed proper shoes as well.   Discussed treatment of neuropathy she is on gabapentin and I discussed with her she may consider increasing the dosing of this or switching to Lyrica and she will discuss this with her PCP.  Also may benefit from extra-depth multi density insoles and diabetic shoes and she will be fitted for these  Return in about 1 year (around 10/25/2022) for diabetic foot exam .

## 2021-11-09 ENCOUNTER — Telehealth: Payer: Self-pay | Admitting: Orthopaedic Surgery

## 2021-11-09 ENCOUNTER — Telehealth: Payer: Self-pay | Admitting: Podiatry

## 2021-11-09 NOTE — Telephone Encounter (Signed)
I would just keep doing what she has been doing.

## 2021-11-09 NOTE — Telephone Encounter (Signed)
Left message for pt to call to r/s appt on 1.13.23 as Aaron Edelman will be in [place] office but has availability earlier in the week.

## 2021-11-09 NOTE — Telephone Encounter (Signed)
Patient decided to put her right total hip surgery off until next year (01-23-22).  She would like to know what her options are for managing the discomfort until then.  Please call and advise [phone]

## 2021-11-10 NOTE — Telephone Encounter (Signed)
Spoke to patient. Advised her on message below. Would like to change surgery date. Please call her.   [phone]

## 2021-11-11 ENCOUNTER — Ambulatory Visit: Payer: Medicare Other | Admitting: Allergy

## 2021-12-06 ENCOUNTER — Encounter: Payer: Medicare Other | Admitting: Orthopaedic Surgery

## 2021-12-07 ENCOUNTER — Encounter: Payer: Medicare Other | Admitting: Orthopaedic Surgery

## 2021-12-20 ENCOUNTER — Ambulatory Visit: Payer: Medicare Other | Admitting: Podiatry

## 2021-12-21 ENCOUNTER — Encounter: Payer: Self-pay | Admitting: *Deleted

## 2021-12-23 ENCOUNTER — Other Ambulatory Visit: Payer: Medicare Other

## 2021-12-26 ENCOUNTER — Ambulatory Visit: Payer: Medicare Other | Admitting: Psychiatry

## 2021-12-30 ENCOUNTER — Ambulatory Visit: Payer: Self-pay | Admitting: Orthopedic Surgery

## 2022-01-16 ENCOUNTER — Ambulatory Visit: Payer: Medicare Other

## 2022-01-23 ENCOUNTER — Ambulatory Visit: Payer: Self-pay | Admitting: Orthopedic Surgery

## 2022-01-23 NOTE — H&P (View-Only) (Signed)
TOTAL HIP ADMISSION H&P  Patient is admitted for right total hip arthroplasty.  Subjective:  Chief Complaint: right hip pain  HPI: Paige Andrews, 66 y.o. female, has a history of pain and functional disability in the right hip(s) due to arthritis and patient has failed non-surgical conservative treatments for greater than 12 weeks to include NSAID's and/or analgesics, flexibility and strengthening excercises, supervised PT with diminished ADL's post treatment, use of assistive devices, weight reduction as appropriate, and activity modification.  Onset of symptoms was gradual starting 2 years ago with rapidlly worsening course since that time.The patient noted no past surgery on the right hip(s).  Patient currently rates pain in the right hip at 10 out of 10 with activity. Patient has night pain, worsening of pain with activity and weight bearing, trendelenberg gait, pain that interfers with activities of daily living, pain with passive range of motion, and crepitus. Patient has evidence of subchondral cysts, subchondral sclerosis, periarticular osteophytes, and joint space narrowing by imaging studies. This condition presents safety issues increasing the risk of falls.  There is no current active infection.  Patient Active Problem List   Diagnosis Date Noted   Bipolar 1 disorder ([place]) 04/03/2019   Anxiety 04/03/2019   Hypertension 08/26/2018   Diabetes ([place]) 01/01/2018   Asthma 09/06/2017   Depression 06/08/2017   Back pain 06/08/2017   Shoulder pain 06/08/2017   Insomnia 06/08/2017   IBS (irritable bowel syndrome) 03/30/2017   GERD (gastroesophageal reflux disease) 03/30/2017   Constipation 01/23/2017   Nausea & vomiting 01/21/2017   Transaminitis 01/21/2017   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/21/2017   Injury of right hand 09/14/2015   Rash 09/14/2015   Past Medical History:  Diagnosis Date   ADD (attention deficit disorder)    Anxiety    Asthma    Binge eating disorder    Bipolar 1  disorder ([facility])    Celiac disease    Chronic lumbar pain    Depression    Diabetes ([facility])    Hypercholesterolemia    Hypertension    Hyperthyroidism    IBS (irritable bowel syndrome)    Migraine    OSA (obstructive sleep apnea)    PTSD (post-traumatic stress disorder)     Past Surgical History:  Procedure Laterality Date   carpel tunnel Right    CESAREAN SECTION     SHOULDER SURGERY Right     Current Outpatient Medications  Medication Sig Dispense Refill Last Dose   ACCU-CHEK SOFTCLIX LANCETS lancets Use as instructed to test sugar once daily 100 each 12    alendronate (FOSAMAX) 70 MG tablet Take 70 mg by mouth once a week.      atorvastatin (LIPITOR) 80 MG tablet Take 1 tablet by mouth once daily 30 tablet 0    azelastine (ASTELIN) 0.1 % nasal spray Use two sprays in each nostril twice daily as needed for runny nose. 30 mL 5    baclofen (LIORESAL) 10 MG tablet TAKE 1 TABLET BY MOUTH THREE TIMES DAILY 90 tablet 0    Blood Glucose Monitoring Suppl (ACCU-CHEK AVIVA PLUS) w/Device KIT Use as instructed to test sugar once daily 1 kit 0    butalbital-aspirin-caffeine-codeine (FIORINAL WITH CODEINE) 50-325-40-30 MG capsule Take by mouth.      calcium citrate-vitamin D (CITRACAL+D) 315-200 MG-UNIT tablet Take 1 tablet by mouth daily.      cetirizine (ZYRTEC) 10 MG chewable tablet Chew 1 tablet (10 mg total) by mouth daily. 30 tablet 5    diazepam (VALIUM) 5 MG  tablet Take by mouth.      diphenhydrAMINE (BENADRYL) 25 MG tablet Take 25 mg by mouth every 6 (six) hours as needed.      escitalopram (LEXAPRO) 5 MG tablet Take 5 mg by mouth daily.      fluticasone (FLOVENT HFA) 110 MCG/ACT inhaler Inhale 2 puffs into the lungs 2 (two) times daily. 1 each 5    gabapentin (NEURONTIN) 600 MG tablet Take 600 mg by mouth 3 (three) times daily.      glipiZIDE (GLUCOTROL) 5 MG tablet Take 0.5 tablets (2.5 mg total) by mouth 2 (two) times daily before a meal. 30 tablet 6    glucose blood (ACCU-CHEK AVIVA  PLUS) test strip Use as instructed to test sugar once daily 100 each 12    hydrOXYzine (ATARAX/VISTARIL) 50 MG tablet Take 50 mg by mouth 3 (three) times daily as needed.      ibuprofen (ADVIL) 200 MG tablet Take 400 mg by mouth 3 (three) times daily.      Lancets Misc. (ACCU-CHEK FASTCLIX LANCET) KIT Use as directed once daily 1 kit 0    levocetirizine (XYZAL) 5 MG tablet Take 5 mg by mouth daily.      losartan-hydrochlorothiazide (HYZAAR) 50-12.5 MG tablet Take 1 tablet by mouth daily. 30 tablet 6    lurasidone (LATUDA) 40 MG TABS tablet Take by mouth.      magnesium oxide (MAG-OX) 400 (241.3 Mg) MG tablet Take 1 tablet by mouth daily.      magnesium oxide (MAG-OX) 400 MG tablet Take by mouth.      meclizine (ANTIVERT) 25 MG tablet Take 25 mg by mouth 3 (three) times daily as needed for dizziness.      metFORMIN (GLUCOPHAGE) 500 MG tablet Take 2 tablets (1,000 mg total) by mouth 2 (two) times daily with a meal. 360 tablet 1    methylphenidate 54 MG PO CR tablet Take 54 mg by mouth every morning.      mometasone (NASONEX) 50 MCG/ACT nasal spray Use two sprays in each nostril once daily as directed. 17 g 5    montelukast (SINGULAIR) 10 MG tablet Take 10 mg by mouth at bedtime.      ondansetron (ZOFRAN) 4 MG tablet Take 1 tablet (4 mg total) by mouth every 6 (six) hours. 12 tablet 0    oxyCODONE-acetaminophen (PERCOCET/ROXICET) 5-325 MG tablet Take 1 tablet by mouth every 6 (six) hours.      pantoprazole (PROTONIX) 40 MG tablet Take 1 tablet (40 mg total) by mouth daily. 60 tablet 6    prazosin (MINIPRESS) 1 MG capsule Take 1 mg by mouth at bedtime.      PROAIR HFA 108 (90 Base) MCG/ACT inhaler Inhale 2 puffs into the lungs every 6 (six) hours as needed.      Spacer/Aero-Holding Chambers DEVI Take 1 each by mouth as directed. Rinse mouth after use. 1 each 0    SUTAB [phone] MG TABS Take 1 tablet by mouth daily as needed.      Vilazodone HCl (VIIBRYD) 10 MG TABS Take 10 mg by mouth daily.       VYVANSE 50 MG CHEW Chew 1 tablet by mouth daily.      zolpidem (AMBIEN) 5 MG tablet TAKE 1 TABLET BY MOUTH AT BEDTIME AS NEEDED FOR SLEEP 30 tablet 0    No current facility-administered medications for this visit.   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  All other systems reviewed and are negative.  Objective:  Physical Exam Constitutional:      Appearance: Normal appearance.  HENT:     Head: Normocephalic and atraumatic.     Nose: Nose normal.     Mouth/Throat:     Mouth: Mucous membranes are moist.     Pharynx: Oropharynx is clear.  Eyes:     Extraocular Movements: Extraocular movements intact.     Conjunctiva/sclera: Conjunctivae normal.     Pupils: Pupils are equal, round, and reactive to light.  Cardiovascular:     Rate and Rhythm: Normal rate and regular rhythm.     Pulses: Normal pulses.     Heart sounds: Normal heart sounds.  Pulmonary:     Effort: Pulmonary effort is normal. No respiratory distress.  Abdominal:     General: Abdomen is flat.     Palpations: Abdomen is soft.  Genitourinary:    Comments: deferred Musculoskeletal:     Cervical back: Neck supple.     Right hip: Bony tenderness present.  Decreased range of motion. Decreased strength.  Skin:    General: Skin is warm and dry.     Capillary Refill: Capillary refill takes less than 2 seconds.  Neurological:     General: No focal deficit present.     Mental Status: She is alert and oriented to person, place, and time.  Psychiatric:        Mood and Affect: Mood normal.        Behavior: Behavior normal.        Thought Content: Thought content normal.        Judgment: Judgment normal.    Vital signs in last 24 hours: @VSRANGES @  Labs:   Estimated body mass index is 29.22 kg/m as calculated from the following:   Height as of 08/05/21: 5' 4"  (1.626 m).   Weight as of 08/05/21: 77.2 kg.   Imaging Review Plain radiographs demonstrate severe degenerative joint disease of the right hip(s). The bone quality appears to be adequate for age and reported activity level.      Assessment/Plan:  End stage arthritis, right hip(s)  The patient history, physical examination, clinical judgement of the provider and imaging studies are consistent with end stage degenerative joint disease of the right hip(s) and total hip arthroplasty is deemed medically necessary. The treatment options including medical management, injection therapy, arthroscopy and arthroplasty were discussed at length. The risks and benefits of total hip arthroplasty were presented and reviewed. The risks due to aseptic loosening, infection, stiffness, dislocation/subluxation,  thromboembolic complications and other imponderables were  discussed.  The patient acknowledged the explanation, agreed to proceed with the plan and consent was signed. Patient is being admitted for inpatient treatment for surgery, pain control, PT, OT, prophylactic antibiotics, VTE prophylaxis, progressive ambulation and ADL's and discharge planning.The patient is planning to be discharged  home with HEP . Overnight stay for pain control, takes oxy 5 usually 4 tabs per day for back and shoulder  pain.   Patient's anticipated LOS is less than 2 midnights, meeting these requirements: - Younger than 18 - Lives within 1 hour of care - Has a competent adult at home to recover with post-op recover - NO history of  - Diabetes  - Coronary Artery Disease  - Heart failure  - Heart attack  - Stroke  - DVT/VTE  - Cardiac arrhythmia  - Respiratory Failure/COPD  - Renal failure  - Anemia  - Advanced Liver disease

## 2022-01-23 NOTE — H&P (Signed)
TOTAL HIP ADMISSION H&P  Patient is admitted for right total hip arthroplasty.  Subjective:  Chief Complaint: right hip pain  HPI: Paige Andrews, 66 y.o. female, has a history of pain and functional disability in the right hip(s) due to arthritis and patient has failed non-surgical conservative treatments for greater than 12 weeks to include NSAID's and/or analgesics, flexibility and strengthening excercises, supervised PT with diminished ADL's post treatment, use of assistive devices, weight reduction as appropriate, and activity modification.  Onset of symptoms was gradual starting 2 years ago with rapidlly worsening course since that time.The patient noted no past surgery on the right hip(s).  Patient currently rates pain in the right hip at 10 out of 10 with activity. Patient has night pain, worsening of pain with activity and weight bearing, trendelenberg gait, pain that interfers with activities of daily living, pain with passive range of motion, and crepitus. Patient has evidence of subchondral cysts, subchondral sclerosis, periarticular osteophytes, and joint space narrowing by imaging studies. This condition presents safety issues increasing the risk of falls.  There is no current active infection.  Patient Active Problem List   Diagnosis Date Noted   Bipolar 1 disorder ([place]) 04/03/2019   Anxiety 04/03/2019   Hypertension 08/26/2018   Diabetes ([place]) 01/01/2018   Asthma 09/06/2017   Depression 06/08/2017   Back pain 06/08/2017   Shoulder pain 06/08/2017   Insomnia 06/08/2017   IBS (irritable bowel syndrome) 03/30/2017   GERD (gastroesophageal reflux disease) 03/30/2017   Constipation 01/23/2017   Nausea & vomiting 01/21/2017   Transaminitis 01/21/2017   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/21/2017   Injury of right hand 09/14/2015   Rash 09/14/2015   Past Medical History:  Diagnosis Date   ADD (attention deficit disorder)    Anxiety    Asthma    Binge eating disorder    Bipolar 1  disorder ([facility])    Celiac disease    Chronic lumbar pain    Depression    Diabetes ([facility])    Hypercholesterolemia    Hypertension    Hyperthyroidism    IBS (irritable bowel syndrome)    Migraine    OSA (obstructive sleep apnea)    PTSD (post-traumatic stress disorder)     Past Surgical History:  Procedure Laterality Date   carpel tunnel Right    CESAREAN SECTION     SHOULDER SURGERY Right     Current Outpatient Medications  Medication Sig Dispense Refill Last Dose   ACCU-CHEK SOFTCLIX LANCETS lancets Use as instructed to test sugar once daily 100 each 12    alendronate (FOSAMAX) 70 MG tablet Take 70 mg by mouth once a week.      atorvastatin (LIPITOR) 80 MG tablet Take 1 tablet by mouth once daily 30 tablet 0    azelastine (ASTELIN) 0.1 % nasal spray Use two sprays in each nostril twice daily as needed for runny nose. 30 mL 5    baclofen (LIORESAL) 10 MG tablet TAKE 1 TABLET BY MOUTH THREE TIMES DAILY 90 tablet 0    Blood Glucose Monitoring Suppl (ACCU-CHEK AVIVA PLUS) w/Device KIT Use as instructed to test sugar once daily 1 kit 0    butalbital-aspirin-caffeine-codeine (FIORINAL WITH CODEINE) 50-325-40-30 MG capsule Take by mouth.      calcium citrate-vitamin D (CITRACAL+D) 315-200 MG-UNIT tablet Take 1 tablet by mouth daily.      cetirizine (ZYRTEC) 10 MG chewable tablet Chew 1 tablet (10 mg total) by mouth daily. 30 tablet 5    diazepam (VALIUM) 5 MG  tablet Take by mouth.      diphenhydrAMINE (BENADRYL) 25 MG tablet Take 25 mg by mouth every 6 (six) hours as needed.      escitalopram (LEXAPRO) 5 MG tablet Take 5 mg by mouth daily.      fluticasone (FLOVENT HFA) 110 MCG/ACT inhaler Inhale 2 puffs into the lungs 2 (two) times daily. 1 each 5    gabapentin (NEURONTIN) 600 MG tablet Take 600 mg by mouth 3 (three) times daily.      glipiZIDE (GLUCOTROL) 5 MG tablet Take 0.5 tablets (2.5 mg total) by mouth 2 (two) times daily before a meal. 30 tablet 6    glucose blood (ACCU-CHEK AVIVA  PLUS) test strip Use as instructed to test sugar once daily 100 each 12    hydrOXYzine (ATARAX/VISTARIL) 50 MG tablet Take 50 mg by mouth 3 (three) times daily as needed.      ibuprofen (ADVIL) 200 MG tablet Take 400 mg by mouth 3 (three) times daily.      Lancets Misc. (ACCU-CHEK FASTCLIX LANCET) KIT Use as directed once daily 1 kit 0    levocetirizine (XYZAL) 5 MG tablet Take 5 mg by mouth daily.      losartan-hydrochlorothiazide (HYZAAR) 50-12.5 MG tablet Take 1 tablet by mouth daily. 30 tablet 6    lurasidone (LATUDA) 40 MG TABS tablet Take by mouth.      magnesium oxide (MAG-OX) 400 (241.3 Mg) MG tablet Take 1 tablet by mouth daily.      magnesium oxide (MAG-OX) 400 MG tablet Take by mouth.      meclizine (ANTIVERT) 25 MG tablet Take 25 mg by mouth 3 (three) times daily as needed for dizziness.      metFORMIN (GLUCOPHAGE) 500 MG tablet Take 2 tablets (1,000 mg total) by mouth 2 (two) times daily with a meal. 360 tablet 1    methylphenidate 54 MG PO CR tablet Take 54 mg by mouth every morning.      mometasone (NASONEX) 50 MCG/ACT nasal spray Use two sprays in each nostril once daily as directed. 17 g 5    montelukast (SINGULAIR) 10 MG tablet Take 10 mg by mouth at bedtime.      ondansetron (ZOFRAN) 4 MG tablet Take 1 tablet (4 mg total) by mouth every 6 (six) hours. 12 tablet 0    oxyCODONE-acetaminophen (PERCOCET/ROXICET) 5-325 MG tablet Take 1 tablet by mouth every 6 (six) hours.      pantoprazole (PROTONIX) 40 MG tablet Take 1 tablet (40 mg total) by mouth daily. 60 tablet 6    prazosin (MINIPRESS) 1 MG capsule Take 1 mg by mouth at bedtime.      PROAIR HFA 108 (90 Base) MCG/ACT inhaler Inhale 2 puffs into the lungs every 6 (six) hours as needed.      Spacer/Aero-Holding Chambers DEVI Take 1 each by mouth as directed. Rinse mouth after use. 1 each 0    SUTAB [phone] MG TABS Take 1 tablet by mouth daily as needed.      Vilazodone HCl (VIIBRYD) 10 MG TABS Take 10 mg by mouth daily.       VYVANSE 50 MG CHEW Chew 1 tablet by mouth daily.      zolpidem (AMBIEN) 5 MG tablet TAKE 1 TABLET BY MOUTH AT BEDTIME AS NEEDED FOR SLEEP 30 tablet 0    No current facility-administered medications for this visit.   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  All other systems reviewed and are negative.  Objective:  Physical Exam Constitutional:      Appearance: Normal appearance.  HENT:     Head: Normocephalic and atraumatic.     Nose: Nose normal.     Mouth/Throat:     Mouth: Mucous membranes are moist.     Pharynx: Oropharynx is clear.  Eyes:     Extraocular Movements: Extraocular movements intact.     Conjunctiva/sclera: Conjunctivae normal.     Pupils: Pupils are equal, round, and reactive to light.  Cardiovascular:     Rate and Rhythm: Normal rate and regular rhythm.     Pulses: Normal pulses.     Heart sounds: Normal heart sounds.  Pulmonary:     Effort: Pulmonary effort is normal. No respiratory distress.  Abdominal:     General: Abdomen is flat.     Palpations: Abdomen is soft.  Genitourinary:    Comments: deferred Musculoskeletal:     Cervical back: Neck supple.     Right hip: Bony tenderness present.  Decreased range of motion. Decreased strength.  Skin:    General: Skin is warm and dry.     Capillary Refill: Capillary refill takes less than 2 seconds.  Neurological:     General: No focal deficit present.     Mental Status: She is alert and oriented to person, place, and time.  Psychiatric:        Mood and Affect: Mood normal.        Behavior: Behavior normal.        Thought Content: Thought content normal.        Judgment: Judgment normal.    Vital signs in last 24 hours: @VSRANGES @  Labs:   Estimated body mass index is 29.22 kg/m as calculated from the following:   Height as of 08/05/21: 5' 4"  (1.626 m).   Weight as of 08/05/21: 77.2 kg.   Imaging Review Plain radiographs demonstrate severe degenerative joint disease of the right hip(s). The bone quality appears to be adequate for age and reported activity level.      Assessment/Plan:  End stage arthritis, right hip(s)  The patient history, physical examination, clinical judgement of the provider and imaging studies are consistent with end stage degenerative joint disease of the right hip(s) and total hip arthroplasty is deemed medically necessary. The treatment options including medical management, injection therapy, arthroscopy and arthroplasty were discussed at length. The risks and benefits of total hip arthroplasty were presented and reviewed. The risks due to aseptic loosening, infection, stiffness, dislocation/subluxation,  thromboembolic complications and other imponderables were  discussed.  The patient acknowledged the explanation, agreed to proceed with the plan and consent was signed. Patient is being admitted for inpatient treatment for surgery, pain control, PT, OT, prophylactic antibiotics, VTE prophylaxis, progressive ambulation and ADL's and discharge planning.The patient is planning to be discharged  home with HEP . Overnight stay for pain control, takes oxy 5 usually 4 tabs per day for back and shoulder  pain.   Patient's anticipated LOS is less than 2 midnights, meeting these requirements: - Younger than 30 - Lives within 1 hour of care - Has a competent adult at home to recover with post-op recover - NO history of  - Diabetes  - Coronary Artery Disease  - Heart failure  - Heart attack  - Stroke  - DVT/VTE  - Cardiac arrhythmia  - Respiratory Failure/COPD  - Renal failure  - Anemia  - Advanced Liver disease

## 2022-01-25 ENCOUNTER — Ambulatory Visit: Payer: Medicare Other | Admitting: Internal Medicine

## 2022-01-25 NOTE — Progress Notes (Addendum)
Anesthesia Review:  PCP: [facility] [place] lov 12/22/21 ON CHART  CLEARANCE 12/26/21 ON CHART  Cardiologist : Chest x-ray : 12/22/21 ON CHART  EKG :6/27/222 , 12/22/21 ON CHART  Echo : Stress test: Cardiac Cath :  Activity level:  Sleep Study/ CPAP : Fasting Blood Sugar :      / Checks Blood Sugar -- times a day:   Blood Thinner/ Instructions /Last Dose: ASA / Instructions/ Last Dose :   DM- type 2- CHECKS GLUCOSE ONCE PER WEEK  Hgba1c-  01/27/22-  6.7  COVID TEST ON 02/06/22 AT 0830AM  BMP done 01/27/22- routed to Dr Lyla Glassing.

## 2022-01-26 ENCOUNTER — Ambulatory Visit: Payer: Medicare Other

## 2022-01-26 NOTE — Progress Notes (Signed)
Covid test on 02/06/22.   Come thru main entrance at [facility] long.  Have a seat in the lobby on the right as you come thru the door.  Call [phone] and let them know you are here for covid testing.     Your procedure is scheduled on:            02/08/22.    Report to [facility] Main  Entrance   Report to admitting at   [phone]     Call this number if you have problems the morning of surgery [phone]    REMEMBER: NO  SOLID FOOD CANDY OR GUM AFTER MIDNIGHT. CLEAR LIQUIDS UNTIL   0830am         . NOTHING BY MOUTH EXCEPT CLEAR LIQUIDS UNTIL     0830am  . PLEASE FINISH ENSURE DRINK PER SURGEON ORDER  WHICH NEEDS TO BE COMPLETED AT      .  0830am     CLEAR LIQUID DIET   Foods Allowed                                                                    Coffee and tea, regular and decaf                            Fruit ices (not with fruit pulp)                                      Iced Popsicles                                    Carbonated beverages, regular and diet                                    Cranberry, grape and apple juices Sports drinks like Gatorade Lightly seasoned clear broth or consume(fat free) Sugar, honey syrup ___________________________________________________________________      BRUSH YOUR TEETH MORNING OF SURGERY AND RINSE YOUR MOUTH OUT, NO CHEWING GUM CANDY OR MINTS.     Take these medicines the morning of surgery with A SIP OF WATER:  inhalers as usual and brin,g gabapentin, protonix   DO NOT TAKE ANY DIABETIC MEDICATIONS DAY OF YOUR SURGERY                               You may not have any metal on your body including hair pins and              piercings  Do not wear jewelry, make-up, lotions, powders or perfumes, deodorant             Do not wear nail polish on your fingernails.  Do not shave  48 hours prior to surgery.              Men may shave face and neck.   Do not bring valuables to the hospital. [facility]  IS NOT              RESPONSIBLE   FOR VALUABLES.  Contacts, dentures or bridgework may not be worn into surgery.  Leave suitcase in the car. After surgery it may be brought to your room.     Patients discharged the day of surgery will not be allowed to drive home. IF YOU ARE HAVING SURGERY AND GOING HOME THE SAME DAY, YOU MUST HAVE AN ADULT TO DRIVE YOU HOME AND BE WITH YOU FOR 24 HOURS. YOU MAY GO HOME BY TAXI OR UBER OR ORTHERWISE, BUT AN ADULT MUST ACCOMPANY YOU HOME AND STAY WITH YOU FOR 24 HOURS.  Name and phone number of your driver:  Special Instructions: N/A              Please read over the following fact sheets you were given: _____________________________________________________________________  [facility] - Preparing for Surgery Before surgery, you can play an important role.  Because skin is not sterile, your skin needs to be as free of germs as possible.  You can reduce the number of germs on your skin by washing with CHG (chlorahexidine gluconate) soap before surgery.  CHG is an antiseptic cleaner which kills germs and bonds with the skin to continue killing germs even after washing. Please DO NOT use if you have an allergy to CHG or antibacterial soaps.  If your skin becomes reddened/irritated stop using the CHG and inform your nurse when you arrive at Short Stay. Do not shave (including legs and underarms) for at least 48 hours prior to the first CHG shower.  You may shave your face/neck. Please follow these instructions carefully:  1.  Shower with CHG Soap the night before surgery and the  morning of Surgery.  2.  If you choose to wash your hair, wash your hair first as usual with your  normal  shampoo.  3.  After you shampoo, rinse your hair and body thoroughly to remove the  shampoo.                           4.  Use CHG as you would any other liquid soap.  You can apply chg directly  to the skin and wash                       Gently with a scrungie or clean washcloth.  5.  Apply the CHG Soap  to your body ONLY FROM THE NECK DOWN.   Do not use on face/ open                           Wound or open sores. Avoid contact with eyes, ears mouth and genitals (private parts).                       Wash face,  Genitals (private parts) with your normal soap.             6.  Wash thoroughly, paying special attention to the area where your surgery  will be performed.  7.  Thoroughly rinse your body with warm water from the neck down.  8.  DO NOT shower/wash with your normal soap after using and rinsing off  the CHG Soap.                9.  Pat yourself dry with a  clean towel.            10.  Wear clean pajamas.            11.  Place clean sheets on your bed the night of your first shower and do not  sleep with pets. Day of Surgery : Do not apply any lotions/deodorants the morning of surgery.  Please wear clean clothes to the hospital/surgery center.  FAILURE TO FOLLOW THESE INSTRUCTIONS MAY RESULT IN THE CANCELLATION OF YOUR SURGERY PATIENT SIGNATURE_________________________________  NURSE SIGNATURE__________________________________  ________________________________________________________________________

## 2022-01-27 ENCOUNTER — Other Ambulatory Visit: Payer: Self-pay

## 2022-01-27 ENCOUNTER — Encounter (HOSPITAL_COMMUNITY): Payer: Self-pay | Admitting: Orthopedic Surgery

## 2022-01-27 ENCOUNTER — Other Ambulatory Visit (HOSPITAL_COMMUNITY): Payer: Medicare Other

## 2022-01-27 ENCOUNTER — Encounter (HOSPITAL_COMMUNITY)
Admission: RE | Admit: 2022-01-27 | Discharge: 2022-01-27 | Disposition: A | Discharge status: Home / Self Care | Payer: Medicare Other | Source: Ambulatory Visit | Attending: Orthopedic Surgery | Admitting: Orthopedic Surgery

## 2022-01-27 VITALS — BP 125/85 | HR 112 | Temp 98.6°F | Resp 16 | Ht 64.0 in | Wt 167.0 lb

## 2022-01-27 DIAGNOSIS — I10 Essential (primary) hypertension: Secondary | ICD-10-CM | POA: Diagnosis not present

## 2022-01-27 DIAGNOSIS — Z01812 Encounter for preprocedural laboratory examination: Secondary | ICD-10-CM | POA: Diagnosis not present

## 2022-01-27 DIAGNOSIS — E119 Type 2 diabetes mellitus without complications: Secondary | ICD-10-CM | POA: Diagnosis not present

## 2022-01-27 DIAGNOSIS — Z01818 Encounter for other preprocedural examination: Secondary | ICD-10-CM

## 2022-01-27 LAB — BASIC METABOLIC PANEL
Anion gap: 14 (ref 5–15)
BUN: 12 mg/dL (ref 8–23)
CO2: 20 mmol/L — ABNORMAL LOW (ref 22–32)
Calcium: 9.2 mg/dL (ref 8.9–10.3)
Chloride: 96 mmol/L — ABNORMAL LOW (ref 98–111)
Creatinine, Ser: 1.19 mg/dL — ABNORMAL HIGH (ref 0.44–1.00)
GFR, Estimated: 51 mL/min — ABNORMAL LOW (ref >60)
Glucose, Bld: 166 mg/dL — ABNORMAL HIGH (ref 70–99)
Potassium: 3.4 mmol/L — ABNORMAL LOW (ref 3.5–5.1)
Sodium: 130 mmol/L — ABNORMAL LOW (ref 135–145)

## 2022-01-27 LAB — CBC
HCT: 33.7 % — ABNORMAL LOW (ref 36.0–46.0)
Hemoglobin: 11.5 g/dL — ABNORMAL LOW (ref 12.0–15.0)
MCH: 30.6 pg (ref 26.0–34.0)
MCHC: 34.1 g/dL (ref 30.0–36.0)
MCV: 89.6 fL (ref 80.0–100.0)
Platelets: 286 10*3/uL (ref 150–400)
RBC: 3.76 MIL/uL — ABNORMAL LOW (ref 3.87–5.11)
RDW: 12.5 % (ref 11.5–15.5)
WBC: 8.4 10*3/uL (ref 4.0–10.5)
nRBC: 0 % (ref 0.0–0.2)

## 2022-01-27 LAB — SURGICAL PCR SCREEN
MRSA, PCR: NEGATIVE
Staphylococcus aureus: POSITIVE — AB

## 2022-01-27 LAB — HEMOGLOBIN A1C
Hgb A1c MFr Bld: 6.7 % — ABNORMAL HIGH (ref 4.8–5.6)
Mean Plasma Glucose: 145.59 mg/dL

## 2022-01-27 LAB — GLUCOSE, CAPILLARY: Glucose-Capillary: 173 mg/dL — ABNORMAL HIGH (ref 70–99)

## 2022-02-06 ENCOUNTER — Ambulatory Visit: Admit: 2022-02-06 | Payer: Medicare Other | Admitting: Orthopaedic Surgery

## 2022-02-06 ENCOUNTER — Encounter (HOSPITAL_COMMUNITY)
Admission: RE | Admit: 2022-02-06 | Discharge: 2022-02-06 | Disposition: A | Discharge status: Home / Self Care | Payer: Medicare Other | Source: Ambulatory Visit | Attending: Orthopedic Surgery | Admitting: Orthopedic Surgery

## 2022-02-06 ENCOUNTER — Other Ambulatory Visit: Payer: Self-pay

## 2022-02-06 DIAGNOSIS — Z01812 Encounter for preprocedural laboratory examination: Secondary | ICD-10-CM | POA: Insufficient documentation

## 2022-02-06 DIAGNOSIS — Z01818 Encounter for other preprocedural examination: Secondary | ICD-10-CM

## 2022-02-06 DIAGNOSIS — Z20822 Contact with and (suspected) exposure to covid-19: Secondary | ICD-10-CM | POA: Insufficient documentation

## 2022-02-06 LAB — SARS CORONAVIRUS 2 (TAT 6-24 HRS): SARS Coronavirus 2: NEGATIVE

## 2022-02-06 SURGERY — ARTHROPLASTY, HIP, TOTAL, ANTERIOR APPROACH
Anesthesia: Spinal | Site: Hip | Laterality: Right

## 2022-02-07 ENCOUNTER — Encounter: Payer: Medicare Other | Admitting: Orthopaedic Surgery

## 2022-02-07 NOTE — Anesthesia Preprocedure Evaluation (Addendum)
Anesthesia Evaluation  Patient identified by MRN, date of birth, ID band Patient awake    Reviewed: Allergy & Precautions, NPO status , Patient's Chart, lab work & pertinent test results  Airway Mallampati: II  TM Distance: >3 FB Neck ROM: Full    Dental no notable dental hx. (+) Teeth Intact, Dental Advisory Given   Pulmonary asthma , sleep apnea ,    Pulmonary exam normal breath sounds clear to auscultation       Cardiovascular hypertension, Normal cardiovascular exam Rhythm:Regular Rate:Normal     Neuro/Psych PSYCHIATRIC DISORDERS Anxiety Bipolar Disorder    GI/Hepatic GERD  Medicated and Controlled,  Endo/Other  diabetes, Type 2  Renal/GU Lab Results      Component                Value               Date                      CREATININE               1.19 (H)            01/27/2022                BUN                      12                  01/27/2022                NA                       130 (L)             01/27/2022                K                        3.4 (L)             01/27/2022                CL                       96 (L)              01/27/2022                CO2                      20 (L)              01/27/2022                Musculoskeletal  (+) Arthritis , Osteoarthritis,    Abdominal   Peds  Hematology  (+) Blood dyscrasia, anemia , Lab Results      Component                Value               Date                      WBC                      8.4  01/27/2022                HGB                      11.5 (L)            01/27/2022                HCT                      33.7 (L)            01/27/2022                MCV                      89.6                01/27/2022                PLT                      286                 01/27/2022              Anesthesia Other Findings   Reproductive/Obstetrics                            Anesthesia  Physical Anesthesia Plan  ASA: 3  Anesthesia Plan: Spinal   Post-op Pain Management: Regional block* and Minimal or no pain anticipated   Induction:   PONV Risk Score and Plan: Treatment may vary due to age or medical condition, Ondansetron and Midazolam  Airway Management Planned: Natural Airway and Simple Face Mask  Additional Equipment: None  Intra-op Plan:   Post-operative Plan:   Informed Consent: I have reviewed the patients History and Physical, chart, labs and discussed the procedure including the risks, benefits and alternatives for the proposed anesthesia with the patient or authorized representative who has indicated his/her understanding and acceptance.     Dental advisory given  Plan Discussed with: CRNA  Anesthesia Plan Comments: (Spnal)       Anesthesia Quick Evaluation

## 2022-02-08 ENCOUNTER — Other Ambulatory Visit: Payer: Self-pay

## 2022-02-08 ENCOUNTER — Ambulatory Visit (HOSPITAL_COMMUNITY): Payer: Medicare Other

## 2022-02-08 ENCOUNTER — Ambulatory Visit (HOSPITAL_BASED_OUTPATIENT_CLINIC_OR_DEPARTMENT_OTHER): Payer: Medicare Other | Admitting: Anesthesiology

## 2022-02-08 ENCOUNTER — Encounter (HOSPITAL_COMMUNITY): Payer: Self-pay | Admitting: Orthopedic Surgery

## 2022-02-08 ENCOUNTER — Encounter (HOSPITAL_COMMUNITY)
Admission: RE | Disposition: A | Discharge status: Home / Self Care | Payer: Self-pay | Source: Ambulatory Visit | Attending: Orthopedic Surgery

## 2022-02-08 ENCOUNTER — Ambulatory Visit (HOSPITAL_COMMUNITY): Payer: Medicare Other | Admitting: Physician Assistant

## 2022-02-08 ENCOUNTER — Inpatient Hospital Stay (HOSPITAL_COMMUNITY)
Admission: RE | Admit: 2022-02-08 | Discharge: 2022-02-11 | DRG: 470 | Disposition: A | Discharge status: Home / Self Care | Payer: Medicare Other | Source: Ambulatory Visit | Attending: Orthopedic Surgery | Admitting: Orthopedic Surgery

## 2022-02-08 DIAGNOSIS — Z09 Encounter for follow-up examination after completed treatment for conditions other than malignant neoplasm: Secondary | ICD-10-CM

## 2022-02-08 DIAGNOSIS — Z7951 Long term (current) use of inhaled steroids: Secondary | ICD-10-CM

## 2022-02-08 DIAGNOSIS — K219 Gastro-esophageal reflux disease without esophagitis: Secondary | ICD-10-CM

## 2022-02-08 DIAGNOSIS — F988 Other specified behavioral and emotional disorders with onset usually occurring in childhood and adolescence: Secondary | ICD-10-CM | POA: Diagnosis present

## 2022-02-08 DIAGNOSIS — E0590 Thyrotoxicosis, unspecified without thyrotoxic crisis or storm: Secondary | ICD-10-CM | POA: Diagnosis present

## 2022-02-08 DIAGNOSIS — Z7984 Long term (current) use of oral hypoglycemic drugs: Secondary | ICD-10-CM

## 2022-02-08 DIAGNOSIS — E119 Type 2 diabetes mellitus without complications: Secondary | ICD-10-CM

## 2022-02-08 DIAGNOSIS — Z7983 Long term (current) use of bisphosphonates: Secondary | ICD-10-CM

## 2022-02-08 DIAGNOSIS — Z885 Allergy status to narcotic agent status: Secondary | ICD-10-CM

## 2022-02-08 DIAGNOSIS — D62 Acute posthemorrhagic anemia: Secondary | ICD-10-CM | POA: Diagnosis not present

## 2022-02-08 DIAGNOSIS — I10 Essential (primary) hypertension: Secondary | ICD-10-CM

## 2022-02-08 DIAGNOSIS — M1611 Unilateral primary osteoarthritis, right hip: Secondary | ICD-10-CM | POA: Diagnosis not present

## 2022-02-08 DIAGNOSIS — F4310 Post-traumatic stress disorder, unspecified: Secondary | ICD-10-CM | POA: Diagnosis present

## 2022-02-08 DIAGNOSIS — Z8249 Family history of ischemic heart disease and other diseases of the circulatory system: Secondary | ICD-10-CM

## 2022-02-08 DIAGNOSIS — Z419 Encounter for procedure for purposes other than remedying health state, unspecified: Secondary | ICD-10-CM

## 2022-02-08 DIAGNOSIS — Z01818 Encounter for other preprocedural examination: Secondary | ICD-10-CM

## 2022-02-08 DIAGNOSIS — K900 Celiac disease: Secondary | ICD-10-CM | POA: Diagnosis present

## 2022-02-08 DIAGNOSIS — Z79899 Other long term (current) drug therapy: Secondary | ICD-10-CM

## 2022-02-08 DIAGNOSIS — G4733 Obstructive sleep apnea (adult) (pediatric): Secondary | ICD-10-CM | POA: Diagnosis present

## 2022-02-08 DIAGNOSIS — Z20822 Contact with and (suspected) exposure to covid-19: Secondary | ICD-10-CM | POA: Diagnosis present

## 2022-02-08 DIAGNOSIS — F319 Bipolar disorder, unspecified: Secondary | ICD-10-CM | POA: Diagnosis present

## 2022-02-08 DIAGNOSIS — E7800 Pure hypercholesterolemia, unspecified: Secondary | ICD-10-CM | POA: Diagnosis present

## 2022-02-08 DIAGNOSIS — F419 Anxiety disorder, unspecified: Secondary | ICD-10-CM | POA: Diagnosis present

## 2022-02-08 HISTORY — DX: Anemia, unspecified: D64.9

## 2022-02-08 HISTORY — DX: Unspecified osteoarthritis, unspecified site: M19.90

## 2022-02-08 HISTORY — PX: TOTAL HIP ARTHROPLASTY: SHX124

## 2022-02-08 HISTORY — DX: Pneumonia, unspecified organism: J18.9

## 2022-02-08 HISTORY — DX: Gastro-esophageal reflux disease without esophagitis: K21.9

## 2022-02-08 LAB — GLUCOSE, CAPILLARY
Glucose-Capillary: 186 mg/dL — ABNORMAL HIGH (ref 70–99)
Glucose-Capillary: 116 mg/dL — ABNORMAL HIGH (ref 70–99)
Glucose-Capillary: 189 mg/dL — ABNORMAL HIGH (ref 70–99)

## 2022-02-08 LAB — ABO/RH: ABO/RH(D): A POS

## 2022-02-08 SURGERY — ARTHROPLASTY, HIP, TOTAL, ANTERIOR APPROACH
Anesthesia: Spinal | Site: Hip | Laterality: Right

## 2022-02-08 MED ORDER — ALUM & MAG HYDROXIDE-SIMETH 200-200-20 MG/5ML PO SUSP: 30.0000 mL | ORAL | Status: DC | PRN

## 2022-02-08 MED ORDER — ALBUTEROL SULFATE HFA 108 (90 BASE) MCG/ACT IN AERS: 2.0000 | INHALATION_SPRAY | Freq: Four times a day (QID) | RESPIRATORY_TRACT | Status: DC | PRN

## 2022-02-08 MED ORDER — ASPIRIN 81 MG PO CHEW
81.0000 mg | CHEWABLE_TABLET | Freq: Two times a day (BID) | ORAL | Status: DC
  Administered 2022-02-08 – 2022-02-11 (×6): 81 mg via ORAL
  Filled 2022-02-08 (×6): qty 1

## 2022-02-08 MED ORDER — FENTANYL CITRATE PF 50 MCG/ML IJ SOSY
25.0000 ug | PREFILLED_SYRINGE | INTRAMUSCULAR | Status: DC | PRN
  Administered 2022-02-08 (×2): 50 ug via INTRAVENOUS

## 2022-02-08 MED ORDER — DOCUSATE SODIUM 100 MG PO CAPS
100.0000 mg | ORAL_CAPSULE | Freq: Two times a day (BID) | ORAL | Status: DC
  Administered 2022-02-08 – 2022-02-11 (×6): 100 mg via ORAL
  Filled 2022-02-08 (×6): qty 1

## 2022-02-08 MED ORDER — PHENYLEPHRINE HCL (PRESSORS) 10 MG/ML IV SOLN
INTRAVENOUS | Status: AC
  Filled 2022-02-08: qty 1

## 2022-02-08 MED ORDER — MIDAZOLAM HCL 2 MG/2ML IJ SOLN
INTRAMUSCULAR | Status: AC
  Filled 2022-02-08: qty 2

## 2022-02-08 MED ORDER — PHENYLEPHRINE 40 MCG/ML (10ML) SYRINGE FOR IV PUSH (FOR BLOOD PRESSURE SUPPORT)
PREFILLED_SYRINGE | INTRAVENOUS | Status: AC
  Filled 2022-02-08: qty 20

## 2022-02-08 MED ORDER — ONDANSETRON HCL 4 MG/2ML IJ SOLN: 4.0000 mg | Freq: Four times a day (QID) | INTRAMUSCULAR | Status: DC | PRN

## 2022-02-08 MED ORDER — DIPHENHYDRAMINE HCL 50 MG/ML IJ SOLN
6.2500 mg | Freq: Once | INTRAMUSCULAR | Status: AC
  Administered 2022-02-08: 6.5 mg via INTRAVENOUS

## 2022-02-08 MED ORDER — SENNA 8.6 MG PO TABS
1.0000 | ORAL_TABLET | Freq: Two times a day (BID) | ORAL | Status: DC
  Administered 2022-02-08 – 2022-02-11 (×6): 8.6 mg via ORAL
  Filled 2022-02-08 (×6): qty 1

## 2022-02-08 MED ORDER — ALBUTEROL SULFATE (2.5 MG/3ML) 0.083% IN NEBU: 2.5000 mg | INHALATION_SOLUTION | Freq: Four times a day (QID) | RESPIRATORY_TRACT | Status: DC | PRN

## 2022-02-08 MED ORDER — CHLORHEXIDINE GLUCONATE 0.12 % MT SOLN
15.0000 mL | Freq: Once | OROMUCOSAL | Status: AC
  Administered 2022-02-08: 15 mL via OROMUCOSAL

## 2022-02-08 MED ORDER — ONDANSETRON HCL 4 MG PO TABS: 4.0000 mg | ORAL_TABLET | Freq: Four times a day (QID) | ORAL | Status: DC | PRN

## 2022-02-08 MED ORDER — SODIUM CHLORIDE 0.9 % IV SOLN: INTRAVENOUS | Status: DC

## 2022-02-08 MED ORDER — INSULIN ASPART 100 UNIT/ML IJ SOLN
0.0000 [IU] | Freq: Three times a day (TID) | INTRAMUSCULAR | Status: DC
  Administered 2022-02-09: 5 [IU] via SUBCUTANEOUS
  Administered 2022-02-09 – 2022-02-11 (×7): 2 [IU] via SUBCUTANEOUS

## 2022-02-08 MED ORDER — CEFAZOLIN SODIUM-DEXTROSE 2-4 GM/100ML-% IV SOLN
2.0000 g | Freq: Four times a day (QID) | INTRAVENOUS | Status: AC
  Administered 2022-02-08 (×2): 2 g via INTRAVENOUS
  Filled 2022-02-08 (×2): qty 100

## 2022-02-08 MED ORDER — MIDAZOLAM HCL 2 MG/2ML IJ SOLN
INTRAMUSCULAR | Status: DC | PRN
  Administered 2022-02-08: 2 mg via INTRAVENOUS

## 2022-02-08 MED ORDER — METHOCARBAMOL 500 MG PO TABS
500.0000 mg | ORAL_TABLET | Freq: Four times a day (QID) | ORAL | Status: DC | PRN
  Administered 2022-02-08 – 2022-02-11 (×7): 500 mg via ORAL
  Filled 2022-02-08 (×7): qty 1

## 2022-02-08 MED ORDER — PROPOFOL 10 MG/ML IV BOLUS
INTRAVENOUS | Status: AC
  Filled 2022-02-08: qty 20

## 2022-02-08 MED ORDER — MENTHOL 3 MG MT LOZG: 1.0000 | LOZENGE | OROMUCOSAL | Status: DC | PRN

## 2022-02-08 MED ORDER — MAGNESIUM OXIDE -MG SUPPLEMENT 400 (240 MG) MG PO TABS
400.0000 mg | ORAL_TABLET | Freq: Every day | ORAL | Status: DC
  Administered 2022-02-09 – 2022-02-11 (×3): 400 mg via ORAL
  Filled 2022-02-08 (×4): qty 1

## 2022-02-08 MED ORDER — PHENOL 1.4 % MT LIQD: 1.0000 | OROMUCOSAL | Status: DC | PRN

## 2022-02-08 MED ORDER — FENTANYL CITRATE (PF) 100 MCG/2ML IJ SOLN
INTRAMUSCULAR | Status: AC
  Filled 2022-02-08: qty 2

## 2022-02-08 MED ORDER — MORPHINE SULFATE (PF) 2 MG/ML IV SOLN: 0.5000 mg | INTRAVENOUS | Status: DC | PRN

## 2022-02-08 MED ORDER — GLIPIZIDE 5 MG PO TABS
2.5000 mg | ORAL_TABLET | Freq: Two times a day (BID) | ORAL | Status: DC
Start: 2022-02-08 — End: 2022-02-11
  Administered 2022-02-08 – 2022-02-11 (×6): 2.5 mg via ORAL
  Filled 2022-02-08 (×6): qty 1

## 2022-02-08 MED ORDER — TRANEXAMIC ACID-NACL 1000-0.7 MG/100ML-% IV SOLN
1000.0000 mg | INTRAVENOUS | Status: AC
  Administered 2022-02-08: 1000 mg via INTRAVENOUS
  Filled 2022-02-08: qty 100

## 2022-02-08 MED ORDER — BUPIVACAINE-EPINEPHRINE 0.25% -1:200000 IJ SOLN
INTRAMUSCULAR | Status: DC | PRN
  Administered 2022-02-08: 30 mL

## 2022-02-08 MED ORDER — BACLOFEN 10 MG PO TABS
5.0000 mg | ORAL_TABLET | Freq: Three times a day (TID) | ORAL | Status: DC
  Administered 2022-02-08 – 2022-02-11 (×9): 5 mg via ORAL
  Filled 2022-02-08 (×9): qty 1

## 2022-02-08 MED ORDER — PHENYLEPHRINE 40 MCG/ML (10ML) SYRINGE FOR IV PUSH (FOR BLOOD PRESSURE SUPPORT)
PREFILLED_SYRINGE | INTRAVENOUS | Status: DC | PRN
  Administered 2022-02-08 (×5): 80 ug via INTRAVENOUS

## 2022-02-08 MED ORDER — PHENYLEPHRINE HCL-NACL 20-0.9 MG/250ML-% IV SOLN
INTRAVENOUS | Status: DC | PRN
Start: 2022-02-08 — End: 2022-02-08
  Administered 2022-02-08: 40 ug/min via INTRAVENOUS

## 2022-02-08 MED ORDER — DIPHENHYDRAMINE HCL 50 MG/ML IJ SOLN
INTRAMUSCULAR | Status: AC
  Filled 2022-02-08: qty 1

## 2022-02-08 MED ORDER — BUDESONIDE 0.25 MG/2ML IN SUSP
0.2500 mg | Freq: Two times a day (BID) | RESPIRATORY_TRACT | Status: DC
Start: 2022-02-08 — End: 2022-02-11
  Administered 2022-02-08 – 2022-02-11 (×6): 0.25 mg via RESPIRATORY_TRACT
  Filled 2022-02-08 (×6): qty 2

## 2022-02-08 MED ORDER — ORAL CARE MOUTH RINSE: 15.0000 mL | Freq: Once | OROMUCOSAL | Status: AC

## 2022-02-08 MED ORDER — CEFAZOLIN SODIUM-DEXTROSE 2-4 GM/100ML-% IV SOLN
2.0000 g | INTRAVENOUS | Status: AC
  Administered 2022-02-08: 2 g via INTRAVENOUS
  Filled 2022-02-08: qty 100

## 2022-02-08 MED ORDER — OXYCODONE HCL 5 MG PO TABS
10.0000 mg | ORAL_TABLET | ORAL | Status: DC | PRN
  Administered 2022-02-08 – 2022-02-11 (×13): 10 mg via ORAL
  Filled 2022-02-08 (×13): qty 2

## 2022-02-08 MED ORDER — WATER FOR IRRIGATION, STERILE IR SOLN
Status: DC | PRN
  Administered 2022-02-08: 1000 mL

## 2022-02-08 MED ORDER — METOCLOPRAMIDE HCL 5 MG/ML IJ SOLN: 5.0000 mg | Freq: Three times a day (TID) | INTRAMUSCULAR | Status: DC | PRN

## 2022-02-08 MED ORDER — ATORVASTATIN CALCIUM 40 MG PO TABS
80.0000 mg | ORAL_TABLET | Freq: Every day | ORAL | Status: DC
  Administered 2022-02-08 – 2022-02-11 (×4): 80 mg via ORAL
  Filled 2022-02-08 (×4): qty 2

## 2022-02-08 MED ORDER — PANTOPRAZOLE SODIUM 40 MG PO TBEC
40.0000 mg | DELAYED_RELEASE_TABLET | Freq: Every day | ORAL | Status: DC
  Administered 2022-02-08 – 2022-02-11 (×4): 40 mg via ORAL
  Filled 2022-02-08 (×4): qty 1

## 2022-02-08 MED ORDER — PROPOFOL 500 MG/50ML IV EMUL
INTRAVENOUS | Status: DC | PRN
  Administered 2022-02-08: 80 ug/kg/min via INTRAVENOUS

## 2022-02-08 MED ORDER — SODIUM CHLORIDE (PF) 0.9 % IJ SOLN
INTRAMUSCULAR | Status: DC | PRN
  Administered 2022-02-08: 30 mL

## 2022-02-08 MED ORDER — FENTANYL CITRATE PF 50 MCG/ML IJ SOSY
PREFILLED_SYRINGE | INTRAMUSCULAR | Status: AC
  Administered 2022-02-08: 50 ug via INTRAVENOUS
  Filled 2022-02-08: qty 3

## 2022-02-08 MED ORDER — ACETAMINOPHEN 10 MG/ML IV SOLN: 1000.0000 mg | Freq: Once | INTRAVENOUS | Status: DC | PRN

## 2022-02-08 MED ORDER — HYDROCODONE-ACETAMINOPHEN 5-325 MG PO TABS: 1.0000 | ORAL_TABLET | ORAL | Status: DC | PRN

## 2022-02-08 MED ORDER — METHOCARBAMOL 1000 MG/10ML IJ SOLN
500.0000 mg | Freq: Four times a day (QID) | INTRAVENOUS | Status: DC | PRN
  Filled 2022-02-08: qty 5

## 2022-02-08 MED ORDER — SUMATRIPTAN SUCCINATE 50 MG PO TABS
50.0000 mg | ORAL_TABLET | ORAL | Status: DC | PRN
  Filled 2022-02-08: qty 1

## 2022-02-08 MED ORDER — HYDROMORPHONE HCL 1 MG/ML IJ SOLN
1.0000 mg | INTRAMUSCULAR | Status: DC | PRN
  Administered 2022-02-08 (×2): 1 mg via INTRAVENOUS
  Administered 2022-02-09 (×2): 2 mg via INTRAVENOUS
  Filled 2022-02-08: qty 1
  Filled 2022-02-08 (×2): qty 2
  Filled 2022-02-08: qty 1

## 2022-02-08 MED ORDER — LACTATED RINGERS IV SOLN: INTRAVENOUS | Status: DC

## 2022-02-08 MED ORDER — LIDOCAINE HCL (PF) 0.5 % IJ SOLN
INTRAMUSCULAR | Status: DC | PRN
  Administered 2022-02-08: 2 mL via INTRATHECAL

## 2022-02-08 MED ORDER — KETOROLAC TROMETHAMINE 30 MG/ML IJ SOLN
INTRAMUSCULAR | Status: DC | PRN
  Administered 2022-02-08: 30 mg

## 2022-02-08 MED ORDER — POLYETHYLENE GLYCOL 3350 17 G PO PACK: 17.0000 g | PACK | Freq: Every day | ORAL | Status: DC | PRN

## 2022-02-08 MED ORDER — SODIUM CHLORIDE 0.9 % IR SOLN
Status: DC | PRN
  Administered 2022-02-08 (×3): 1000 mL

## 2022-02-08 MED ORDER — LIDOCAINE HCL (CARDIAC) PF 100 MG/5ML IV SOSY
PREFILLED_SYRINGE | INTRAVENOUS | Status: DC | PRN
  Administered 2022-02-08: 40 mg via INTRAVENOUS

## 2022-02-08 MED ORDER — ISOPROPYL ALCOHOL 70 % SOLN
Status: DC | PRN
Start: 2022-02-08 — End: 2022-02-08
  Administered 2022-02-08: 1 via TOPICAL

## 2022-02-08 MED ORDER — PROPOFOL 1000 MG/100ML IV EMUL
INTRAVENOUS | Status: AC
  Filled 2022-02-08: qty 100

## 2022-02-08 MED ORDER — GABAPENTIN 300 MG PO CAPS
600.0000 mg | ORAL_CAPSULE | Freq: Three times a day (TID) | ORAL | Status: DC
  Administered 2022-02-08 – 2022-02-11 (×9): 600 mg via ORAL
  Filled 2022-02-08 (×9): qty 2

## 2022-02-08 MED ORDER — HYDROXYZINE HCL 25 MG PO TABS
50.0000 mg | ORAL_TABLET | Freq: Three times a day (TID) | ORAL | Status: DC | PRN
  Administered 2022-02-08 – 2022-02-11 (×2): 50 mg via ORAL
  Filled 2022-02-08 (×2): qty 2

## 2022-02-08 MED ORDER — KETOROLAC TROMETHAMINE 15 MG/ML IJ SOLN
15.0000 mg | Freq: Four times a day (QID) | INTRAMUSCULAR | Status: AC
  Administered 2022-02-08 – 2022-02-09 (×4): 15 mg via INTRAVENOUS
  Filled 2022-02-08 (×4): qty 1

## 2022-02-08 MED ORDER — DIPHENHYDRAMINE HCL 12.5 MG/5ML PO ELIX
12.5000 mg | ORAL_SOLUTION | ORAL | Status: DC | PRN
  Administered 2022-02-09: 25 mg via ORAL
  Filled 2022-02-08: qty 10

## 2022-02-08 MED ORDER — DEXMEDETOMIDINE (PRECEDEX) IN NS 20 MCG/5ML (4 MCG/ML) IV SYRINGE
PREFILLED_SYRINGE | INTRAVENOUS | Status: AC
  Filled 2022-02-08: qty 10

## 2022-02-08 MED ORDER — POVIDONE-IODINE 10 % EX SWAB: 2.0000 "application " | Freq: Once | CUTANEOUS | Status: DC

## 2022-02-08 MED ORDER — POVIDONE-IODINE 10 % EX SWAB
2.0000 "application " | Freq: Once | CUTANEOUS | Status: AC
  Administered 2022-02-08: 2 via TOPICAL

## 2022-02-08 MED ORDER — LIDOCAINE HCL (PF) 2 % IJ SOLN
INTRAMUSCULAR | Status: AC
  Filled 2022-02-08: qty 5

## 2022-02-08 MED ORDER — HYDROCODONE-ACETAMINOPHEN 7.5-325 MG PO TABS
1.0000 | ORAL_TABLET | ORAL | Status: DC | PRN
  Administered 2022-02-08: 2 via ORAL
  Filled 2022-02-08: qty 2

## 2022-02-08 MED ORDER — METOCLOPRAMIDE HCL 5 MG PO TABS: 5.0000 mg | ORAL_TABLET | Freq: Three times a day (TID) | ORAL | Status: DC | PRN

## 2022-02-08 MED ORDER — ONDANSETRON HCL 4 MG/2ML IJ SOLN: 4.0000 mg | Freq: Once | INTRAMUSCULAR | Status: DC | PRN

## 2022-02-08 MED ORDER — INSULIN ASPART 100 UNIT/ML IJ SOLN: 0.0000 [IU] | Freq: Every day | INTRAMUSCULAR | Status: DC

## 2022-02-08 MED ORDER — FENTANYL CITRATE (PF) 100 MCG/2ML IJ SOLN
INTRAMUSCULAR | Status: DC | PRN
  Administered 2022-02-08 (×2): 50 ug via INTRAVENOUS

## 2022-02-08 MED ORDER — ONDANSETRON HCL 4 MG/2ML IJ SOLN
INTRAMUSCULAR | Status: DC | PRN
  Administered 2022-02-08: 4 mg via INTRAVENOUS

## 2022-02-08 MED ORDER — PROPOFOL 10 MG/ML IV BOLUS
INTRAVENOUS | Status: DC | PRN
  Administered 2022-02-08: 20 mg via INTRAVENOUS
  Administered 2022-02-08: 40 mg via INTRAVENOUS
  Administered 2022-02-08: 20 mg via INTRAVENOUS
  Administered 2022-02-08: 40 mg via INTRAVENOUS
  Administered 2022-02-08 (×2): 20 mg via INTRAVENOUS

## 2022-02-08 MED ORDER — ACETAMINOPHEN 325 MG PO TABS
325.0000 mg | ORAL_TABLET | Freq: Four times a day (QID) | ORAL | Status: DC | PRN
  Administered 2022-02-09: 650 mg via ORAL
  Administered 2022-02-10: 325 mg via ORAL
  Administered 2022-02-11: 650 mg via ORAL
  Filled 2022-02-08 (×3): qty 2

## 2022-02-08 MED ORDER — DEXMEDETOMIDINE (PRECEDEX) IN NS 20 MCG/5ML (4 MCG/ML) IV SYRINGE
PREFILLED_SYRINGE | INTRAVENOUS | Status: DC | PRN
  Administered 2022-02-08: 8 ug via INTRAVENOUS

## 2022-02-08 SURGICAL SUPPLY — 59 items
ADH SKN CLS APL DERMABOND .7 (GAUZE/BANDAGES/DRESSINGS) ×2
APL PRP STRL LF DISP 70% ISPRP (MISCELLANEOUS) ×1
BAG COUNTER SPONGE SURGICOUNT (BAG) IMPLANT
BAG SPEC THK2 15X12 ZIP CLS (MISCELLANEOUS)
BAG SPNG CNTER NS LX DISP (BAG)
BAG ZIPLOCK 12X15 (MISCELLANEOUS) IMPLANT
CHLORAPREP W/TINT 26 (MISCELLANEOUS) ×2 IMPLANT
COVER PERINEAL POST (MISCELLANEOUS) ×2 IMPLANT
COVER SURGICAL LIGHT HANDLE (MISCELLANEOUS) ×2 IMPLANT
DERMABOND ADVANCED (GAUZE/BANDAGES/DRESSINGS) ×2
DERMABOND ADVANCED .7 DNX12 (GAUZE/BANDAGES/DRESSINGS) ×2 IMPLANT
DRAPE IMP U-DRAPE 54X76 (DRAPES) ×2 IMPLANT
DRAPE SHEET LG 3/4 BI-LAMINATE (DRAPES) ×6 IMPLANT
DRAPE STERI IOBAN 125X83 (DRAPES) ×2 IMPLANT
DRAPE U-SHAPE 47X51 STRL (DRAPES) ×4 IMPLANT
DRSG AQUACEL AG ADV 3.5X10 (GAUZE/BANDAGES/DRESSINGS) ×2 IMPLANT
ELECT REM PT RETURN 15FT ADLT (MISCELLANEOUS) ×2 IMPLANT
GAUZE SPONGE 4X4 12PLY STRL (GAUZE/BANDAGES/DRESSINGS) ×2 IMPLANT
GLOVE SRG 8 PF TXTR STRL LF DI (GLOVE) ×1 IMPLANT
GLOVE SURG ENC MOIS LTX SZ8.5 (GLOVE) ×4 IMPLANT
GLOVE SURG ENC TEXT LTX SZ7.5 (GLOVE) ×4 IMPLANT
GLOVE SURG UNDER POLY LF SZ8 (GLOVE) ×2
GLOVE SURG UNDER POLY LF SZ8.5 (GLOVE) ×2 IMPLANT
GOWN SPEC L3 XXLG W/TWL (GOWN DISPOSABLE) ×2 IMPLANT
GOWN STRL REUS W/TWL XL LVL3 (GOWN DISPOSABLE) ×2 IMPLANT
HANDPIECE INTERPULSE COAX TIP (DISPOSABLE) ×2
HEAD FEM 32X0 DELTA TI (Head) ×1 IMPLANT
HOLDER FOLEY CATH W/STRAP (MISCELLANEOUS) ×2 IMPLANT
HOOD PEEL AWAY FLYTE STAYCOOL (MISCELLANEOUS) ×6 IMPLANT
KIT TURNOVER KIT A (KITS) IMPLANT
LINER ACETAB NEUTRAL 7/C 32 (Liner) ×1 IMPLANT
MANIFOLD NEPTUNE II (INSTRUMENTS) ×2 IMPLANT
MARKER SKIN DUAL TIP RULER LAB (MISCELLANEOUS) ×2 IMPLANT
NDL SAFETY ECLIPSE 18X1.5 (NEEDLE) ×1 IMPLANT
NDL SPNL 18GX3.5 QUINCKE PK (NEEDLE) ×1 IMPLANT
NEEDLE HYPO 18GX1.5 SHARP (NEEDLE) ×2
NEEDLE SPNL 18GX3.5 QUINCKE PK (NEEDLE) ×2 IMPLANT
PACK ANTERIOR HIP CUSTOM (KITS) ×2 IMPLANT
PENCIL SMOKE EVACUATOR (MISCELLANEOUS) IMPLANT
SAW OSC TIP CART 19.5X105X1.3 (SAW) ×2 IMPLANT
SEALER BIPOLAR AQUA 6.0 (INSTRUMENTS) ×2 IMPLANT
SET HNDPC FAN SPRY TIP SCT (DISPOSABLE) ×1 IMPLANT
SHELL ACET G7 3H 48 SZC (Shell) ×1 IMPLANT
SOLUTION PRONTOSAN WOUND 350ML (IRRIGATION / IRRIGATOR) ×2 IMPLANT
SPIKE FLUID TRANSFER (MISCELLANEOUS) ×2 IMPLANT
SPONGE T-LAP 18X18 ~~LOC~~+RFID (SPONGE) ×7 IMPLANT
STAPLER INSORB 30 2030 C-SECTI (MISCELLANEOUS) IMPLANT
STEM FEM CMTLS 9X102.5 133D (Stem) ×1 IMPLANT
SUT MNCRL AB 3-0 PS2 18 (SUTURE) ×2 IMPLANT
SUT MON AB 2-0 CT1 36 (SUTURE) ×2 IMPLANT
SUT STRATAFIX PDO 1 14 VIOLET (SUTURE) ×2
SUT STRATFX PDO 1 14 VIOLET (SUTURE) ×1
SUT VIC AB 2-0 CT1 27 (SUTURE)
SUT VIC AB 2-0 CT1 TAPERPNT 27 (SUTURE) IMPLANT
SUTURE STRATFX PDO 1 14 VIOLET (SUTURE) ×1 IMPLANT
SYR 3ML LL SCALE MARK (SYRINGE) ×2 IMPLANT
TRAY FOLEY MTR SLVR 14FR STAT (SET/KITS/TRAYS/PACK) ×1 IMPLANT
TUBE SUCTION HIGH CAP CLEAR NV (SUCTIONS) ×2 IMPLANT
WATER STERILE IRR 1000ML POUR (IV SOLUTION) ×2 IMPLANT

## 2022-02-08 NOTE — Discharge Instructions (Signed)
? ?Dr. Arshia Rondon ?Joint Replacement Specialist ?[facility] ?[address] ?[place], [place] [address] ?[phone] ? ? ?TOTAL HIP REPLACEMENT POSTOPERATIVE DIRECTIONS ? ? ? ?Hip Rehabilitation, Guidelines Following Surgery  ? ?WEIGHT BEARING ?Weight bearing as tolerated with assist device (walker, cane, etc) as directed, use it as long as suggested by your surgeon or therapist, typically at least 4-6 weeks. ? ?The results of a hip operation are greatly improved after range of motion and muscle strengthening exercises. Follow all safety measures which are given to protect your hip. If any of these exercises cause increased pain or swelling in your joint, decrease the amount until you are comfortable again. Then slowly increase the exercises. Call your caregiver if you have problems or questions.  ? ?HOME CARE INSTRUCTIONS  ?Most of the following instructions are designed to prevent the dislocation of your new hip.  ?Remove items at home which could result in a fall. This includes throw rugs or furniture in walking pathways.  ?Continue medications as instructed at time of discharge. ?You may have some home medications which will be placed on hold until you complete the course of blood thinner medication. ?You may start showering once you are discharged home. Do not remove your dressing. ?Do not put on socks or shoes without following the instructions of your caregivers.   ?Sit on chairs with arms. Use the chair arms to help push yourself up when arising.  ?Arrange for the use of a toilet seat elevator so you are not sitting low.  ?Walk with walker as instructed.  ?You may resume a sexual relationship in one month or when given the OK by your caregiver.  ?Use walker as long as suggested by your caregivers.  ?You may put full weight on your legs and walk as much as is comfortable. ?Avoid periods of inactivity such as sitting longer than an hour when not asleep. This helps prevent blood  clots.  ?You may return to work once you are cleared by your surgeon.  ?Do not drive a car for 6 weeks or until released by your surgeon.  ?Do not drive while taking narcotics.  ?Wear elastic stockings for two weeks following surgery during the day but you may remove then at night.  ?Make sure you keep all of your appointments after your operation with all of your doctors and caregivers. You should call the office at the above phone number and make an appointment for approximately two weeks after the date of your surgery. ?Please pick up a stool softener and laxative for home use as long as you are requiring pain medications. ?ICE to the affected hip every three hours for 30 minutes at a time and then as needed for pain and swelling. Continue to use ice on the hip for pain and swelling from surgery. You may notice swelling that will progress down to the foot and ankle.  This is normal after surgery.  Elevate the leg when you are not up walking on it.   ?It is important for you to complete the blood thinner medication as prescribed by your doctor. ?Continue to use the breathing machine which will help keep your temperature down.  It is common for your temperature to cycle up and down following surgery, especially at night when you are not up moving around and exerting yourself.  The breathing machine keeps your lungs expanded and your temperature down. ? ?RANGE OF MOTION AND STRENGTHENING EXERCISES  ?These exercises are designed to help you   keep full movement of your hip joint. Follow your caregiver's or physical therapist's instructions. Perform all exercises about fifteen times, three times per day or as directed. Exercise both hips, even if you have had only one joint replacement. These exercises can be done on a training (exercise) mat, on the floor, on a table or on a bed. Use whatever works the best and is most comfortable for you. Use music or television while you are exercising so that the exercises are a  pleasant break in your day. This will make your life better with the exercises acting as a break in routine you can look forward to.  ?Lying on your back, slowly slide your foot toward your buttocks, raising your knee up off the floor. Then slowly slide your foot back down until your leg is straight again.  ?Lying on your back spread your legs as far apart as you can without causing discomfort.  ?Lying on your side, raise your upper leg and foot straight up from the floor as far as is comfortable. Slowly lower the leg and repeat.  ?Lying on your back, tighten up the muscle in the front of your thigh (quadriceps muscles). You can do this by keeping your leg straight and trying to raise your heel off the floor. This helps strengthen the largest muscle supporting your knee.  ?Lying on your back, tighten up the muscles of your buttocks both with the legs straight and with the knee bent at a comfortable angle while keeping your heel on the floor.  ? ?SKILLED REHAB INSTRUCTIONS: ?If the patient is transferred to a skilled rehab facility following release from the hospital, a list of the current medications will be sent to the facility for the patient to continue.  When discharged from the skilled rehab facility, please have the facility set up the patient's Home Health Physical Therapy prior to being released. Also, the skilled facility will be responsible for providing the patient with their medications at time of release from the facility to include their pain medication and their blood thinner medication. If the patient is still at the rehab facility at time of the two week follow up appointment, the skilled rehab facility will also need to assist the patient in arranging follow up appointment in our office and any transportation needs. ? ?POST-OPERATIVE OPIOID TAPER INSTRUCTIONS: ?It is important to wean off of your opioid medication as soon as possible. If you do not need pain medication after your surgery it is ok  to stop day one. ?Opioids include: ?Codeine, Hydrocodone(Norco, Vicodin), Oxycodone(Percocet, oxycontin) and hydromorphone amongst others.  ?Long term and even short term use of opiods can cause: ?Increased pain response ?Dependence ?Constipation ?Depression ?Respiratory depression ?And more.  ?Withdrawal symptoms can include ?Flu like symptoms ?Nausea, vomiting ?And more ?Techniques to manage these symptoms ?Hydrate well ?Eat regular healthy meals ?Stay active ?Use relaxation techniques(deep breathing, meditating, yoga) ?Do Not substitute Alcohol to help with tapering ?If you have been on opioids for less than two weeks and do not have pain than it is ok to stop all together.  ?Plan to wean off of opioids ?This plan should start within one week post op of your joint replacement. ?Maintain the same interval or time between taking each dose and first decrease the dose.  ?Cut the total daily intake of opioids by one tablet each day ?Next start to increase the time between doses. ?The last dose that should be eliminated is the evening dose.  ? ? ?MAKE   SURE YOU:  ?Understand these instructions.  ?Will watch your condition.  ?Will get help right away if you are not doing well or get worse. ? ?Pick up stool softner and laxative for home use following surgery while on pain medications. ?Do not remove your dressing. ?The dressing is waterproof--it is OK to take showers. ?Continue to use ice for pain and swelling after surgery. ?Do not use any lotions or creams on the incision until instructed by your surgeon. ?Total Hip Protocol. ? ?

## 2022-02-08 NOTE — Op Note (Signed)
OPERATIVE REPORT ? ?SURGEON: Rod Can, MD  ? ?ASSISTANT: Nehemiah Massed, PA-C. ? ?PREOPERATIVE DIAGNOSIS: Right hip arthritis.  ? ?POSTOPERATIVE DIAGNOSIS: Right hip arthritis.  ? ?PROCEDURE: Right total hip arthroplasty, anterior approach.  ? ?IMPLANTS: Biomet Taperloc Complete Microplasty stem, size 9 x 102.5 mm, high offset. ?Biomet G7 OsseoTi Cup, size 48 mm. ?Biomet Vivacit-E liner, size 32 mm, C, neutral. ?Biomet Biolox ceramic head ball, size 32 + 0 mm. ? ?ANESTHESIA:  MAC and Spinal ? ?ESTIMATED BLOOD LOSS:-200 mL   ? ?ANTIBIOTICS: 2g Ancef. ? ?DRAINS: None. ? ?COMPLICATIONS: None. ?  ?CONDITION: PACU - hemodynamically stable.  ? ?BRIEF CLINICAL NOTE: Paige Andrews is a 66 y.o. female with a long-standing history of Right hip arthritis. After failing conservative management, the patient was indicated for total hip arthroplasty. The risks, benefits, and alternatives to the procedure were explained, and the patient elected to proceed. ? ?PROCEDURE IN DETAIL: Surgical site was marked by myself in the pre-op holding area. Once inside the operating room, spinal anesthesia was obtained, and a foley catheter was inserted. The patient was then positioned on the Hana table.  All bony prominences were well padded.  The hip was prepped and draped in the normal sterile surgical fashion.  A time-out was called verifying side and site of surgery. The patient received IV antibiotics within 60 minutes of beginning the procedure. ?  ?Bikini incision was made, and superficial dissection was performed lateral to the ASIS. The direct anterior approach to the hip was performed through the Hueter interval.  Lateral femoral circumflex vessels were treated with the Auqumantys. The anterior capsule was exposed and an inverted T capsulotomy was made. The femoral neck cut was made to the level of the templated cut.  A corkscrew was placed into the head and the head was removed.  The femoral head was found to have  eburnated bone. The head was passed to the back table and was measured. Pubofemoral ligament was released off of the calcar, taking care to stay on bone. Superior capsule was released from the greater trochanter, taking care to stay lateral to the posterior border of the femoral neck in order to preserve the short external rotators. ?  ?Acetabular exposure was achieved, and the pulvinar and labrum were excised. Sequential reaming of the acetabulum was then performed up to a size 47 mm reamer. A 48 mm cup was then opened and impacted into place at approximately 40 degrees of abduction and 20 degrees of anteversion. The final polyethylene liner was impacted into place and acetabular osteophytes were removed.  ?  ?I then gained femoral exposure taking care to protect the abductors and greater trochanter.  This was performed using standard external rotation, extension, and adduction.  A cookie cutter was used to enter the femoral canal, and then the femoral canal finder was placed.  Sequential broaching was performed up to a size 9.  Calcar planer was used on the femoral neck remnant.  I placed a high offset neck and a trial head ball.  The hip was reduced.  Leg lengths and offset were checked fluoroscopically.  The hip was dislocated and trial components were removed.  The final implants were placed, and the hip was reduced.  Fluoroscopy was used to confirm component position and leg lengths.  At 90 degrees of external rotation and full extension, the hip was stable to an anterior directed force. ?  ?The wound was copiously irrigated with Prontosan solution and normal saline using pule lavage.  Marcaine  solution was injected into the periarticular soft tissue.  The wound was closed in layers using #1 Vicryl and V-Loc for the fascia, 2-0 Vicryl for the subcutaneous fat, 2-0 Monocryl for the deep dermal layer, 3-0 running Monocryl subcuticular stitch, and Dermabond for the skin.  Once the glue was fully dried, an  Aquacell Ag dressing was applied.  The patient was transported to the recovery room in stable condition.  Sponge, needle, and instrument counts were correct at the end of the case x2.  The patient tolerated the procedure well and there were no known complications. ? ?Please note that a surgical assistant was a medical necessity for this procedure to perform it in a safe and expeditious manner. Assistant was necessary to provide appropriate retraction of vital neurovascular structures, to prevent femoral fracture, and to allow for anatomic placement of the prosthesis. ? ?

## 2022-02-08 NOTE — Anesthesia Postprocedure Evaluation (Signed)
Anesthesia Post Note ? ?Patient: Paige Andrews ? ?Procedure(s) Performed: TOTAL HIP ARTHROPLASTY ANTERIOR APPROACH (Right: Hip) ? ?  ? ?Patient location during evaluation: Nursing Unit ?Anesthesia Type: Spinal ?Level of consciousness: oriented and awake and alert ?Pain management: pain level controlled ?Vital Signs Assessment: post-procedure vital signs reviewed and stable ?Respiratory status: spontaneous breathing and respiratory function stable ?Cardiovascular status: blood pressure returned to baseline and stable ?Postop Assessment: no headache, no backache, no apparent nausea or vomiting and patient able to bend at knees ?Anesthetic complications: no ? ? ?No notable events documented. ? ?Last Vitals:  ?Vitals:  ? 02/08/22 0845 02/08/22 1400  ?BP: (!) 152/78 120/79  ?Pulse: 100 84  ?Resp: 17 19  ?Temp: 36.6 ?C (!) 36.2 ?C  ?SpO2: 99% 97%  ?  ?Last Pain:  ?Vitals:  ? 02/08/22 1400  ?TempSrc:   ?PainSc: 5   ? ? ?  ?  ?  ?  ?  ?  ? ?Barnet Glasgow ? ? ? ? ?

## 2022-02-08 NOTE — Transfer of Care (Signed)
Immediate Anesthesia Transfer of Care Note ? ?Patient: Kristena Wilhelmi ? ?Procedure(s) Performed: TOTAL HIP ARTHROPLASTY ANTERIOR APPROACH (Right: Hip) ? ?Patient Location: PACU ? ?Anesthesia Type:Spinal ? ?Level of Consciousness: drowsy and patient cooperative ? ?Airway & Oxygen Therapy: Patient Spontanous Breathing and Patient connected to face mask oxygen ? ?Post-op Assessment: Report given to RN and Post -op Vital signs reviewed and stable ? ?Post vital signs: Reviewed and stable ? ?Last Vitals:  ?Vitals Value Taken Time  ?BP 113/82 02/08/22 1347  ?Temp    ?Pulse 85 02/08/22 1349  ?Resp 17 02/08/22 1349  ?SpO2 100 % 02/08/22 1349  ?Vitals shown include unvalidated device data. ? ?Last Pain:  ?Vitals:  ? 02/08/22 0900  ?TempSrc:   ?PainSc: 6   ?   ? ?  ? ?Complications: No notable events documented. ?

## 2022-02-08 NOTE — Interval H&P Note (Signed)
History and Physical Interval Note: ? ?02/08/2022 ?10:21 AM ? ?Paige Andrews  has presented today for surgery, with the diagnosis of right hip osteoarthritis.  The various methods of treatment have been discussed with the patient and family. After consideration of risks, benefits and other options for treatment, the patient has consented to  Procedure(s): ?TOTAL HIP ARTHROPLASTY ANTERIOR APPROACH (Right) as a surgical intervention.  The patient's history has been reviewed, patient examined, no change in status, stable for surgery.  I have reviewed the patient's chart and labs.  Questions were answered to the patient's satisfaction.   ? ? ?Hilton Cork Sohana Austell ? ? ?

## 2022-02-08 NOTE — Anesthesia Procedure Notes (Signed)
Spinal ? ?Patient location during procedure: OR ?Start time: 02/08/2022 11:30 AM ?End time: 02/08/2022 11:33 AM ?Staffing ?Performed: resident/CRNA  ?Anesthesiologist: Barnet Glasgow, MD ?Resident/CRNA: Raenette Rover, CRNA ?Preanesthetic Checklist ?Completed: patient identified, IV checked, site marked, risks and benefits discussed, surgical consent, monitors and equipment checked, pre-op evaluation and timeout performed ?Spinal Block ?Patient position: sitting ?Prep: DuraPrep ?Patient monitoring: blood pressure, continuous pulse ox and heart rate ?Approach: midline ?Location: L3-4 ?Injection technique: single-shot ?Needle ?Needle type: Pencan  ?Needle gauge: 24 G ?Assessment ?Sensory level: T6 ?Events: CSF return ? ? ? ?

## 2022-02-08 NOTE — Evaluation (Signed)
Physical Therapy Evaluation ?Patient Details ?Name: Paige Andrews ?MRN: [id number] ?DOB: 20-Mar-1956 ?Today's Date: 02/08/2022 ? ?History of Present Illness ? 66 yo s/p RDATHA with PMH of depression, biplor, anemia, GERD, R shoulder surgery, ADD  ?Clinical Impression ? Pt is s/p DA THA resulting in the deficits listed below (see PT Problem List).  Pt will benefit from acute PT to increase their independence and safety with mobility to allow discharge home with daughter assisting. Pt tolerated ambulation this evening and feel pt will progress nicely tomorrow morning with continued education with HEP, ambulation and stair training with anticipation of morning DC if all other medical and pain control goes well also.  ? ? ?   ?   ? ?Recommendations for follow up therapy are one component of a multi-disciplinary discharge planning process, led by the attending physician.  Recommendations may be updated based on patient status, additional functional criteria and insurance authorization. ? ?Follow Up Recommendations Follow physician's recommendations for discharge plan and follow up therapies ? ?  ?Assistance Recommended at Discharge Intermittent Supervision/Assistance  ?Patient can return home with the following ? A little help with bathing/dressing/bathroom;A little help with walking and/or transfers;Help with stairs or ramp for entrance ? ?  ?Equipment Recommendations None recommended by PT  ?Recommendations for Other Services ?    ?  ?Functional Status Assessment Patient has had a recent decline in their functional status and demonstrates the ability to make significant improvements in function in a reasonable and predictable amount of time.  ? ?  ?Precautions / Restrictions Precautions ?Precautions: None ?Restrictions ?Weight Bearing Restrictions: No  ? ?  ? ?Mobility ? Bed Mobility ?Overal bed mobility: Needs Assistance ?Bed Mobility: Supine to Sit, Sit to Supine ?  ?  ?Supine to sit: Min assist ?  ?  ?General bed mobility  comments: Min A with assisting R LE and cues for sequencing to minimize pain as much as possible ?  ? ?Transfers ?Overall transfer level: Needs assistance ?Equipment used: Rolling walker (2 wheels) ?Transfers: Sit to/from Stand ?Sit to Stand: Min guard ?  ?  ?  ?  ?  ?General transfer comment: cues for RW safety adn proper use. ?  ? ?Ambulation/Gait ?Ambulation/Gait assistance: Min guard ?Gait Distance (Feet): 25 Feet ?Assistive device: Rolling walker (2 wheels) ?Gait Pattern/deviations: Step-to pattern ?  ?  ?  ?General Gait Details: stayed in room and performed 2 laps in room. Step to pattern however pt did progress to step through pattern with small steps. Tolerated well. ? ?Stairs ?  ?  ?  ?  ?  ? ?Wheelchair Mobility ?  ? ?Modified Rankin (Stroke Patients Only) ?  ? ?  ? ?Balance Overall balance assessment: Needs assistance ?Sitting-balance support: Bilateral upper extremity supported, Feet supported ?Sitting balance-Leahy Scale: Good ?  ?  ?Standing balance support: Bilateral upper extremity supported, During functional activity ?Standing balance-Leahy Scale: Fair ?  ?  ?  ?  ?  ?  ?  ?  ?  ?  ?  ?  ?   ? ? ? ?Pertinent Vitals/Pain Pain Assessment ?Pain Assessment: 0-10 ?Pain Score: 2  ?Pain Location: R anterior thigh area with movement ?Pain Descriptors / Indicators: Aching, Sore (noticed "swelling" at distal portion of incision area and thigh. Was not painful, was soft and squishy ( sounded like fluid. )) ?Pain Intervention(s): Limited activity within patient's tolerance, Monitored during session, Ice applied  ? ? ?Home Living Family/patient expects to be discharged to:: Private residence ?Living Arrangements:  Alone ?Available Help at Discharge: Family (dtr in from [place] to help her for 2 weeks) ?Type of Home: House ?Home Access: Stairs to enter ?Entrance Stairs-Rails: None ?Entrance Stairs-Number of Steps: 2+2 ?Alternate Level Stairs-Number of Steps: 10 ?Home Layout: Two level;Able to live on main  level with bedroom/bathroom ?Home Equipment: Conservation officer, nature (2 wheels);Crutches ?   ?  ?Prior Function Prior Level of Function : Independent/Modified Independent;Driving ?  ?  ?  ?  ?  ?  ?  ?  ?  ? ? ?Hand Dominance  ?   ? ?  ?Extremity/Trunk Assessment  ?   ?  ? ?Lower Extremity Assessment ?Lower Extremity Assessment: RLE deficits/detail ?RLE Deficits / Details: limited with hip flexion independently due to pain and some numbness still. However had normal ROM with AROM supine and fuctional ROM with transfers and walking ?  ? ?   ?Communication  ? Communication: No difficulties  ?Cognition Arousal/Alertness: Awake/alert ?Behavior During Therapy: [facility] for tasks assessed/performed ?Overall Cognitive Status: Within Functional Limits for tasks assessed ?  ?  ?  ?  ?  ?  ?  ?  ?  ?  ?  ?  ?  ?  ?  ?  ?  ?  ?  ? ?  ?General Comments   ? ?  ?Exercises Total Joint Exercises ?Ankle Circles/Pumps: Both, 10 reps, AROM, Supine ?Quad Sets: AROM, Right, Supine ?Heel Slides: AAROM, Supine, Right, 10 reps ?Hip ABduction/ADduction: AAROM, Supine, Right, 10 reps  ? ?Assessment/Plan  ?  ?PT Assessment Patient needs continued PT services  ?PT Problem List Decreased strength;Decreased mobility;Decreased activity tolerance ? ?   ?  ?PT Treatment Interventions Gait training;Stair training;DME instruction;Therapeutic activities;Therapeutic exercise;Functional mobility training;Patient/family education   ? ?PT Goals (Current goals can be found in the Care Plan section)  ?Acute Rehab PT Goals ?Patient Stated Goal: I want to be more active again ?PT Goal Formulation: With patient ?Time For Goal Achievement: 02/22/22 ?Potential to Achieve Goals: Good ? ?  ?Frequency 7X/week ?  ? ? ?Co-evaluation   ?  ?  ?  ?  ? ? ?  ?AM-PAC PT "6 Clicks" Mobility  ?Outcome Measure Help needed turning from your back to your side while in a flat bed without using bedrails?: A Little ?Help needed moving from lying on your back to sitting on the side of a flat bed  without using bedrails?: A Little ?Help needed moving to and from a bed to a chair (including a wheelchair)?: A Little ?Help needed standing up from a chair using your arms (e.g., wheelchair or bedside chair)?: A Little ?Help needed to walk in hospital room?: A Little ?Help needed climbing 3-5 steps with a railing? : A Little ?6 Click Score: 18 ? ?  ?End of Session Equipment Utilized During Treatment: Gait belt ?Activity Tolerance: Patient tolerated treatment well ?Patient left: in chair;with call bell/phone within reach;with chair alarm set ?Nurse Communication: Mobility status ?PT Visit Diagnosis: Other abnormalities of gait and mobility (R26.89) ?  ? ?Time: 8032-1224 ?PT Time Calculation (min) (ACUTE ONLY): 35 min ? ? ?Charges:   PT Evaluation ?$PT Eval Low Complexity: 1 Low ?PT Treatments ?$Gait Training: 8-22 mins ?  ?   ? ? ?Gatha Mayer, PT, MPT ?Acute Rehabilitation Services ?Office: [phone] ?Pager: [phone] ?02/08/2022 ? ? Clide Dales ?02/08/2022, 7:40 PM ? ?

## 2022-02-09 ENCOUNTER — Encounter (HOSPITAL_COMMUNITY): Payer: Self-pay | Admitting: Orthopedic Surgery

## 2022-02-09 DIAGNOSIS — Z7951 Long term (current) use of inhaled steroids: Secondary | ICD-10-CM | POA: Diagnosis not present

## 2022-02-09 DIAGNOSIS — M1611 Unilateral primary osteoarthritis, right hip: Secondary | ICD-10-CM | POA: Diagnosis present

## 2022-02-09 DIAGNOSIS — Z7984 Long term (current) use of oral hypoglycemic drugs: Secondary | ICD-10-CM | POA: Diagnosis not present

## 2022-02-09 DIAGNOSIS — Z79899 Other long term (current) drug therapy: Secondary | ICD-10-CM | POA: Diagnosis not present

## 2022-02-09 DIAGNOSIS — F4310 Post-traumatic stress disorder, unspecified: Secondary | ICD-10-CM | POA: Diagnosis present

## 2022-02-09 DIAGNOSIS — E0590 Thyrotoxicosis, unspecified without thyrotoxic crisis or storm: Secondary | ICD-10-CM | POA: Diagnosis present

## 2022-02-09 DIAGNOSIS — Z20822 Contact with and (suspected) exposure to covid-19: Secondary | ICD-10-CM | POA: Diagnosis present

## 2022-02-09 DIAGNOSIS — E119 Type 2 diabetes mellitus without complications: Secondary | ICD-10-CM | POA: Diagnosis present

## 2022-02-09 DIAGNOSIS — Z8249 Family history of ischemic heart disease and other diseases of the circulatory system: Secondary | ICD-10-CM | POA: Diagnosis not present

## 2022-02-09 DIAGNOSIS — D62 Acute posthemorrhagic anemia: Secondary | ICD-10-CM | POA: Diagnosis not present

## 2022-02-09 DIAGNOSIS — E7800 Pure hypercholesterolemia, unspecified: Secondary | ICD-10-CM | POA: Diagnosis present

## 2022-02-09 DIAGNOSIS — K219 Gastro-esophageal reflux disease without esophagitis: Secondary | ICD-10-CM | POA: Diagnosis present

## 2022-02-09 DIAGNOSIS — K900 Celiac disease: Secondary | ICD-10-CM | POA: Diagnosis present

## 2022-02-09 DIAGNOSIS — Z885 Allergy status to narcotic agent status: Secondary | ICD-10-CM | POA: Diagnosis not present

## 2022-02-09 DIAGNOSIS — I10 Essential (primary) hypertension: Secondary | ICD-10-CM | POA: Diagnosis present

## 2022-02-09 DIAGNOSIS — F319 Bipolar disorder, unspecified: Secondary | ICD-10-CM | POA: Diagnosis present

## 2022-02-09 DIAGNOSIS — F419 Anxiety disorder, unspecified: Secondary | ICD-10-CM | POA: Diagnosis present

## 2022-02-09 DIAGNOSIS — G4733 Obstructive sleep apnea (adult) (pediatric): Secondary | ICD-10-CM | POA: Diagnosis present

## 2022-02-09 DIAGNOSIS — F988 Other specified behavioral and emotional disorders with onset usually occurring in childhood and adolescence: Secondary | ICD-10-CM | POA: Diagnosis present

## 2022-02-09 DIAGNOSIS — Z7983 Long term (current) use of bisphosphonates: Secondary | ICD-10-CM | POA: Diagnosis not present

## 2022-02-09 LAB — BASIC METABOLIC PANEL
Anion gap: 7 (ref 5–15)
BUN: 12 mg/dL (ref 8–23)
CO2: 21 mmol/L — ABNORMAL LOW (ref 22–32)
Calcium: 7.6 mg/dL — ABNORMAL LOW (ref 8.9–10.3)
Chloride: 105 mmol/L (ref 98–111)
Creatinine, Ser: 0.95 mg/dL (ref 0.44–1.00)
GFR, Estimated: >60 mL/min (ref >60)
Glucose, Bld: 165 mg/dL — ABNORMAL HIGH (ref 70–99)
Potassium: 3.7 mmol/L (ref 3.5–5.1)
Sodium: 133 mmol/L — ABNORMAL LOW (ref 135–145)

## 2022-02-09 LAB — GLUCOSE, CAPILLARY
Glucose-Capillary: 167 mg/dL — ABNORMAL HIGH (ref 70–99)
Glucose-Capillary: 261 mg/dL — ABNORMAL HIGH (ref 70–99)
Glucose-Capillary: 164 mg/dL — ABNORMAL HIGH (ref 70–99)
Glucose-Capillary: 166 mg/dL — ABNORMAL HIGH (ref 70–99)

## 2022-02-09 LAB — CBC
HCT: 22.1 % — ABNORMAL LOW (ref 36.0–46.0)
Hemoglobin: 7.4 g/dL — ABNORMAL LOW (ref 12.0–15.0)
MCH: 30.8 pg (ref 26.0–34.0)
MCHC: 33.5 g/dL (ref 30.0–36.0)
MCV: 92.1 fL (ref 80.0–100.0)
Platelets: 143 10*3/uL — ABNORMAL LOW (ref 150–400)
RBC: 2.4 MIL/uL — ABNORMAL LOW (ref 3.87–5.11)
RDW: 13.2 % (ref 11.5–15.5)
WBC: 7.3 10*3/uL (ref 4.0–10.5)
nRBC: 0 % (ref 0.0–0.2)

## 2022-02-09 MED ORDER — DOCUSATE SODIUM 100 MG PO CAPS: 100.0000 mg | ORAL_CAPSULE | Freq: Two times a day (BID) | ORAL | 0 refills | Status: AC

## 2022-02-09 MED ORDER — ASPIRIN 81 MG PO CHEW: 81.0000 mg | CHEWABLE_TABLET | Freq: Two times a day (BID) | ORAL | 0 refills | Status: AC

## 2022-02-09 MED ORDER — SENNA 8.6 MG PO TABS: 2.0000 | ORAL_TABLET | Freq: Every day | ORAL | 0 refills | Status: AC

## 2022-02-09 MED ORDER — OXYCODONE HCL 10 MG PO TABS: 10.0000 mg | ORAL_TABLET | ORAL | 0 refills | Status: DC | PRN

## 2022-02-09 MED ORDER — POLYETHYLENE GLYCOL 3350 17 G PO PACK: 17.0000 g | PACK | Freq: Every day | ORAL | 0 refills | Status: AC | PRN

## 2022-02-09 MED ORDER — ONDANSETRON HCL 4 MG PO TABS: 4.0000 mg | ORAL_TABLET | Freq: Four times a day (QID) | ORAL | 0 refills | Status: AC | PRN

## 2022-02-09 MED ORDER — METHOCARBAMOL 500 MG PO TABS: 500.0000 mg | ORAL_TABLET | Freq: Four times a day (QID) | ORAL | 0 refills | Status: AC | PRN

## 2022-02-09 NOTE — Plan of Care (Addendum)
Problem: Health Behavior/Discharge Planning: ?Goal: Ability to manage health-related needs will improve ?Outcome: Progressing ?  ?Problem: Clinical Measurements: ?Goal: Ability to maintain clinical measurements within normal limits will improve ?Outcome: Progressing ?  ?Problem: Activity: ?Goal: Risk for activity intolerance will decrease ?Outcome: Progressing ?  ?Problem: Pain Managment: ?Goal: General experience of comfort will improve ?Outcome: Progressing ?  ?Ivan Anchors, RN ?02/09/22 ?10:26 AM ? ?

## 2022-02-09 NOTE — Progress Notes (Signed)
Physical Therapy Treatment ?Patient Details ?Name: Paige Andrews ?MRN: [id number] ?DOB: [birth year] ?Today's Date: 02/09/2022 ? ? ?History of Present Illness 66 yo s/p RDATHA with PMH of depression, biplor, anemia, GERD, R shoulder surgery, ADD ? ?  ?PT Comments  ? ? The patient is very restless and anxious, reports pain is8/10, had been previously medicated with IV medication. Patient  ambulated 50', Patient reports Bed and bath on second level. ? Patient will need further pratice with mobility and steps before meeting goals. RN aware. ? Patient noted drifting off to sleep after returning to bed.   ?Recommendations for follow up therapy are one component of a multi-disciplinary discharge planning process, led by the attending physician.  Recommendations may be updated based on patient status, additional functional criteria and insurance authorization. ? ?Follow Up Recommendations ? Follow physician's recommendations for discharge plan and follow up therapies ?  ?  ?Assistance Recommended at Discharge Intermittent Supervision/Assistance  ?Patient can return home with the following A little help with bathing/dressing/bathroom;A little help with walking and/or transfers;Help with stairs or ramp for entrance;Assistance with cooking/housework;Assist for transportation ?  ?Equipment Recommendations ? None recommended by PT  ?  ?Recommendations for Other Services   ? ? ?  ?Precautions / Restrictions Precautions ?Precautions: Fall  ?  ? ?Mobility ? Bed Mobility ?Overal bed mobility: Needs Assistance ?Bed Mobility: Supine to Sit, Sit to Supine ?  ?  ?Supine to sit: Min assist, HOB elevated ?Sit to supine: Min assist ?  ?General bed mobility comments: use of belt to self assist , use of bed rail and HOB raised. extra time and effort to complete  tasks ?  ? ?Transfers ?Overall transfer level: Needs assistance ?Equipment used: Rolling walker (2 wheels) ?Transfers: Sit to/from Stand ?Sit to Stand: Min assist ?  ?  ?  ?  ?  ?General  transfer comment: cues for RW safety  and hand placem,ent. ?  ? ?Ambulation/Gait ?Ambulation/Gait assistance: Min guard ?Gait Distance (Feet): 50 Feet ?Assistive device: Rolling walker (2 wheels) ?Gait Pattern/deviations: Step-to pattern, Step-through pattern, Antalgic ?Gait velocity: decr ?  ?  ?General Gait Details: Initially decreased WB on  the right leg, gradual increase in WB, ? ? ?Stairs ?  ?  ?  ?  ?  ? ? ?Wheelchair Mobility ?  ? ?Modified Rankin (Stroke Patients Only) ?  ? ? ?  ?Balance Overall balance assessment: Needs assistance ?Sitting-balance support: Bilateral upper extremity supported, Feet supported ?Sitting balance-Leahy Scale: Good ?  ?  ?Standing balance support: Bilateral upper extremity supported, During functional activity, Reliant on assistive device for balance ?Standing balance-Leahy Scale: Fair ?  ?  ?  ?  ?  ?  ?  ?  ?  ?  ?  ?  ?  ? ?  ?Cognition Arousal/Alertness: Suspect due to medications, Lethargic ?Behavior During Therapy: Anxious, Restless ?Overall Cognitive Status: Within Functional Limits for tasks assessed ?  ?  ?  ?  ?  ?  ?  ?  ?  ?  ?  ?  ?  ?  ?  ?  ?General Comments: patient constantly writhing in bed, adjusting self, rolling to side ?  ?  ? ?  ?Exercises Total Joint Exercises ?Heel Slides: AAROM, Supine, Right, 5 reps ? ?  ?General Comments   ?  ?  ? ?Pertinent Vitals/Pain Pain Assessment ?Pain Score: 8  ?Pain Location: R hip and thigh ?Pain Descriptors / Indicators: Aching, Sore ?Pain Intervention(s): Monitored during session, Premedicated before  session, Limited activity within patient's tolerance, Ice applied  ? ? ?Home Living   ?  ?  ?  ?  ?  ?  ?  ?Home Layout: Bed/bath upstairs ?  ?   ?  ?Prior Function    ?  ?  ?   ? ?PT Goals (current goals can now be found in the care plan section) Progress towards PT goals: Progressing toward goals ? ?  ?Frequency ? ? ? 7X/week ? ? ? ?  ?PT Plan Current plan remains appropriate  ? ? ?Co-evaluation   ?  ?  ?  ?  ? ?  ?AM-PAC PT  "6 Clicks" Mobility   ?Outcome Measure ? Help needed turning from your back to your side while in a flat bed without using bedrails?: A Little ?Help needed moving from lying on your back to sitting on the side of a flat bed without using bedrails?: A Little ?Help needed moving to and from a bed to a chair (including a wheelchair)?: A Little ?Help needed standing up from a chair using your arms (e.g., wheelchair or bedside chair)?: A Little ?Help needed to walk in hospital room?: A Lot ?Help needed climbing 3-5 steps with a railing? : Total ?6 Click Score: 15 ? ?  ?End of Session Equipment Utilized During Treatment: Gait belt ?Activity Tolerance: Patient limited by pain ?Patient left: in bed;with call bell/phone within reach;with bed alarm set ?Nurse Communication: Mobility status ?PT Visit Diagnosis: Other abnormalities of gait and mobility (R26.89) ?  ? ? ?Time: 2202-5427 ?PT Time Calculation (min) (ACUTE ONLY): 29 min ? ?Charges:  $Gait Training: 8-22 mins ?$Self Care/Home Management: 8-22          ?          ? ?Tresa Endo PT ?Acute Rehabilitation Services ?Pager [phone] ?Office [phone]' ? ? ?[name] ?02/09/2022, 12:54 PM ? ?

## 2022-02-09 NOTE — TOC Transition Note (Signed)
Transition of Care (TOC) - CM/SW Discharge Note ? ?Patient Details  ?Name: Paige Andrews ?MRN: [id number] ?Date of Birth: July 05, 1956 ? ?Transition of Care (TOC) CM/SW Contact:  ?Sherie Don, LCSW ?Phone Number: ?02/09/2022, 1:57 PM ? ?Clinical Narrative: Patient is expected to discharge home after working with PT. CSW met with patient to confirm discharge plan. Patient will go home with a home exercise program (HEP). Patient has a rolling walker at home, so there are no DME needs at this time. TOC signing off. ? ?Final next level of care: Home/Self Care ?Barriers to Discharge: No Barriers Identified ? ?Patient Goals and CMS Choice ?Patient states their goals for this hospitalization and ongoing recovery are:: Discharge home with HEP ?Choice offered to / list presented to : NA ? ?Discharge Plan and Services        ?DME Arranged: N/A ?DME Agency: NA ? ?Readmission Risk Interventions ?No flowsheet data found. ? ?

## 2022-02-09 NOTE — Plan of Care (Signed)
  Problem: Nutrition: Goal: Adequate nutrition will be maintained Outcome: Progressing   

## 2022-02-09 NOTE — Discharge Summary (Signed)
Physician Discharge Summary  Patient ID: Paige Andrews MRN: [id number] DOB/AGE: 04/05/1956 66 y.o.  Admit date: 02/08/2022 Discharge date: 02/11/2022  Admission Diagnoses:  Osteoarthritis of right hip  Discharge Diagnoses:  Principal Problem:   Osteoarthritis of right hip Active Problems:   Primary osteoarthritis of right hip   Past Medical History:  Diagnosis Date   ADD (attention deficit disorder)    Anemia    Anxiety    Arthritis    Asthma    Binge eating disorder    Bipolar 1 disorder (HCC)    Celiac disease    Chronic lumbar pain    Depression    Diabetes (HCC)    GERD (gastroesophageal reflux disease)    Hypercholesterolemia    Hypertension    Hyperthyroidism    IBS (irritable bowel syndrome)    Migraine    OSA (obstructive sleep apnea)    NO CPAP CURRENTLY   Pneumonia    PTSD (post-traumatic stress disorder)     Surgeries: Procedure(s): TOTAL HIP ARTHROPLASTY ANTERIOR APPROACH on 02/08/2022   Consultants (if any):   Discharged Condition: Improved  Hospital Course: Paige Andrews is an 66 y.o. female who was admitted 02/08/2022 with a diagnosis of Osteoarthritis of right hip and went to the operating room on 02/08/2022 and underwent the above named procedures.    She was given perioperative antibiotics:  Anti-infectives (From admission, onward)    Start     Dose/Rate Route Frequency Ordered Stop   02/08/22 1800  ceFAZolin (ANCEF) IVPB 2g/100 mL premix        2 g 200 mL/hr over 30 Minutes Intravenous Every 6 hours 02/08/22 1508 02/09/22 0002   02/08/22 0845  ceFAZolin (ANCEF) IVPB 2g/100 mL premix        2 g 200 mL/hr over 30 Minutes Intravenous On call to O.R. 02/08/22 0831 02/08/22 1138       She was given sequential compression devices, early ambulation, and ASA for DVT prophylaxis.  On POD#2, hgb was 6.9 so 1 unit PRBCs was given with response to 7.8.  She benefited maximally from the hospital stay and there were no complications.    Recent vital  signs:  Vitals:   02/11/22 0826 02/11/22 0855  BP: 128/60   Pulse: (!) 110   Resp: 16   Temp: 98.7 F (37.1 C)   SpO2: 98% 96%    Recent laboratory studies:  Lab Results  Component Value Date   HGB 7.8 (L) 02/11/2022   HGB 8.0 (L) 02/10/2022   HGB 6.9 (LL) 02/10/2022   Lab Results  Component Value Date   WBC 6.1 02/11/2022   PLT 136 (L) 02/11/2022   No results found for: INR Lab Results  Component Value Date   NA 133 (L) 02/09/2022   K 3.7 02/09/2022   CL 105 02/09/2022   CO2 21 (L) 02/09/2022   BUN 12 02/09/2022   CREATININE 0.95 02/09/2022   GLUCOSE 165 (H) 02/09/2022     Allergies as of 02/11/2022       Reactions   Morphine And Related Anxiety   Patient states, " it makes me  feel like I am crawling out of my skin"        Medication List     STOP taking these medications    oxyCODONE-acetaminophen 5-325 MG tablet Commonly known as: PERCOCET/ROXICET       TAKE these medications    Accu-Chek Aviva Plus w/Device Kit Use as instructed to test sugar once daily  Accu-Chek [employer] Kit Use as directed once daily   Accu-Chek Softclix Lancets lancets Use as instructed to test sugar once daily   acetaminophen 500 MG tablet Commonly known as: TYLENOL Take 1,000 mg by mouth every 8 (eight) hours as needed for moderate pain.   alendronate 70 MG tablet Commonly known as: FOSAMAX Take 70 mg by mouth every Friday.   aspirin 81 MG chewable tablet Chew 1 tablet (81 mg total) by mouth 2 (two) times daily.   atorvastatin 80 MG tablet Commonly known as: LIPITOR Take 1 tablet by mouth once daily   baclofen 10 MG tablet Commonly known as: LIORESAL TAKE 1 TABLET BY MOUTH THREE TIMES DAILY What changed:  how much to take how to take this when to take this   diphenhydrAMINE 25 MG tablet Commonly known as: BENADRYL Take 25 mg by mouth every 6 (six) hours as needed for allergies.   docusate sodium 100 MG capsule Commonly known as:  COLACE Take 1 capsule (100 mg total) by mouth 2 (two) times daily.   fluticasone 110 MCG/ACT inhaler Commonly known as: Flovent HFA Inhale 2 puffs into the lungs 2 (two) times daily.   gabapentin 600 MG tablet Commonly known as: NEURONTIN Take 600 mg by mouth 3 (three) times daily.   glipiZIDE 5 MG tablet Commonly known as: GLUCOTROL Take 0.5 tablets (2.5 mg total) by mouth 2 (two) times daily before a meal.   glucose blood test strip Commonly known as: Accu-Chek Aviva Plus Use as instructed to test sugar once daily   hydrOXYzine 50 MG tablet Commonly known as: ATARAX Take 50 mg by mouth 3 (three) times daily as needed for anxiety.   losartan 50 MG tablet Commonly known as: COZAAR Take 50 mg by mouth daily.   magnesium oxide 400 (241.3 Mg) MG tablet Commonly known as: MAG-OX Take 400 mg by mouth daily.   meclizine 25 MG tablet Commonly known as: ANTIVERT Take 25 mg by mouth 3 (three) times daily as needed for dizziness.   metFORMIN 500 MG tablet Commonly known as: GLUCOPHAGE Take 2 tablets (1,000 mg total) by mouth 2 (two) times daily with a meal.   methocarbamol 500 MG tablet Commonly known as: ROBAXIN Take 1 tablet (500 mg total) by mouth every 6 (six) hours as needed for muscle spasms.   ondansetron 4 MG tablet Commonly known as: ZOFRAN Take 1 tablet (4 mg total) by mouth every 6 (six) hours. What changed:  when to take this reasons to take this   ondansetron 4 MG tablet Commonly known as: ZOFRAN Take 1 tablet (4 mg total) by mouth every 6 (six) hours as needed for nausea. What changed: You were already taking a medication with the same name, and this prescription was added. Make sure you understand how and when to take each.   oxyCODONE 15 MG immediate release tablet Commonly known as: ROXICODONE Take 1 tablet (15 mg total) by mouth every 4 (four) hours as needed for severe pain.   pantoprazole 40 MG tablet Commonly known as: PROTONIX Take 1 tablet (40  mg total) by mouth daily.   polyethylene glycol 17 g packet Commonly known as: MIRALAX / GLYCOLAX Take 17 g by mouth daily as needed for mild constipation.   ProAir HFA 108 (90 Base) MCG/ACT inhaler Generic drug: albuterol Inhale 2 puffs into the lungs every 6 (six) hours as needed for wheezing or shortness of breath.   rizatriptan 10 MG tablet Commonly known as: MAXALT Take 10 mg  by mouth as needed for migraine.   senna 8.6 MG Tabs tablet Commonly known as: SENOKOT Take 2 tablets (17.2 mg total) by mouth at bedtime.   Spacer/Aero-Holding [employer] Take 1 each by mouth as directed. Rinse mouth after use.   Vyvanse 70 MG capsule Generic drug: lisdexamfetamine Take 70 mg by mouth every morning.   zolpidem 5 MG tablet Commonly known as: AMBIEN TAKE 1 TABLET BY MOUTH AT BEDTIME AS NEEDED FOR SLEEP               Durable Medical Equipment  (From admission, onward)           Start     Ordered   02/11/22 1310  For home use only DME Walker rolling  Once       Question Answer Comment  Walker: With 5 Inch Wheels   Patient needs a walker to treat with the following condition Status post hip surgery      02/11/22 1310               WEIGHT BEARING   Weight bearing as tolerated with assist device (walker, cane, etc) as directed, use it as long as suggested by your surgeon or therapist, typically at least 4-6 weeks.   EXERCISES  Results after joint replacement surgery are often greatly improved when you follow the exercise, range of motion and muscle strengthening exercises prescribed by your doctor. Safety measures are also important to protect the joint from further injury. Any time any of these exercises cause you to have increased pain or swelling, decrease what you are doing until you are comfortable again and then slowly increase them. If you have problems or questions, call your caregiver or physical therapist for advice.   Rehabilitation is important  following a joint replacement. After just a few days of immobilization, the muscles of the leg can become weakened and shrink (atrophy).  These exercises are designed to build up the tone and strength of the thigh and leg muscles and to improve motion. Often times heat used for twenty to thirty minutes before working out will loosen up your tissues and help with improving the range of motion but do not use heat for the first two weeks following surgery (sometimes heat can increase post-operative swelling).   These exercises can be done on a training (exercise) mat, on the floor, on a table or on a bed. Use whatever works the best and is most comfortable for you.    Use music or television while you are exercising so that the exercises are a pleasant break in your day. This will make your life better with the exercises acting as a break in your routine that you can look forward to.   Perform all exercises about fifteen times, three times per day or as directed.  You should exercise both the operative leg and the other leg as well.  Exercises include:   Quad Sets - Tighten up the muscle on the front of the thigh (Quad) and hold for 5-10 seconds.   Straight Leg Raises - With your knee straight (if you were given a brace, keep it on), lift the leg to 60 degrees, hold for 3 seconds, and slowly lower the leg.  Perform this exercise against resistance later as your leg gets stronger.  Leg Slides: Lying on your back, slowly slide your foot toward your buttocks, bending your knee up off the floor (only go as far as is comfortable). Then slowly slide your  foot back down until your leg is flat on the floor again.  Angel Wings: Lying on your back spread your legs to the side as far apart as you can without causing discomfort.  Hamstring Strength:  Lying on your back, push your heel against the floor with your leg straight by tightening up the muscles of your buttocks.  Repeat, but this time bend your knee to a  comfortable angle, and push your heel against the floor.  You may put a pillow under the heel to make it more comfortable if necessary.   A rehabilitation program following joint replacement surgery can speed recovery and prevent re-injury in the future due to weakened muscles. Contact your doctor or a physical therapist for more information on knee rehabilitation.    CONSTIPATION  Constipation is defined medically as fewer than three stools per week and severe constipation as less than one stool per week.  Even if you have a regular bowel pattern at home, your normal regimen is likely to be disrupted due to multiple reasons following surgery.  Combination of anesthesia, postoperative narcotics, change in appetite and fluid intake all can affect your bowels.   YOU MUST use at least one of the following options; they are listed in order of increasing strength to get the job done.  They are all available over the counter, and you may need to use some, POSSIBLY even all of these options:    Drink plenty of fluids (prune juice may be helpful) and high fiber foods Colace 100 mg by mouth twice a day  Senokot for constipation as directed and as needed Dulcolax (bisacodyl), take with full glass of water  Miralax (polyethylene glycol) once or twice a day as needed.  If you have tried all these things and are unable to have a bowel movement in the first 3-4 days after surgery call either your surgeon or your primary doctor.    If you experience loose stools or diarrhea, hold the medications until you stool forms back up.  If your symptoms do not get better within 1 week or if they get worse, check with your doctor.  If you experience "the worst abdominal pain ever" or develop nausea or vomiting, please contact the office immediately for further recommendations for treatment.   ITCHING:  If you experience itching with your medications, try taking only a single pain pill, or even half a pain pill at a time.   You can also use Benadryl over the counter for itching or also to help with sleep.   TED HOSE STOCKINGS:  Use stockings on both legs until for at least 2 weeks or as directed by physician office. They may be removed at night for sleeping.  MEDICATIONS:  See your medication summary on the After Visit Summary that nursing will review with you.  You may have some home medications which will be placed on hold until you complete the course of blood thinner medication.  It is important for you to complete the blood thinner medication as prescribed.  PRECAUTIONS:  If you experience chest pain or shortness of breath - call 911 immediately for transfer to the hospital emergency department.   If you develop a fever greater that 101 F, purulent drainage from wound, increased redness or drainage from wound, foul odor from the wound/dressing, or calf pain - CONTACT YOUR SURGEON.  FOLLOW-UP APPOINTMENTS:  If you do not already have a post-op appointment, please call the office for an appointment to be seen by your surgeon.  Guidelines for how soon to be seen are listed in your After Visit Summary, but are typically between 1-4 weeks after surgery.  OTHER INSTRUCTIONS:   Knee Replacement:  Do not place pillow under knee, focus on keeping the knee straight while resting. CPM instructions: 0-90 degrees, 2 hours in the morning, 2 hours in the afternoon, and 2 hours in the evening. Place foam block, curve side up under heel at all times except when in CPM or when walking.  DO NOT modify, tear, cut, or change the foam block in any way.   MAKE SURE YOU:  Understand these instructions.  Get help right away if you are not doing well or get worse.    Thank you for letting us be a part of your medical care team.  It is a privilege we respect greatly.  We hope these instructions will help you stay on track for a fast and full recovery!   Diagnostic Studies: DG  Pelvis Portable  Result Date: 02/08/2022 CLINICAL DATA:  Status post right hip arthroplasty EXAM: PORTABLE PELVIS 1-2 VIEWS COMPARISON:  None. FINDINGS: There is evidence of right hip arthroplasty. There are pockets of air in the subcutaneous plane. No fracture or dislocation is seen. Degenerative changes are noted in the left hip. IMPRESSION: Status post recent right hip arthroplasty. Electronically Signed   By: Elmer Picker M.D.   On: 02/08/2022 14:39   DG C-Arm 1-60 Min-No Report  Result Date: 02/08/2022 Fluoroscopy was utilized by the requesting physician.  No radiographic interpretation.   DG C-Arm 1-60 Min-No Report  Result Date: 02/08/2022 Fluoroscopy was utilized by the requesting physician.  No radiographic interpretation.   DG HIP UNILAT WITH PELVIS 1V RIGHT  Result Date: 02/08/2022 CLINICAL DATA:  Right hip replacement EXAM: DG HIP (WITH OR WITHOUT PELVIS) 1V RIGHT COMPARISON:  08/24/2021 FINDINGS: Right hip replacement. Satisfactory position alignment. No fracture or complication IMPRESSION: Satisfactory right hip replacement. Electronically Signed   By: Franchot Gallo M.D.   On: 02/08/2022 13:16    Disposition: Discharge disposition: 01-Home or Self Care        Discharge Instructions     Call MD / Call 911   Complete by: As directed    If you experience chest pain or shortness of breath, CALL 911 and be transported to the hospital emergency room.  If you develope a fever above 101 F, pus (white drainage) or increased drainage or redness at the wound, or calf pain, call your surgeon's office.   Constipation Prevention   Complete by: As directed    Drink plenty of fluids.  Prune juice may be helpful.  You may use a stool softener, such as Colace (over the counter) 100 mg twice a day.  Use MiraLax (over the counter) for constipation as needed.   Diet Carb Modified   Complete by: As directed    Discharge instructions   Complete by: As directed    Elevate toes above  nose. Ice hip for 20 minutes every 2 hours. Every hour while awake, get up and ambulate a short distance in the home.   Driving restrictions   Complete by: As directed    No driving for 6 weeks   Increase activity slowly as tolerated   Complete by: As directed    Lifting restrictions   Complete by: As directed  No lifting for 6 weeks   Post-operative opioid taper instructions:   Complete by: As directed    POST-OPERATIVE OPIOID TAPER INSTRUCTIONS: It is important to wean off of your opioid medication as soon as possible. If you do not need pain medication after your surgery it is ok to stop day one. Opioids include: Codeine, Hydrocodone(Norco, Vicodin), Oxycodone(Percocet, oxycontin) and hydromorphone amongst others.  Long term and even short term use of opiods can cause: Increased pain response Dependence Constipation Depression Respiratory depression And more.  Withdrawal symptoms can include Flu like symptoms Nausea, vomiting And more Techniques to manage these symptoms Hydrate well Eat regular healthy meals Stay active Use relaxation techniques(deep breathing, meditating, yoga) Do Not substitute Alcohol to help with tapering If you have been on opioids for less than two weeks and do not have pain than it is ok to stop all together.  Plan to wean off of opioids This plan should start within one week post op of your joint replacement. Maintain the same interval or time between taking each dose and first decrease the dose.  Cut the total daily intake of opioids by one tablet each day Next start to increase the time between doses. The last dose that should be eliminated is the evening dose.      TED hose   Complete by: As directed    Use stockings (TED hose) for 2 weeks on both leg(s).  You may remove them at night for sleeping.        Follow-up Information     Latana Colin, Aaron Edelman, MD. Schedule an appointment as soon as possible for a visit in 2 week(s).   Specialty:  Orthopedic Surgery Why: For wound re-check Contact information: [address] [place] [address] [phone]                  Signed: Hilton Cork Joal Eakle 02/11/2022, 1:45 PM

## 2022-02-09 NOTE — Progress Notes (Signed)
Physical Therapy Treatment ?Patient Details ?Name: Paige Andrews ?MRN: [id number] ?DOB: 1956/08/25 ?Today's Date: 02/09/2022 ? ? ?History of Present Illness 66 yo s/p RDATHA with PMH of depression, biplor, anemia, GERD, R shoulder surgery, ADD ? ?  ?PT Comments  ? ? Patient reporting  right hip pain at 8/10. RN alerted. Patient using rail, HOB raised and belt to move right leg to get up to sitting. Ha patient  lift both legs with belt at 1 time to place legs back onto bed. ? Progress slower than anticipated. Patient unable to practice steps today, goals not met for DC . RN aware.  ? Patient has  bed and bath on  second floor.  Continue  PT for stairs tomorrow.  ?Recommendations for follow up therapy are one component of a multi-disciplinary discharge planning process, led by the attending physician.  Recommendations may be updated based on patient status, additional functional criteria and insurance authorization. ? ?Follow Up Recommendations ? Follow physician's recommendations for discharge plan and follow up therapies ?  ?  ?Assistance Recommended at Discharge Intermittent Supervision/Assistance  ?Patient can return home with the following A little help with bathing/dressing/bathroom;A little help with walking and/or transfers;Help with stairs or ramp for entrance;Assistance with cooking/housework;Assist for transportation ?  ?Equipment Recommendations ? None recommended by PT  ?  ?Recommendations for Other Services   ? ? ?  ?Precautions / Restrictions Precautions ?Precautions: Fall ?Restrictions ?Weight Bearing Restrictions: No  ?  ? ?Mobility ? Bed Mobility ?Overal bed mobility: Needs Assistance ?Bed Mobility: Supine to Sit, Sit to Supine ?  ?  ?Supine to sit: Min guard ?Sit to supine: Min guard ?  ?General bed mobility comments: use of belt to self assist , use of bed rail and HOB raised. extra time and effort to complete  tasks, patient performed without extra assistance but slowly, stops frequently and reports  pain. ?  ? ?Transfers ?Overall transfer level: Needs assistance ?Equipment used: Rolling walker (2 wheels) ?Transfers: Sit to/from Stand ?Sit to Stand: Min assist ?  ?  ?  ?  ?  ?General transfer comment: cues for RW safety  and hand placement. Position inside Rw to increase UE use and support ?  ? ?Ambulation/Gait ?Ambulation/Gait assistance: Min guard ?Gait Distance (Feet): 20 Feet (x2) ?Assistive device: Rolling walker (2 wheels) ?Gait Pattern/deviations: Step-to pattern, Step-through pattern, Antalgic ?Gait velocity: decr ?  ?  ?General Gait Details: increased WB and  step length on the right leg ? ? ?Stairs ?  ?  ?  ?  ?  ? ? ?Wheelchair Mobility ?  ? ?Modified Rankin (Stroke Patients Only) ?  ? ? ?  ?Balance Overall balance assessment: Needs assistance ?Sitting-balance support: Bilateral upper extremity supported, Feet supported ?Sitting balance-Leahy Scale: Good ?  ?  ?Standing balance support: Bilateral upper extremity supported, During functional activity, Reliant on assistive device for balance ?Standing balance-Leahy Scale: Fair ?  ?  ?  ?  ?  ?  ?  ?  ?  ?  ?  ?  ?  ? ?  ?Cognition Arousal/Alertness: Awake/alert ?Behavior During Therapy: Anxious, Restless ?Overall Cognitive Status: Within Functional Limits for tasks assessed ?  ?  ?  ?  ?  ?  ?  ?  ?  ?  ?  ?  ?  ?  ?  ?  ?General Comments: patient constantly writhing in bed, adjusting self, rolling to side, using belt on right  foot to self  position leg ?  ?  ? ?  ?  Exercises Total Joint Exercises ?Heel Slides: AAROM, Supine, Right, 5 reps ? ?  ?General Comments   ?  ?  ? ?Pertinent Vitals/Pain Pain Assessment ?Pain Score: 8  ?Pain Location: R hip and thigh ?Pain Descriptors / Indicators: Aching, Sore ?Pain Intervention(s): Monitored during session, Limited activity within patient's tolerance, Patient requesting pain meds-RN notified, Ice applied  ? ? ?Home Living   ?  ?  ?  ?  ?  ?  ?  ?Home Layout: Bed/bath upstairs ?  ?   ?  ?Prior Function    ?  ?   ?   ? ?PT Goals (current goals can now be found in the care plan section) Progress towards PT goals: Progressing toward goals ? ?  ?Frequency ? ? ? 7X/week ? ? ? ?  ?PT Plan Current plan remains appropriate  ? ? ?Co-evaluation   ?  ?  ?  ?  ? ?  ?AM-PAC PT "6 Clicks" Mobility   ?Outcome Measure ? Help needed turning from your back to your side while in a flat bed without using bedrails?: A Little ?Help needed moving from lying on your back to sitting on the side of a flat bed without using bedrails?: A Little ?Help needed moving to and from a bed to a chair (including a wheelchair)?: A Little ?Help needed standing up from a chair using your arms (e.g., wheelchair or bedside chair)?: A Little ?Help needed to walk in hospital room?: A Lot ?Help needed climbing 3-5 steps with a railing? : A Lot ?6 Click Score: 16 ? ?  ?End of Session Equipment Utilized During Treatment: Gait belt ?Activity Tolerance: Patient limited by pain ?Patient left: in bed;with call bell/phone within reach;with bed alarm set ?Nurse Communication: Mobility status;Patient requests pain meds ?PT Visit Diagnosis: Other abnormalities of gait and mobility (R26.89) ?  ? ? ?Time: 3335-4562 ?PT Time Calculation (min) (ACUTE ONLY): 29 min ? ?Charges:  $Gait Training: 8-22 mins ?$Self Care/Home Management: 8-22          ?          ? ?Tresa Endo PT ?Acute Rehabilitation Services ?Pager [phone] ?Office [phone] ? ? ? ?[name] ?02/09/2022, 2:43 PM ? ?

## 2022-02-09 NOTE — Progress Notes (Signed)
? ? ?  Subjective: ? ?Patient reports pain as moderate to severe.  Denies N/V/CP/SOB. No c/o. ? ?Objective:  ? ?VITALS:   ?Vitals:  ? 02/08/22 2011 02/08/22 2152 02/09/22 0043 02/09/22 0515  ?BP:  (!) 134/92 (!) 156/88 136/75  ?Pulse:  (!) 107 (!) 106 (!) 104  ?Resp:  16 18 18   ?Temp:  98.7 ?F (37.1 ?C) 99.3 ?F (37.4 ?C) 98.9 ?F (37.2 ?C)  ?TempSrc:  Oral Oral Oral  ?SpO2: 98% 97% 100% 98%  ?Weight:      ?Height:      ? ? ?NAD ?ABD soft ?Sensation intact distally ?Intact pulses distally ?Dorsiflexion/Plantar flexion intact ?Incision: dressing C/D/I ? ? ?Lab Results  ?Component Value Date  ? WBC 7.3 02/09/2022  ? HGB 7.4 (L) 02/09/2022  ? HCT 22.1 (L) 02/09/2022  ? MCV 92.1 02/09/2022  ? PLT 143 (L) 02/09/2022  ? ?BMET ?   ?Component Value Date/Time  ? NA 133 (L) 02/09/2022 0331  ? NA 126 (L) 10/19/2020 1121  ? K 3.7 02/09/2022 0331  ? CL 105 02/09/2022 0331  ? CO2 21 (L) 02/09/2022 0331  ? GLUCOSE 165 (H) 02/09/2022 0331  ? BUN 12 02/09/2022 0331  ? BUN 8 10/19/2020 1121  ? CREATININE 0.95 02/09/2022 0331  ? CREATININE 0.83 02/09/2017 1013  ? CALCIUM 7.6 (L) 02/09/2022 0331  ? GFRNONAA >60 02/09/2022 0331  ? ? ? ?Assessment/Plan: ?1 Day Post-Op  ? ?Principal Problem: ?  Osteoarthritis of right hip ?Active Problems: ?  Primary osteoarthritis of right hip ? ? ?WBAT with walker ?DVT ppx: Aspirin, SCDs, TEDS ?PO pain control ?PT/OT ?ABLA: asymptomatic, monitor ?Dispo: D/c home w HEP after clears PT ? ? ?Paige Andrews ?02/09/2022, 7:14 AM ? ? ?Rod Can, MD ?[phone] ?[facility] ?[address], [place], [place] [address] ?Phone: [phone] ?[email or web address] ?Facebook  Engineer, structural  ?  ? ? ?

## 2022-02-10 LAB — CBC
HCT: 21.4 % — ABNORMAL LOW (ref 36.0–46.0)
HCT: 23.7 % — ABNORMAL LOW (ref 36.0–46.0)
Hemoglobin: 6.9 g/dL — CL (ref 12.0–15.0)
Hemoglobin: 8 g/dL — ABNORMAL LOW (ref 12.0–15.0)
MCH: 30.9 pg (ref 26.0–34.0)
MCH: 30.7 pg (ref 26.0–34.0)
MCHC: 32.2 g/dL (ref 30.0–36.0)
MCHC: 33.8 g/dL (ref 30.0–36.0)
MCV: 96 fL (ref 80.0–100.0)
MCV: 90.8 fL (ref 80.0–100.0)
Platelets: 123 10*3/uL — ABNORMAL LOW (ref 150–400)
Platelets: 127 10*3/uL — ABNORMAL LOW (ref 150–400)
RBC: 2.23 MIL/uL — ABNORMAL LOW (ref 3.87–5.11)
RBC: 2.61 MIL/uL — ABNORMAL LOW (ref 3.87–5.11)
RDW: 13.8 % (ref 11.5–15.5)
RDW: 14.8 % (ref 11.5–15.5)
WBC: 5.8 10*3/uL (ref 4.0–10.5)
WBC: 7.3 10*3/uL (ref 4.0–10.5)
nRBC: 0 % (ref 0.0–0.2)
nRBC: 0 % (ref 0.0–0.2)

## 2022-02-10 LAB — GLUCOSE, CAPILLARY
Glucose-Capillary: 179 mg/dL — ABNORMAL HIGH (ref 70–99)
Glucose-Capillary: 178 mg/dL — ABNORMAL HIGH (ref 70–99)
Glucose-Capillary: 185 mg/dL — ABNORMAL HIGH (ref 70–99)
Glucose-Capillary: 159 mg/dL — ABNORMAL HIGH (ref 70–99)

## 2022-02-10 LAB — PREPARE RBC (CROSSMATCH)

## 2022-02-10 MED ORDER — SODIUM CHLORIDE 0.9% IV SOLUTION: Freq: Once | INTRAVENOUS | Status: AC

## 2022-02-10 NOTE — Progress Notes (Signed)
? ? ?  Subjective: ? ?Patient reports pain as moderate to severe.  Denies N/V/CP/SOB. Pain 8/10 with PT. Did not pass PT yesterday due to slow progress. ? ?Objective:  ? ?VITALS:   ?Vitals:  ? 02/09/22 1436 02/09/22 2042 02/09/22 2211 02/10/22 3668  ?BP: 123/80  140/76 (!) 149/76  ?Pulse:   (!) 110 (!) 110  ?Resp: 18  17 18   ?Temp:   99.8 ?F (37.7 ?C) 100.2 ?F (37.9 ?C)  ?TempSrc:    Oral  ?SpO2: 100%  100% 100%  ?Weight:  75.8 kg    ?Height:  5' 4"  (1.626 m)    ? ? ?NAD ?ABD soft ?Sensation intact distally ?Intact pulses distally ?Dorsiflexion/Plantar flexion intact ?Incision: dressing C/D/I ? ? ?Lab Results  ?Component Value Date  ? WBC 5.8 02/10/2022  ? HGB 6.9 (LL) 02/10/2022  ? HCT 21.4 (L) 02/10/2022  ? MCV 96.0 02/10/2022  ? PLT 123 (L) 02/10/2022  ? ?BMET ?   ?Component Value Date/Time  ? NA 133 (L) 02/09/2022 0331  ? NA 126 (L) 10/19/2020 1121  ? K 3.7 02/09/2022 0331  ? CL 105 02/09/2022 0331  ? CO2 21 (L) 02/09/2022 0331  ? GLUCOSE 165 (H) 02/09/2022 0331  ? BUN 12 02/09/2022 0331  ? BUN 8 10/19/2020 1121  ? CREATININE 0.95 02/09/2022 0331  ? CREATININE 0.83 02/09/2017 1013  ? CALCIUM 7.6 (L) 02/09/2022 0331  ? GFRNONAA >60 02/09/2022 0331  ? ? ? ?Assessment/Plan: ?2 Days Post-Op  ? ?Principal Problem: ?  Osteoarthritis of right hip ?Active Problems: ?  Primary osteoarthritis of right hip ? ? ?WBAT with walker ?DVT ppx: Aspirin, SCDs, TEDS ?PO pain control ?PT/OT ?ABLA: hgb 6.9 today, transfuse 1 unit PRBCs, monitor ?Dispo: PRBCs this am, check CBC this afternoon, D/c home w HEP after clears PT ? ? ?Hilton Cork Benz Vandenberghe ?02/10/2022, 7:37 AM ? ? ?Rod Can, MD ?([phone] ?[facility] ?[address], [address], [place], [place] [address] ?Phone: [phone] ?[email or web address] ?Facebook  Engineer, structural  ?  ? ? ?

## 2022-02-10 NOTE — Progress Notes (Signed)
?   02/10/22 0401  ?Provider Notification  ?Provider Name/Title Laurine Blazer PA  ?Date Provider Notified 02/10/22  ?Time Provider Notified [phone]  ?Notification Type Call  ?Notification Reason Critical result  ?Test performed and critical result Hgb 6.9  ?Date Critical Result Received 02/10/22  ?Time Critical Result Received 0350  ?Provider response See new orders  ?Date of Provider Response 02/10/22  ?Time of Provider Response [phone] ?(answered phone)  ? ? ?

## 2022-02-10 NOTE — Plan of Care (Signed)
  Problem: Pain Managment: Goal: General experience of comfort will improve Outcome: Progressing   Problem: Safety: Goal: Ability to remain free from injury will improve Outcome: Progressing   

## 2022-02-10 NOTE — Progress Notes (Signed)
Physical Therapy Treatment ?Patient Details ?Name: Paige Andrews ?MRN: [id number] ?DOB: Jan 12, 1956 ?Today's Date: 02/10/2022 ? ? ?History of Present Illness 66 yo s/p RDATHA with PMH of depression, biplor, anemia, GERD, R shoulder surgery, ADD ? ?  ?PT Comments  ? ? POD # 2 am session withheld due to HgB 6.9 and scheduled to receive 2 units. ? ?POD # 2 pm session ?First unit completed just after 2:30 pm so assisted OOB to amb to bathroom was challenging.  General bed mobility comments: demonstarted and instructed how to use belt to self assist R LE off bed with 50% VC's as her anxiety increased with pain.  Sitting EOB was worst.  "I got to standup and take the pressure off".  "Once I'm up I'm better".  Pt impulsive,.General transfer comment: 25% VC's on safety with turns and proper hand placement esp with stand to sit onto toilet.  Increased pain with sit to stand and stand to sit.  Increased anxiety. General Gait Details: assisted with amb to and from bathroom only due to increased pain level and anxiety.  Unsteady. Assisted to recliner and applied ICE. ?Pt did not meet her mobility goals to safely D/C to home today. ?  ?Recommendations for follow up therapy are one component of a multi-disciplinary discharge planning process, led by the attending physician.  Recommendations may be updated based on patient status, additional functional criteria and insurance authorization. ? ?Follow Up Recommendations ? Follow physician's recommendations for discharge plan and follow up therapies ?  ?  ?Assistance Recommended at Discharge Intermittent Supervision/Assistance  ?Patient can return home with the following A little help with bathing/dressing/bathroom;A little help with walking and/or transfers;Help with stairs or ramp for entrance;Assistance with cooking/housework;Assist for transportation ?  ?Equipment Recommendations ? None recommended by PT  ?  ?Recommendations for Other Services   ? ? ?  ?Precautions / Restrictions  Precautions ?Precautions: Fall ?Restrictions ?Weight Bearing Restrictions: No  ?  ? ?Mobility ? Bed Mobility ?Overal bed mobility: Needs Assistance ?Bed Mobility: Supine to Sit ?  ?  ?Supine to sit: Min guard, Min assist ?  ?  ?General bed mobility comments: demonstarted and instructed how to use belt to self assist R LE off bed with 50% VC's as her anxiety increased with pain.  Sitting EOB was worst.  "I got to standup and take the pressure off".  "Once I'm up I'm better".  Pt impulsive,. ?  ? ?Transfers ?Overall transfer level: Needs assistance ?Equipment used: Rolling walker (2 wheels) ?Transfers: Sit to/from Stand ?Sit to Stand: Min assist ?  ?  ?  ?  ?  ?General transfer comment: 25% VC's on safety with turns and proper hand placement esp with stand to sit onto toilet.  Increased pain with sit to stand and stand to sit.  Increased anxiety. ?  ? ?Ambulation/Gait ?Ambulation/Gait assistance: Supervision, Min guard ?Gait Distance (Feet): 22 Feet ?Assistive device: Rolling walker (2 wheels) ?Gait Pattern/deviations: Step-to pattern, Step-through pattern, Antalgic ?Gait velocity: decr ?  ?  ?General Gait Details: assisted with amb to and from bathroom only due to increased pain level and anxiety.  Unsteady. ? ? ?Stairs ?  ?  ?  ?  ?  ? ? ?Wheelchair Mobility ?  ? ?Modified Rankin (Stroke Patients Only) ?  ? ? ?  ?Balance   ?  ?  ?  ?  ?  ?  ?  ?  ?  ?  ?  ?  ?  ?  ?  ?  ?  ?  ?  ? ?  ?  Cognition Arousal/Alertness: Awake/alert ?Behavior During Therapy: Anxious, Restless ?Overall Cognitive Status: Within Functional Limits for tasks assessed ?  ?  ?  ?  ?  ?  ?  ?  ?  ?  ?  ?  ?  ?  ?  ?  ?General Comments: AxO x 3 pleasant but also very anxious with issues of pain control. ?  ?  ? ?  ?Exercises   ? ?  ?General Comments   ?  ?  ? ?Pertinent Vitals/Pain Pain Assessment ?Pain Assessment: 0-10 ?Pain Score: 8  ?Pain Location: R hip and thigh ?Pain Descriptors / Indicators: Operative site guarding, Tender, Tightness ?Pain  Intervention(s): Monitored during session, Premedicated before session, Repositioned, Ice applied  ? ? ?Home Living   ?  ?  ?  ?  ?  ?  ?  ?  ?  ?   ?  ?Prior Function    ?  ?  ?   ? ?PT Goals (current goals can now be found in the care plan section) Progress towards PT goals: Progressing toward goals ? ?  ?Frequency ? ? ? 7X/week ? ? ? ?  ?PT Plan Current plan remains appropriate  ? ? ?Co-evaluation   ?  ?  ?  ?  ? ?  ?AM-PAC PT "6 Clicks" Mobility   ?Outcome Measure ? Help needed turning from your back to your side while in a flat bed without using bedrails?: A Little ?Help needed moving from lying on your back to sitting on the side of a flat bed without using bedrails?: A Little ?Help needed moving to and from a bed to a chair (including a wheelchair)?: A Little ?Help needed standing up from a chair using your arms (e.g., wheelchair or bedside chair)?: A Little ?Help needed to walk in hospital room?: A Lot ?Help needed climbing 3-5 steps with a railing? : Total ?6 Click Score: 15 ? ?  ?End of Session Equipment Utilized During Treatment: Gait belt ?Activity Tolerance: Patient limited by pain;Other (comment) (anxiety) ?Patient left: in chair;with call bell/phone within reach;with chair alarm set ?Nurse Communication: Mobility status ?PT Visit Diagnosis: Other abnormalities of gait and mobility (R26.89) ?  ? ? ?Time: 1443-1540 ?PT Time Calculation (min) (ACUTE ONLY): 35 min ? ?Charges:  $Gait Training: 8-22 mins ?$Therapeutic Activity: 8-22 mins          ?          ? ?{Nehal Shives  PTA ?Acute  Rehabilitation Services ?Pager      [phone] ?Office      [phone] ? ?

## 2022-02-10 NOTE — Plan of Care (Signed)
  Problem: Coping: Goal: Level of anxiety will decrease Outcome: Progressing   Problem: Pain Managment: Goal: General experience of comfort will improve Outcome: Progressing   Problem: Safety: Goal: Ability to remain free from injury will improve Outcome: Progressing   

## 2022-02-11 LAB — CBC
HCT: 23.1 % — ABNORMAL LOW (ref 36.0–46.0)
Hemoglobin: 7.8 g/dL — ABNORMAL LOW (ref 12.0–15.0)
MCH: 30.8 pg (ref 26.0–34.0)
MCHC: 33.8 g/dL (ref 30.0–36.0)
MCV: 91.3 fL (ref 80.0–100.0)
Platelets: 136 10*3/uL — ABNORMAL LOW (ref 150–400)
RBC: 2.53 MIL/uL — ABNORMAL LOW (ref 3.87–5.11)
RDW: 14.7 % (ref 11.5–15.5)
WBC: 6.1 10*3/uL (ref 4.0–10.5)
nRBC: 0 % (ref 0.0–0.2)

## 2022-02-11 LAB — GLUCOSE, CAPILLARY
Glucose-Capillary: 162 mg/dL — ABNORMAL HIGH (ref 70–99)
Glucose-Capillary: 170 mg/dL — ABNORMAL HIGH (ref 70–99)

## 2022-02-11 MED ORDER — OXYCODONE HCL 15 MG PO TABS: 15.0000 mg | ORAL_TABLET | ORAL | 0 refills | Status: AC | PRN

## 2022-02-11 MED ORDER — OXYCODONE HCL 5 MG PO TABS
15.0000 mg | ORAL_TABLET | ORAL | Status: DC | PRN
  Administered 2022-02-11 (×2): 15 mg via ORAL
  Filled 2022-02-11 (×3): qty 3

## 2022-02-11 NOTE — Progress Notes (Signed)
? ? ?  Subjective: ? ?Patient reports pain as moderate to severe.  Denies N/V/CP/SOB. Pain 8/10 with PT. Did not pass PT yesterday due to slow progress. ? ?Objective:  ? ?VITALS:   ?Vitals:  ? 02/10/22 2123 02/10/22 2150 02/11/22 0012 02/11/22 1610  ?BP: (!) 159/93  (!) 151/97 (!) 165/85  ?Pulse: (!) 102 (!) 118 96 100  ?Resp: 16 16 18 16   ?Temp: 99.4 ?F (37.4 ?C)  99 ?F (37.2 ?C) 98.6 ?F (37 ?C)  ?TempSrc: Oral  Oral Oral  ?SpO2: 98% 99% 99% 98%  ?Weight:      ?Height:      ? ? ?NAD ?ABD soft ?Sensation intact distally ?Intact pulses distally ?Dorsiflexion/Plantar flexion intact ?Incision: dressing C/D/I ? ? ?Lab Results  ?Component Value Date  ? WBC 6.1 02/11/2022  ? HGB 7.8 (L) 02/11/2022  ? HCT 23.1 (L) 02/11/2022  ? MCV 91.3 02/11/2022  ? PLT 136 (L) 02/11/2022  ? ?BMET ?   ?Component Value Date/Time  ? NA 133 (L) 02/09/2022 0331  ? NA 126 (L) 10/19/2020 1121  ? K 3.7 02/09/2022 0331  ? CL 105 02/09/2022 0331  ? CO2 21 (L) 02/09/2022 0331  ? GLUCOSE 165 (H) 02/09/2022 0331  ? BUN 12 02/09/2022 0331  ? BUN 8 10/19/2020 1121  ? CREATININE 0.95 02/09/2022 0331  ? CREATININE 0.83 02/09/2017 1013  ? CALCIUM 7.6 (L) 02/09/2022 0331  ? GFRNONAA >60 02/09/2022 0331  ? ? ? ?Assessment/Plan: ?3 Days Post-Op  ? ?Principal Problem: ?  Osteoarthritis of right hip ?Active Problems: ?  Primary osteoarthritis of right hip ? ? ?WBAT with walker ?DVT ppx: Aspirin, SCDs, TEDS ?PO pain control ?PT/OT ?ABLA: hgb 7.8 today, received 1 unit PRBCs, monitor ?Dispo: D/c home w HEP after clears PT ? ? ?Hilton Cork Charly Hunton ?02/11/2022, 8:11 AM ? ? ?Rod Can, MD ?([phone] ?[facility] ?[address], [address], [place], [place] [address] ?Phone: [phone] ?[email or web address] ?Facebook  Engineer, structural  ?  ? ? ?

## 2022-02-11 NOTE — Progress Notes (Signed)
Physical Therapy Treatment ?Patient Details ?Name: Paige Andrews ?MRN: [id number] ?DOB: October 11, 1956 ?Today's Date: 02/11/2022 ? ? ?History of Present Illness 66 yo s/p RDATHA with PMH of depression, biplor, anemia, GERD, R shoulder surgery, ADD ? ?  ?PT Comments  ? ? The patient  and daughter instructed with teach back technique for steps and bed moBility. Patient improved  in reports of pain, still with sharp pains in groin. ? Right  thigh with moderate edema and noted bruising  . ? Patient  and daughter agreeable to Dc. Patient can benefit from HHPT but MD said no.  ? Patient has a leg lifter at home and has 2 belts to assist her with exercises and mobility.  ?Recommendations for follow up therapy are one component of a multi-disciplinary discharge planning process, led by the attending physician.  Recommendations may be updated based on patient status, additional functional criteria and insurance authorization. ? ?Follow Up Recommendations ? Follow physician's recommendations for discharge plan and follow up therapies ?  ?  ?Assistance Recommended at Discharge Intermittent Supervision/Assistance  ?Patient can return home with the following A little help with bathing/dressing/bathroom;A little help with walking and/or transfers;Help with stairs or ramp for entrance;Assistance with cooking/housework;Assist for transportation ?  ?Equipment Recommendations ? None recommended by PT  ?  ?Recommendations for Other Services   ? ? ?  ?Precautions / Restrictions Precautions ?Precautions: Fall  ?  ? ?Mobility ? Bed Mobility ?  ?Bed Mobility: Supine to Sit, Sit to Supine ?  ?  ?Supine to sit: Min guard ?  ?  ?General bed mobility comments: daughter  present, patient used leg lifter to get onto and off of bed to move the right leg. Patient indicating sharp right hip pain when moving., ?  ? ?Transfers ?  ?Equipment used: Rolling walker (2 wheels) ?Transfers: Sit to/from Stand ?Sit to Stand: Min guard ?  ?  ?  ?  ?  ?General transfer  comment: placed foot stool beside the bed to assist with scooting onto bed after sitting. ?  ? ?Ambulation/Gait ?Ambulation/Gait assistance: Supervision ?Gait Distance (Feet): 50 Feet ?Assistive device: Rolling walker (2 wheels) ?Gait Pattern/deviations: Step-through pattern, Step-to pattern, Antalgic ?  ?  ?  ?General Gait Details: patient with stops and reports spasms right leg ? ? ?Stairs ?Stairs: Yes ?Stairs assistance: Min assist ?Stair Management: Forwards, No rails, With walker ?Number of Stairs: 5 ?General stair comments: daughter instructed to stand behind patient, patient going forward with RW on the  steops to enter, practiced x 2. ? ? ?Wheelchair Mobility ?  ? ?Modified Rankin (Stroke Patients Only) ?  ? ? ?  ?Balance   ?Sitting-balance support: Bilateral upper extremity supported, Feet supported ?Sitting balance-Leahy Scale: Good ?  ?  ?Standing balance support: Bilateral upper extremity supported, During functional activity, Reliant on assistive device for balance ?Standing balance-Leahy Scale: Fair ?  ?  ?  ?  ?  ?  ?  ?  ?  ?  ?  ?  ?  ? ?  ?Cognition Arousal/Alertness: Awake/alert ?Behavior During Therapy: Anxious ?  ?  ?  ?  ?  ?  ?  ?  ?  ?  ?  ?  ?  ?  ?  ?  ?  ?General Comments: AxO x 3 pleasant but also very anxious with issues of pain control and right hip pain. ?  ?  ? ?  ?Exercises Reviewed verbally and handout  for THA exercises. ? ?  ?General  Comments   ?  ?  ? ?Pertinent Vitals/Pain Pain Assessment ?Pain Score: 7  ?Pain Location: R hip and thigh, ?Pain Descriptors / Indicators: Operative site guarding, Tender, Tightness, Cramping, Moaning, Jabbing ?Pain Intervention(s): Monitored during session, Premedicated before session  ? ? ?Home Living   ?  ?  ?  ?  ?  ?  ?  ?  ?  ?   ?  ?Prior Function    ?  ?  ?   ? ?PT Goals (current goals can now be found in the care plan section) Progress towards PT goals: Progressing toward goals ? ?  ?Frequency ? ? ? 7X/week ? ? ? ?  ?PT Plan Current plan  remains appropriate  ? ? ?Co-evaluation   ?  ?  ?  ?  ? ?  ?AM-PAC PT "6 Clicks" Mobility   ?Outcome Measure ? Help needed turning from your back to your side while in a flat bed without using bedrails?: A Little ?Help needed moving from lying on your back to sitting on the side of a flat bed without using bedrails?: A Little ?Help needed moving to and from a bed to a chair (including a wheelchair)?: A Little ?Help needed standing up from a chair using your arms (e.g., wheelchair or bedside chair)?: A Little ?Help needed to walk in hospital room?: A Little ?Help needed climbing 3-5 steps with a railing? : A Little ?6 Click Score: 18 ? ?  ?End of Session Equipment Utilized During Treatment: Gait belt ?Activity Tolerance: Patient tolerated treatment well ?Patient left: in bed;with call bell/phone within reach;with family/visitor present ?Nurse Communication: Mobility status ?PT Visit Diagnosis: Other abnormalities of gait and mobility (R26.89) ?  ? ? ?Time: 5686-1683 ?PT Time Calculation (min) (ACUTE ONLY): 30 min ? ?Charges:  $Gait Training: 8-22 mins ?$Self Care/Home Management: 8-22          ?          ? ?Paige Andrews PT ?Acute Rehabilitation Services ?Pager [phone] ?Office [phone] ? ? ? ?Paige Andrews ?02/11/2022, 1:40 PM ? ?

## 2022-02-11 NOTE — Plan of Care (Signed)
Discharge instructions given to the patient including medications. ?

## 2022-02-11 NOTE — Plan of Care (Signed)
?  Problem: Health Behavior/Discharge Planning: ?Goal: Ability to manage health-related needs will improve ?Outcome: Progressing ?  ?Problem: Activity: ?Goal: Risk for activity intolerance will decrease ?Outcome: Progressing ?  ?Problem: Pain Managment: ?Goal: General experience of comfort will improve ?Outcome: Progressing ?  ?Problem: Safety: ?Goal: Ability to remain free from injury will improve ?Outcome: Progressing ?  ?

## 2022-02-11 NOTE — Progress Notes (Signed)
Physical Therapy Treatment ?Patient Details ?Name: Paige Andrews ?MRN: [id number] ?DOB: 1956/03/21 ?Today's Date: 02/11/2022 ? ? ?History of Present Illness 66 yo s/p RDATHA with PMH of depression, biplor, anemia, GERD, R shoulder surgery, ADD ? ?  ?PT Comments  ? ? Patient  did well on steps when able to hold 2 rails, as going  up to Bed and bath.  ? Will return  to practice steps and mobility when daughter is here, then  should be able to DC home.   ?Recommendations for follow up therapy are one component of a multi-disciplinary discharge planning process, led by the attending physician.  Recommendations may be updated based on patient status, additional functional criteria and insurance authorization. ? ?Follow Up Recommendations ? Follow physician's recommendations for discharge plan and follow up therapies ?  ?  ?Assistance Recommended at Discharge Intermittent Supervision/Assistance  ?Patient can return home with the following A little help with bathing/dressing/bathroom;A little help with walking and/or transfers;Help with stairs or ramp for entrance;Assistance with cooking/housework;Assist for transportation ?  ?Equipment Recommendations ? None recommended by PT  ?  ?Recommendations for Other Services   ? ? ?  ?Precautions / Restrictions Precautions ?Precautions: Fall ?Restrictions ?Weight Bearing Restrictions: No  ?  ? ?Mobility ? Bed Mobility ?  ?Bed Mobility: Supine to Sit ?  ?  ?Supine to sit: Min guard ?  ?  ?General bed mobility comments: practiced using leg lifter  onto high bed30"). Patient still required assistance with the right leg, patient crying out with C/O pain and spasms. ?  ? ?Transfers ?  ?Equipment used: Rolling walker (2 wheels) ?Transfers: Sit to/from Stand ?Sit to Stand: Supervision ?  ?  ?  ?  ?  ?General transfer comment: from recliner and toilet ?  ? ?Ambulation/Gait ?Ambulation/Gait assistance: Supervision ?Gait Distance (Feet): 40 Feet ?Assistive device: Rolling walker (2 wheels) ?Gait  Pattern/deviations: Step-through pattern, Step-to pattern, Antalgic ?  ?  ?  ?General Gait Details: patient with stops and reports spasms right leg ? ? ?Stairs ?Stairs: Yes ?Stairs assistance: Mod assist ?Stair Management: Two rails ?Number of Stairs: 5 ?General stair comments: patient  does well with 2 rails and min assistnace for safty ? ? ?Wheelchair Mobility ?  ? ?Modified Rankin (Stroke Patients Only) ?  ? ? ?  ?Balance   ?Sitting-balance support: Bilateral upper extremity supported, Feet supported ?Sitting balance-Leahy Scale: Good ?  ?  ?Standing balance support: Bilateral upper extremity supported, During functional activity, Reliant on assistive device for balance ?Standing balance-Leahy Scale: Fair ?  ?  ?  ?  ?  ?  ?  ?  ?  ?  ?  ?  ?  ? ?  ?Cognition Arousal/Alertness: Awake/alert ?Behavior During Therapy: Anxious, Impulsive ?  ?  ?  ?  ?  ?  ?  ?  ?  ?  ?  ?  ?  ?  ?  ?  ?  ?General Comments: AxO x 3 pleasant but also very anxious with issues of pain control and right hip pain. ?  ?  ? ?  ?Exercises   ?General Comments   ?  ?  ? ?Pertinent Vitals/Pain Pain Assessment ?Pain Score: 8  ?Pain Location: R hip and thigh, back on right ?Pain Descriptors / Indicators: Operative site guarding, Tender, Tightness, Cramping, Moaning, Jabbing ?Pain Intervention(s): Premedicated before session, Monitored during session  ? ? ?Home Living   ?  ?  ?  ?  ?  ?  ?  ?  ?  ?   ?  ?  Prior Function    ?  ?  ?   ? ?PT Goals (current goals can now be found in the care plan section) Progress towards PT goals: Progressing toward goals ? ?  ?Frequency ? ? ? 7X/week ? ? ? ?  ?PT Plan Current plan remains appropriate  ? ? ?Co-evaluation   ?  ?  ?  ?  ? ?  ?AM-PAC PT "6 Clicks" Mobility   ?Outcome Measure ? Help needed turning from your back to your side while in a flat bed without using bedrails?: A Little ?Help needed moving from lying on your back to sitting on the side of a flat bed without using bedrails?: A Little ?Help needed  moving to and from a bed to a chair (including a wheelchair)?: A Little ?Help needed standing up from a chair using your arms (e.g., wheelchair or bedside chair)?: A Little ?Help needed to walk in hospital room?: A Little ?Help needed climbing 3-5 steps with a railing? : A Little ?6 Click Score: 18 ? ?  ?End of Session Equipment Utilized During Treatment: Gait belt ?Activity Tolerance: Patient limited by pain;Other (comment);Patient tolerated treatment well ?Patient left: with call bell/phone within reach;in bed;with bed alarm set ?Nurse Communication: Mobility status ?PT Visit Diagnosis: Other abnormalities of gait and mobility (R26.89) ?  ? ? ?Time: 8469-6295 ?PT Time Calculation (min) (ACUTE ONLY): 10 min ? ?Charges:  $Gait Training: 8-22 mins ?Tresa Endo PT ?Acute Rehabilitation Services ?Pager [phone] ?Office [phone] ?         ?          ? ? ? ?[name] ?02/11/2022, 10:49 AM ? ?

## 2022-02-11 NOTE — TOC Transition Note (Addendum)
Transition of Care (TOC) - CM/SW Discharge Note ? ? ?Patient Details  ?Name: Paige Andrews ?MRN: [id number] ?Date of Birth: 1956/06/05 ? ?Transition of Care (TOC) CM/SW Contact:  ?Ross Ludwig, LCSW ?Phone Number: ?02/11/2022, 10:43 AM ? ? ?Clinical Narrative:    ? ? ?Transition of Care (TOC) Screening Note ? ? ?Patient Details  ?Name: Paige Andrews ?Date of Birth: 06/19/1956 ? ? ?Transition of Care (TOC) CM/SW Contact:    ?Ross Ludwig, LCSW ?Phone Number: ?02/11/2022, 10:43 AM ? ? ? ?Transition of Care Department [facility]) has reviewed patient and no TOC needs have been identified at this time. We will continue to monitor patient advancement through interdisciplinary progression rounds. If new patient transition needs arise, please place a TOC consult. ?  ?3/4 1:14pm  CSW was informed that patient needs a rolling walker.  CSW requested order for walker, CSW spoke to [place], they can provide the walker, and will deliver to room before patient leaves. ? ? ? ?Final next level of care: Home/Self Care ?Barriers to Discharge: No Barriers Identified ? ? ?Patient Goals and CMS Choice ?Patient states their goals for this hospitalization and ongoing recovery are:: Discharge home with HEP ?  ?Choice offered to / list presented to : NA ? ?Discharge Placement ?  ?           ?  ?  ?  ?  ? ?Discharge Plan and Services ?  ?  ?           ?DME Arranged: N/A ?DME Agency: NA ?  ?  ?  ?  ?  ?  ?  ?  ? ?Social Determinants of Health (SDOH) Interventions ?  ? ? ?Readmission Risk Interventions ?No flowsheet data found. ? ? ? ? ?

## 2022-02-11 NOTE — Progress Notes (Signed)
Physical Therapy Treatment ?Patient Details ?Name: Paige Andrews ?MRN: [id number] ?DOB: March 02, 1956 ?Today's Date: 02/11/2022 ? ? ?History of Present Illness 66 yo s/p RDATHA with PMH of depression, biplor, anemia, GERD, R shoulder surgery, ADD ? ?  ?PT Comments  ? ?  The patient reports spasms and pain  right hip and  groin. Patient premediciated . Patient  has ost difficulty with moving leg to get to bed edge and sitting  up. Provided a leg lifter to use for home or use of belt around  the right foot. The patient is not safe going backwards on steps as there will only be her daughter. Paige Andrews forward with Rw wa safer.  Will return to practice steps with rail and  return to high bed like hers a home.  ?Recommendations for follow up therapy are one component of a multi-disciplinary discharge planning process, led by the attending physician.  Recommendations may be updated based on patient status, additional functional criteria and insurance authorization. ? ?Follow Up Recommendations ?  Per MD, recommend HHPT ?  ?  ?Assistance Recommended at Discharge Intermittent Supervision/Assistance  ?Patient can return home with the following A little help with bathing/dressing/bathroom;A little help with walking and/or transfers;Help with stairs or ramp for entrance;Assistance with cooking/housework;Assist for transportation ?  ?Equipment Recommendations ? None recommended by PT  ?  ?Recommendations for Other Services   ? ? ?  ?Precautions / Restrictions Precautions ?Precautions: Fall ?Restrictions ?Weight Bearing Restrictions: No  ?  ? ?Mobility ? Bed Mobility ?  ?Bed Mobility: Supine to Sit ?  ?  ?Supine to sit: Min guard ?  ?  ?General bed mobility comments: demonstarted and instructed how to use leg lifter to self assist R LE off bed, still required assistance,  as her anxiety increased with pain.  Impulsive, stood  when PT stepped away, ?  ? ?Transfers ?  ?Equipment used: Rolling walker (2 wheels) ?Transfers: Sit to/from  Stand ?Sit to Stand: Supervision ?  ?  ?  ?  ?  ?General transfer comment: from bed ?  ? ?Ambulation/Gait ?Ambulation/Gait assistance: Supervision ?Gait Distance (Feet): 40 Feet (then 20') ?Assistive device: Rolling walker (2 wheels) ?Gait Pattern/deviations: Step-through pattern, Step-to pattern, Antalgic ?  ?  ?  ?General Gait Details: patient with stops and reports spasms right leg ? ? ?Stairs ?Stairs: Yes ?Stairs assistance: Mod assist ?Stair Management: No rails, Backwards, Forwards, With walker ?  ?General stair comments: performed backward , mod assist and neded support from back, then  performed going  forward with RW on steps in front, paient  with improved steadiness. ? ? ?Wheelchair Mobility ?  ? ?Modified Rankin (Stroke Patients Only) ?  ? ? ?  ?Balance   ?  ?  ?  ?  ?  ?  ?  ?  ?  ?  ?  ?  ?  ?  ?  ?  ?  ?  ?  ? ?  ?Cognition Arousal/Alertness: Awake/alert ?Behavior During Therapy: Anxious, Restless ?  ?  ?  ?  ?  ?  ?  ?  ?  ?  ?  ?  ?  ?  ?  ?  ?  ?General Comments: AxO x 3 pleasant but also very anxious with issues of pain control and right hip pain. ?  ?  ? ?  ?Exercises Total Joint Exercises ?Heel Slides: AAROM, Supine, Right, 5 reps ?Hip ABduction/ADduction: AAROM, Supine, Right, 10 reps ? ?  ?General Comments   ?  ?  ? ?  Pertinent Vitals/Pain Pain Assessment ?Pain Score: 8  ?Pain Location: R hip and thigh, back on right ?Pain Descriptors / Indicators: Operative site guarding, Tender, Tightness, Cramping, Moaning, Jabbing ?Pain Intervention(s): Premedicated before session, Monitored during session  ? ? ?Home Living   ?  ?  ?  ?  ?  ?  ?  ?  ?  ?   ?  ?Prior Function    ?  ?  ?   ? ?PT Goals (current goals can now be found in the care plan section) Progress towards PT goals: Progressing toward goals ? ?  ?Frequency ? ? ? 7X/week ? ? ? ?  ?PT Plan Current plan remains appropriate  ? ? ?Co-evaluation   ?  ?  ?  ?  ? ?  ?AM-PAC PT "6 Clicks" Mobility   ?Outcome Measure ? Help needed turning from your  back to your side while in a flat bed without using bedrails?: A Little ?Help needed moving from lying on your back to sitting on the side of a flat bed without using bedrails?: A Little ?Help needed moving to and from a bed to a chair (including a wheelchair)?: A Little ?Help needed standing up from a chair using your arms (e.g., wheelchair or bedside chair)?: A Little ?Help needed to walk in hospital room?: A Little ?Help needed climbing 3-5 steps with a railing? : A Lot ?6 Click Score: 17 ? ?  ?End of Session Equipment Utilized During Treatment: Gait belt ?Activity Tolerance: Patient limited by pain;Other (comment) ?Patient left: in chair;with call bell/phone within reach ?Nurse Communication: Mobility status ?PT Visit Diagnosis: Other abnormalities of gait and mobility (R26.89) ?  ? ? ?Time: 4103-0131 ?PT Time Calculation (min) (ACUTE ONLY): 24 min ? ?Charges:  $Gait Training: 8-22 mins ?$Self Care/Home Management: 8-22          ?          ? ?Paige Andrews PT ?Acute Rehabilitation Services ?Pager [phone] ?Office [phone] ? ? ?Paige Andrews ?02/11/2022, 10:43 AM ? ?

## 2022-02-13 ENCOUNTER — Emergency Department (HOSPITAL_COMMUNITY)
Admission: EM | Admit: 2022-02-13 | Discharge: 2022-02-13 | Disposition: A | Discharge status: Home / Self Care | Payer: Medicare Other | Attending: Emergency Medicine | Admitting: Emergency Medicine

## 2022-02-13 ENCOUNTER — Other Ambulatory Visit: Payer: Self-pay

## 2022-02-13 ENCOUNTER — Emergency Department (HOSPITAL_COMMUNITY): Payer: Medicare Other

## 2022-02-13 ENCOUNTER — Encounter (HOSPITAL_COMMUNITY): Payer: Self-pay | Admitting: Emergency Medicine

## 2022-02-13 DIAGNOSIS — R000 Tachycardia, unspecified: Secondary | ICD-10-CM | POA: Diagnosis not present

## 2022-02-13 DIAGNOSIS — Z7984 Long term (current) use of oral hypoglycemic drugs: Secondary | ICD-10-CM | POA: Diagnosis not present

## 2022-02-13 DIAGNOSIS — G8918 Other acute postprocedural pain: Secondary | ICD-10-CM | POA: Diagnosis not present

## 2022-02-13 DIAGNOSIS — Z96641 Presence of right artificial hip joint: Secondary | ICD-10-CM | POA: Diagnosis not present

## 2022-02-13 DIAGNOSIS — J45909 Unspecified asthma, uncomplicated: Secondary | ICD-10-CM | POA: Insufficient documentation

## 2022-02-13 DIAGNOSIS — I10 Essential (primary) hypertension: Secondary | ICD-10-CM | POA: Diagnosis not present

## 2022-02-13 DIAGNOSIS — Z7982 Long term (current) use of aspirin: Secondary | ICD-10-CM | POA: Insufficient documentation

## 2022-02-13 DIAGNOSIS — E119 Type 2 diabetes mellitus without complications: Secondary | ICD-10-CM | POA: Insufficient documentation

## 2022-02-13 DIAGNOSIS — M25551 Pain in right hip: Secondary | ICD-10-CM | POA: Insufficient documentation

## 2022-02-13 DIAGNOSIS — Z79899 Other long term (current) drug therapy: Secondary | ICD-10-CM | POA: Diagnosis not present

## 2022-02-13 LAB — BPAM RBC
Blood Product Expiration Date: 202303212359
ISSUE DATE / TIME: 202303031111
Unit Type and Rh: 6200

## 2022-02-13 LAB — CBC WITH DIFFERENTIAL/PLATELET
Abs Immature Granulocytes: 0.03 10*3/uL (ref 0.00–0.07)
Basophils Absolute: 0 10*3/uL (ref 0.0–0.1)
Basophils Relative: 1 %
Eosinophils Absolute: 0.2 10*3/uL (ref 0.0–0.5)
Eosinophils Relative: 3 %
HCT: 23.4 % — ABNORMAL LOW (ref 36.0–46.0)
Hemoglobin: 7.8 g/dL — ABNORMAL LOW (ref 12.0–15.0)
Immature Granulocytes: 1 %
Lymphocytes Relative: 17 %
Lymphs Abs: 1 10*3/uL (ref 0.7–4.0)
MCH: 30.1 pg (ref 26.0–34.0)
MCHC: 33.3 g/dL (ref 30.0–36.0)
MCV: 90.3 fL (ref 80.0–100.0)
Monocytes Absolute: 0.7 10*3/uL (ref 0.1–1.0)
Monocytes Relative: 11 %
Neutro Abs: 4.3 10*3/uL (ref 1.7–7.7)
Neutrophils Relative %: 67 %
Platelets: 181 10*3/uL (ref 150–400)
RBC: 2.59 MIL/uL — ABNORMAL LOW (ref 3.87–5.11)
RDW: 13.9 % (ref 11.5–15.5)
WBC: 6.3 10*3/uL (ref 4.0–10.5)
nRBC: 0 % (ref 0.0–0.2)

## 2022-02-13 LAB — COMPREHENSIVE METABOLIC PANEL
ALT: 18 U/L (ref 0–44)
AST: 31 U/L (ref 15–41)
Albumin: 3 g/dL — ABNORMAL LOW (ref 3.5–5.0)
Alkaline Phosphatase: 77 U/L (ref 38–126)
Anion gap: 7 (ref 5–15)
BUN: 7 mg/dL — ABNORMAL LOW (ref 8–23)
CO2: 23 mmol/L (ref 22–32)
Calcium: 8.7 mg/dL — ABNORMAL LOW (ref 8.9–10.3)
Chloride: 101 mmol/L (ref 98–111)
Creatinine, Ser: 0.46 mg/dL (ref 0.44–1.00)
GFR, Estimated: >60 mL/min (ref >60)
Glucose, Bld: 179 mg/dL — ABNORMAL HIGH (ref 70–99)
Potassium: 3.9 mmol/L (ref 3.5–5.1)
Sodium: 131 mmol/L — ABNORMAL LOW (ref 135–145)
Total Bilirubin: 0.9 mg/dL (ref 0.3–1.2)
Total Protein: 6.1 g/dL — ABNORMAL LOW (ref 6.5–8.1)

## 2022-02-13 LAB — TYPE AND SCREEN
ABO/RH(D): A POS
Antibody Screen: NEGATIVE
Unit division: 0

## 2022-02-13 LAB — C-REACTIVE PROTEIN: CRP: 5.7 mg/dL — ABNORMAL HIGH (ref <1.0)

## 2022-02-13 MED ORDER — KETOROLAC TROMETHAMINE 30 MG/ML IJ SOLN
15.0000 mg | Freq: Once | INTRAMUSCULAR | Status: AC
Start: 2022-02-13 — End: 2022-02-13
  Administered 2022-02-13: 15 mg via INTRAVENOUS
  Filled 2022-02-13: qty 1

## 2022-02-13 MED ORDER — ONDANSETRON HCL 4 MG/2ML IJ SOLN
4.0000 mg | Freq: Once | INTRAMUSCULAR | Status: AC
  Administered 2022-02-13: 4 mg via INTRAVENOUS
  Filled 2022-02-13: qty 2

## 2022-02-13 MED ORDER — POLYETHYLENE GLYCOL 3350 17 G PO PACK: 17.0000 g | PACK | Freq: Every day | ORAL | 0 refills | Status: AC

## 2022-02-13 MED ORDER — OXYCODONE HCL 5 MG PO TABS
5.0000 mg | ORAL_TABLET | Freq: Once | ORAL | Status: AC
  Administered 2022-02-13: 5 mg via ORAL
  Filled 2022-02-13: qty 1

## 2022-02-13 MED ORDER — HYDROMORPHONE HCL 1 MG/ML IJ SOLN
1.0000 mg | Freq: Once | INTRAMUSCULAR | Status: AC
  Administered 2022-02-13: 1 mg via INTRAVENOUS
  Filled 2022-02-13: qty 1

## 2022-02-13 MED ORDER — SODIUM CHLORIDE 0.9 % IV BOLUS
1000.0000 mL | Freq: Once | INTRAVENOUS | Status: AC
  Administered 2022-02-13: 1000 mL via INTRAVENOUS

## 2022-02-13 NOTE — ED Provider Notes (Signed)
[facility] DEPT Provider Note   CSN: [id number] Arrival date & time: 02/13/22  0334     History  Chief Complaint  Patient presents with   Hip Pain    Paige Andrews is a 66 y.o. female.  This is a 66 y.o. female with significant medical history as below, including bipolar 1 disorder, celiac disease, hypertension, hyperlipidemia, IBS who presents to the ED with complaint of right-sided hip pain.  Patient is status post total hip 3/1 Dr. Lyla Glassing.  She is been having ongoing pain since the procedure.  She has been taking analgesics as recommended.  Pain is worsened.  Difficulty ambulating secondary to the discomfort.  She has follow-up with Dr. Lyla Glassing in another week.  Did not attempt to contact surgical team regarding her ongoing discomfort.  No fevers, chills, nausea or vomiting.  No numbness or tingling.  No drainage from the wound.  No changes of bowel or bladder function from her baseline.  No back pain, no chest pain or dyspnea, no palpitations.     Past Medical History: No date: ADD (attention deficit disorder) No date: Anemia No date: Anxiety No date: Arthritis No date: Asthma No date: Binge eating disorder No date: Bipolar 1 disorder (HCC) No date: Celiac disease No date: Chronic lumbar pain No date: Depression No date: Diabetes (HCC) No date: GERD (gastroesophageal reflux disease) No date: Hypercholesterolemia No date: Hypertension No date: Hyperthyroidism No date: IBS (irritable bowel syndrome) No date: Migraine No date: OSA (obstructive sleep apnea)     Comment:  NO CPAP CURRENTLY No date: Pneumonia No date: PTSD (post-traumatic stress disorder)  Past Surgical History: No date: BILATERAL CATARACT SURGERY  No date: carpel tunnel; Right No date: CESAREAN SECTION No date: CESAREAN SECTION     Comment:  X 2 No date: COLPOSCOPY No date: HAND SURGERY     Comment:  X 2 ON RIGHT No date: SHOULDER SURGERY; Right 02/08/2022: TOTAL  HIP ARTHROPLASTY; Right     Comment:  Procedure: TOTAL HIP ARTHROPLASTY ANTERIOR APPROACH;                Surgeon: Rod Can, MD;  Location: [facility] ORS;                Service: Orthopedics;  Laterality: Right;    The history is provided by the patient. No language interpreter was used.  Hip Pain Pertinent negatives include no chest pain, no abdominal pain, no headaches and no shortness of breath.      Home Medications Prior to Admission medications   Medication Sig Start Date End Date Taking? Authorizing Provider  acetaminophen (TYLENOL) 500 MG tablet Take 1,000 mg by mouth every 8 (eight) hours as needed for moderate pain.   Yes [provider]  alendronate (FOSAMAX) 70 MG tablet Take 70 mg by mouth every Friday. 01/24/21  Yes [provider]  aspirin 81 MG chewable tablet Chew 1 tablet (81 mg total) by mouth 2 (two) times daily. 02/09/22 03/26/22 Yes Swinteck, Aaron Edelman, MD  atorvastatin (LIPITOR) 80 MG tablet Take 1 tablet by mouth once daily 06/22/21  Yes Newlin, Enobong, MD  baclofen (LIORESAL) 10 MG tablet TAKE 1 TABLET BY MOUTH THREE TIMES DAILY Patient taking differently: 5 mg 3 (three) times daily. 06/08/21  Yes Charlott Rakes, MD  diphenhydrAMINE (BENADRYL) 25 MG tablet Take 25 mg by mouth every 6 (six) hours as needed for allergies.   Yes [provider]  fluticasone (FLOVENT HFA) 110 MCG/ACT inhaler Inhale 2 puffs  into the lungs 2 (two) times daily. 08/05/21  Yes Padgett, Rae Halsted, MD  gabapentin (NEURONTIN) 600 MG tablet Take 600 mg by mouth 3 (three) times daily. 03/23/21  Yes [provider]  glipiZIDE (GLUCOTROL) 5 MG tablet Take 0.5 tablets (2.5 mg total) by mouth 2 (two) times daily before a meal. 10/19/20  Yes Newlin, Enobong, MD  hydrOXYzine (ATARAX/VISTARIL) 50 MG tablet Take 50 mg by mouth 3 (three) times daily as needed for anxiety.   Yes [provider]  losartan (COZAAR) 50 MG tablet Take 50 mg by mouth daily. 12/05/21  Yes  [provider]  meclizine (ANTIVERT) 25 MG tablet Take 25 mg by mouth 3 (three) times daily as needed for dizziness.   Yes [provider]  metFORMIN (GLUCOPHAGE) 500 MG tablet Take 2 tablets (1,000 mg total) by mouth 2 (two) times daily with a meal. 10/19/20  Yes Newlin, Enobong, MD  methocarbamol (ROBAXIN) 500 MG tablet Take 1 tablet (500 mg total) by mouth every 6 (six) hours as needed for muscle spasms. 02/09/22  Yes Swinteck, Aaron Edelman, MD  ondansetron (ZOFRAN) 4 MG tablet Take 1 tablet (4 mg total) by mouth every 6 (six) hours as needed for nausea. 02/09/22  Yes Swinteck, Aaron Edelman, MD  oxyCODONE (ROXICODONE) 15 MG immediate release tablet Take 1 tablet (15 mg total) by mouth every 4 (four) hours as needed for severe pain. 02/11/22  Yes Swinteck, Aaron Edelman, MD  pantoprazole (PROTONIX) 40 MG tablet Take 1 tablet (40 mg total) by mouth daily. 10/19/20  Yes Newlin, Charlane Ferretti, MD  polyethylene glycol (MIRALAX / GLYCOLAX) 17 g packet Take 17 g by mouth daily as needed for mild constipation. 02/09/22  Yes Swinteck, Aaron Edelman, MD  polyethylene glycol (MIRALAX MIX-IN PAX) 17 g packet Take 17 g by mouth daily. 02/13/22  Yes Jeanell Sparrow, DO  PROAIR HFA 108 [phone] Base) MCG/ACT inhaler Inhale 2 puffs into the lungs every 6 (six) hours as needed for wheezing or shortness of breath. 11/24/20  Yes [provider]  rizatriptan (MAXALT) 10 MG tablet Take 10 mg by mouth as needed for migraine. 11/18/21  Yes [provider]  VYVANSE 70 MG capsule Take 70 mg by mouth every morning. 12/10/21  Yes [provider]  zolpidem (AMBIEN) 5 MG tablet TAKE 1 TABLET BY MOUTH AT BEDTIME AS NEEDED FOR SLEEP Patient taking differently: Take 5 mg by mouth at bedtime as needed for sleep. 08/18/21  Yes Charlott Rakes, MD  ACCU-CHEK SOFTCLIX LANCETS lancets Use as instructed to test sugar once daily 08/26/18   Charlott Rakes, MD  Blood Glucose Monitoring Suppl (ACCU-CHEK AVIVA PLUS) w/Device KIT Use as instructed to  test sugar once daily 07/23/18   Charlott Rakes, MD  docusate sodium (COLACE) 100 MG capsule Take 1 capsule (100 mg total) by mouth 2 (two) times daily. Patient not taking: Reported on 02/13/2022 02/09/22   Rod Can, MD  glucose blood (ACCU-CHEK AVIVA PLUS) test strip Use as instructed to test sugar once daily 07/23/18   Charlott Rakes, MD  Lancets Misc. (ACCU-CHEK FASTCLIX LANCET) KIT Use as directed once daily 07/29/18   Charlott Rakes, MD  magnesium oxide (MAG-OX) 400 (241.3 Mg) MG tablet Take 400 mg by mouth daily. Patient not taking: Reported on 02/13/2022 01/08/21   [provider]  ondansetron (ZOFRAN) 4 MG tablet Take 1 tablet (4 mg total) by mouth every 6 (six) hours. Patient taking differently: Take 4 mg by mouth every 8 (eight) hours as needed for nausea or  vomiting. 06/03/21   Sherrill Raring, PA-C  senna (SENOKOT) 8.6 MG TABS tablet Take 2 tablets (17.2 mg total) by mouth at bedtime. Patient not taking: Reported on 02/13/2022 02/09/22   Rod Can, MD  Spacer/Aero-Holding Josiah Lobo DEVI Take 1 each by mouth as directed. Rinse mouth after use. 08/05/21   Kennith Gain, MD      Allergies    Morphine and related    Review of Systems   Review of Systems  Constitutional:  Positive for activity change. Negative for fever.  HENT:  Negative for facial swelling and trouble swallowing.   Eyes:  Negative for discharge and redness.  Respiratory:  Negative for cough and shortness of breath.   Cardiovascular:  Negative for chest pain and palpitations.  Gastrointestinal:  Negative for abdominal pain and nausea.  Genitourinary:  Negative for dysuria and flank pain.  Musculoskeletal:  Positive for arthralgias. Negative for back pain and gait problem.  Skin:  Negative for pallor and rash.  Neurological:  Negative for syncope and headaches.   Physical Exam Updated Vital Signs BP (!) 154/96    Pulse 99    Temp 97.9 F (36.6 C) (Oral)    Resp 17    LMP 12/11/2014 (Approximate)     SpO2 98%  Physical Exam Vitals and nursing note reviewed.  Constitutional:      General: She is not in acute distress.    Appearance: Normal appearance. She is not ill-appearing.  HENT:     Head: Normocephalic and atraumatic.     Right Ear: External ear normal.     Left Ear: External ear normal.     Nose: Nose normal.     Mouth/Throat:     Mouth: Mucous membranes are moist.  Eyes:     General: No scleral icterus.       Right eye: No discharge.        Left eye: No discharge.  Cardiovascular:     Rate and Rhythm: Regular rhythm. Tachycardia present.     Pulses: Normal pulses.          Radial pulses are 2+ on the right side and 2+ on the left side.       Dorsalis pedis pulses are 2+ on the right side and 2+ on the left side.       Posterior tibial pulses are 2+ on the right side and 2+ on the left side.     Heart sounds: Normal heart sounds.  Pulmonary:     Effort: Pulmonary effort is normal. No respiratory distress.     Breath sounds: Normal breath sounds.  Abdominal:     General: Abdomen is flat.     Palpations: Abdomen is soft.     Tenderness: There is no abdominal tenderness.  Musculoskeletal:        General: Normal range of motion.     Cervical back: Normal range of motion.     Right lower leg: No edema.     Left lower leg: No edema.       Legs:     Comments: Tenderness at surgical site.  No crepitus or drainage.  There is no streaking, there is mild erythema consistent with recent surgical intervention. DP and PT pulses are equal and symmetric, LE sensation is equal and symmetric   Skin:    General: Skin is warm and dry.     Capillary Refill: Capillary refill takes less than 2 seconds.  Neurological:     Mental Status: She  is alert and oriented to person, place, and time.     GCS: GCS eye subscore is 4. GCS verbal subscore is 5. GCS motor subscore is 6.  Psychiatric:        Mood and Affect: Mood normal.        Behavior: Behavior normal.    ED Results /  Procedures / Treatments   Labs (all labs ordered are listed, but only abnormal results are displayed) Labs Reviewed  CBC WITH DIFFERENTIAL/PLATELET - Abnormal; Notable for the following components:      Result Value   RBC 2.59 (*)    Hemoglobin 7.8 (*)    HCT 23.4 (*)    All other components within normal limits  COMPREHENSIVE METABOLIC PANEL - Abnormal; Notable for the following components:   Sodium 131 (*)    Glucose, Bld 179 (*)    BUN 7 (*)    Calcium 8.7 (*)    Total Protein 6.1 (*)    Albumin 3.0 (*)    All other components within normal limits  C-REACTIVE PROTEIN - Abnormal; Notable for the following components:   CRP 5.7 (*)    All other components within normal limits    EKG EKG Interpretation  Date/Time:  Monday February 13 2022 05:11:04 EST Ventricular Rate:  101 PR Interval:  159 QRS Duration: 80 QT Interval:  349 QTC Calculation: 453 R Axis:   40 Text Interpretation: Sinus tachycardia Low voltage, precordial leads Confirmed by Wynona Dove ([id number]) on 02/13/2022 5:33:44 AM  Radiology DG Hip Unilat W or Wo Pelvis 2-3 Views Right  Result Date: 02/13/2022 CLINICAL DATA:  66 year old female with increasing pain following right hip replacement on 02/08/2022. EXAM: DG HIP (WITH OR WITHOUT PELVIS) 2-3V RIGHT COMPARISON:  02/08/2022 and earlier. FINDINGS: Right bipolar hip arthroplasty redemonstrated. Hardware appears intact and normally aligned on AP and cross-table lateral views. Expected postoperative changes to the proximal right femur. Pelvis intact. No acute osseous abnormality identified. Increased small and large bowel gas in the visible abdomen. IMPRESSION: 1. Stable Right hip arthroplasty with no adverse features identified. 2. Gas in nondilated small and large bowel.  Consider ileus. Electronically Signed   By: Genevie Ann M.D.   On: 02/13/2022 05:47    Procedures Procedures    Medications Ordered in ED Medications  oxyCODONE (Oxy IR/ROXICODONE) immediate release  tablet 5 mg (has no administration in time range)  HYDROmorphone (DILAUDID) injection 1 mg (1 mg Intravenous Given 02/13/22 0450)  sodium chloride 0.9 % bolus 1,000 mL (1,000 mLs Intravenous New Bag/Given 02/13/22 0451)  ondansetron (ZOFRAN) injection 4 mg (4 mg Intravenous Given 02/13/22 0450)  HYDROmorphone (DILAUDID) injection 1 mg (1 mg Intravenous Given 02/13/22 0625)  ketorolac (TORADOL) 30 MG/ML injection 15 mg (15 mg Intravenous Given 02/13/22 5537)    ED Course/ Medical Decision Making/ A&P                           Medical Decision Making Amount and/or Complexity of Data Reviewed Independent Historian:     Details: family External Data Reviewed: labs, radiology, ECG and notes. Labs: ordered. Decision-making details documented in ED Course. Radiology: ordered. Decision-making details documented in ED Course. ECG/medicine tests: ordered and independent interpretation performed. Decision-making details documented in ED Course.  Risk Prescription drug management. Decision regarding hospitalization.    CC: Right hip pain  This patient presents to the Emergency Department for the above complaint. This involves an extensive number of treatment options  and is a complaint that carries with it a high risk of complications and morbidity. Vital signs were reviewed. Serious etiologies considered.  Record review:  Previous records obtained and reviewed   Medical and surgical history as noted above.   Work up as above, notable for:  Labs & imaging results that were available during my care of the patient were visualized by me and considered in my medical decision making.   I ordered imaging studies which included right hip x-ray and I visualized the imaging and I agree with radiologist interpretation. Stable right hip, pos ileus (she has chronic constipation)  Cardiac monitoring reviewed and interpreted personally which shows sinus tachycardia initially then normal sinus rhythm.  Social  determinants of health include - N/a   Management: Analgesics, IV fluids  Reassessment:  Feeling better, low suspicion for acute post op infection.  Advised her to f/u with Ortho. Pain well controlled at this time.  LE neurovascularly intact. Wound dressing clean/dry.    The patient improved significantly and was discharged in stable condition. Detailed discussions were had with the patient regarding current findings, and need for close f/u with PCP or on call doctor. The patient has been instructed to return immediately if the symptoms worsen in any way for re-evaluation. Patient verbalized understanding and is in agreement with current care plan. All questions answered prior to discharge.         This chart was dictated using voice recognition software.  Despite best efforts to proofread,  errors can occur which can change the documentation meaning.         Final Clinical Impression(s) / ED Diagnoses Final diagnoses:  Post-op pain    Rx / DC Orders ED Discharge Orders          Ordered    polyethylene glycol (MIRALAX MIX-IN PAX) 17 g packet  Daily        02/13/22 0715              Jeanell Sparrow, DO 02/13/22 5701

## 2022-02-13 NOTE — Discharge Instructions (Addendum)
It was a pleasure caring for you today in the emergency department. ? ?Please follow-up with orthopedic specialist regarding ongoing postop hip pain ? ?Please return to the emergency department for any worsening or worrisome symptoms. ? ?

## 2022-02-13 NOTE — ED Triage Notes (Signed)
Patient had total right hip replacement on Wednesday with worsening pain since, unable to get up without assistance.  EMS reports patient was able to walk with assistance.  Patient has been taking pain medicine as prescribed.  ?

## 2022-02-21 ENCOUNTER — Encounter: Payer: Medicare Other | Admitting: Orthopaedic Surgery

## 2022-03-10 ENCOUNTER — Ambulatory Visit: Payer: Medicare Other

## 2022-08-15 ENCOUNTER — Other Ambulatory Visit: Payer: Self-pay | Admitting: Allergy

## 2022-08-30 IMAGING — MG MM DIGITAL SCREENING BILAT W/ TOMO AND CAD
6 of 10 series · 6 of 30 positions shown · non-contrast
Comparison: Previous exam(s).

CLINICAL DATA: Screening.

EXAM:
DIGITAL SCREENING BILATERAL MAMMOGRAM WITH TOMOSYNTHESIS AND CAD
TECHNIQUE: Bilateral screening digital craniocaudal and mediolateral oblique
mammograms were obtained. Bilateral screening digital breast
tomosynthesis was performed. The images were evaluated with
computer-aided detection.

[L CC synth-2D (1 of 2)]
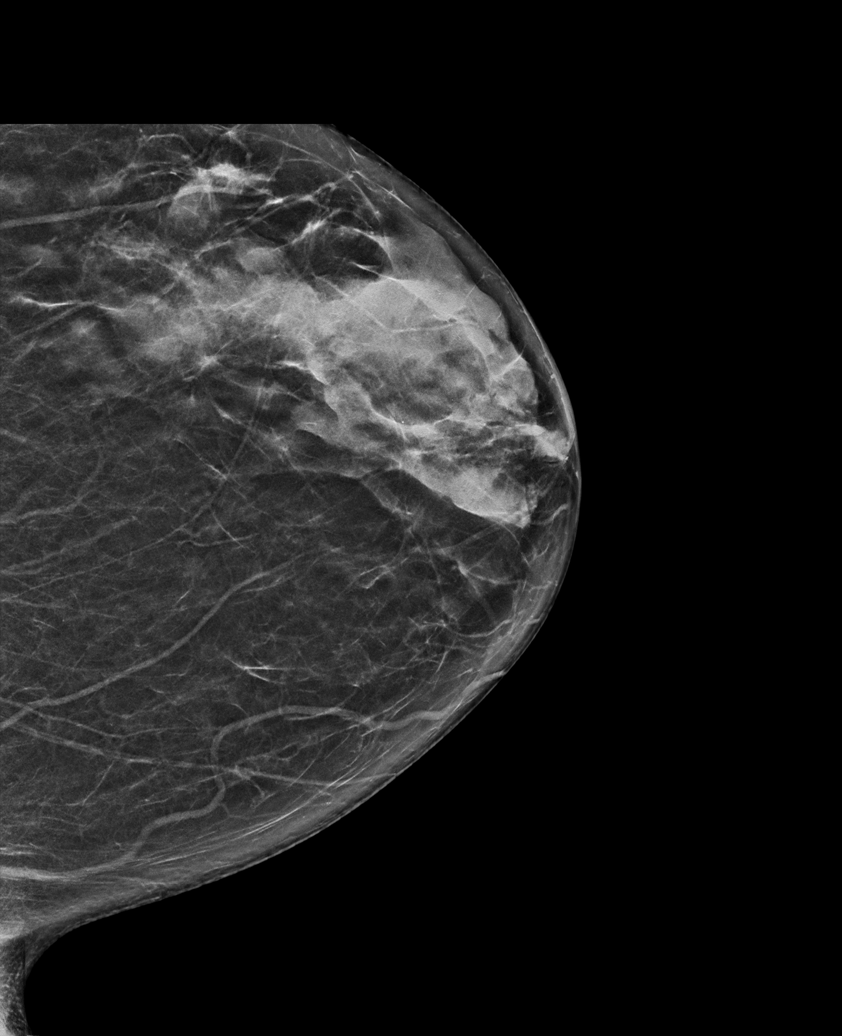

[L CC synth-2D (2 of 2)]
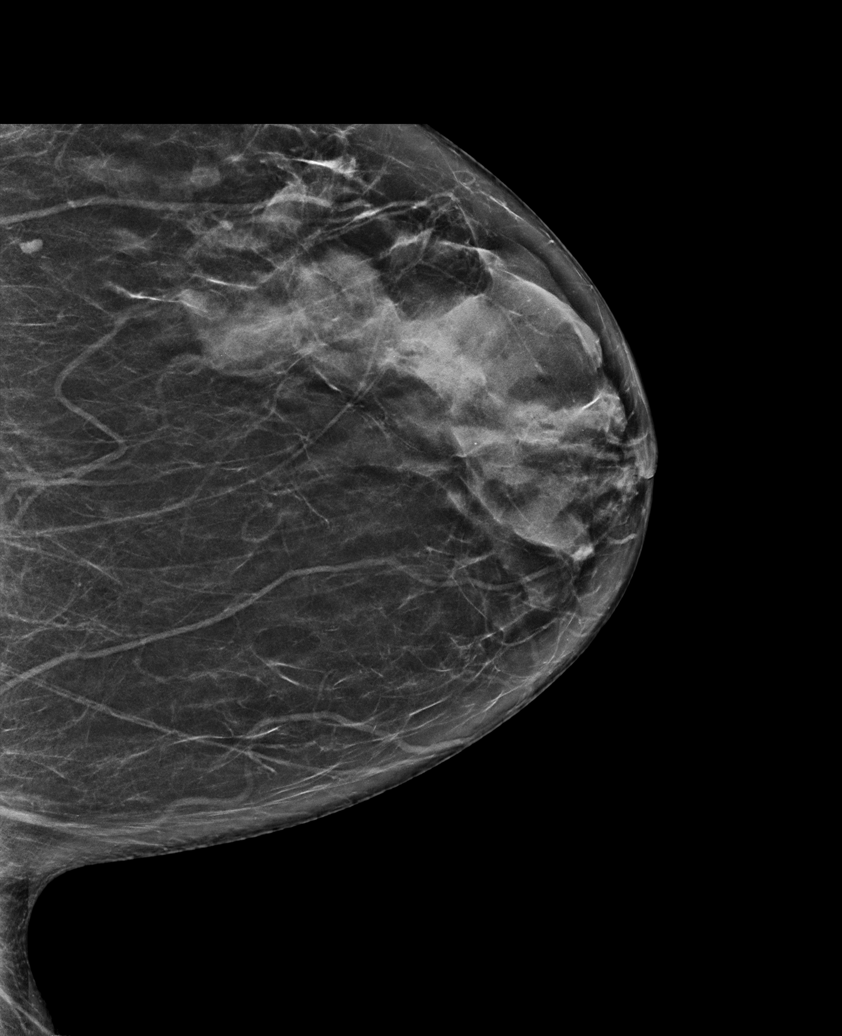

[R CC synth-2D]
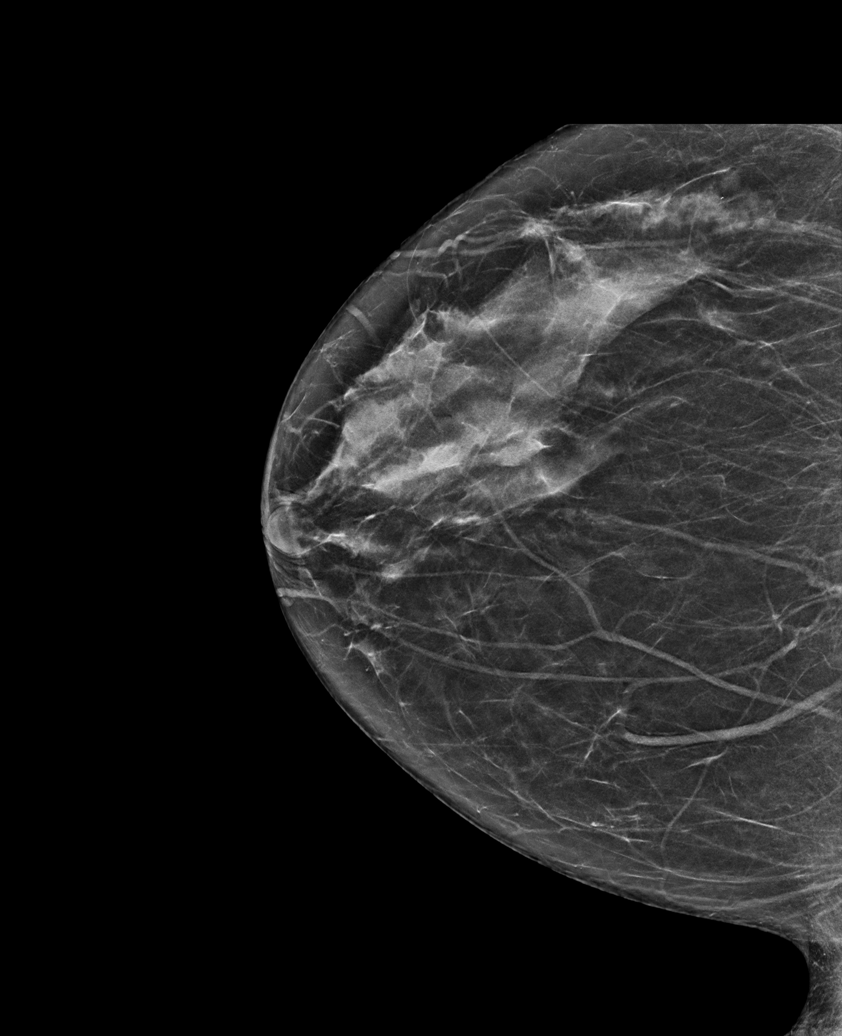

[R MLO synth-2D]
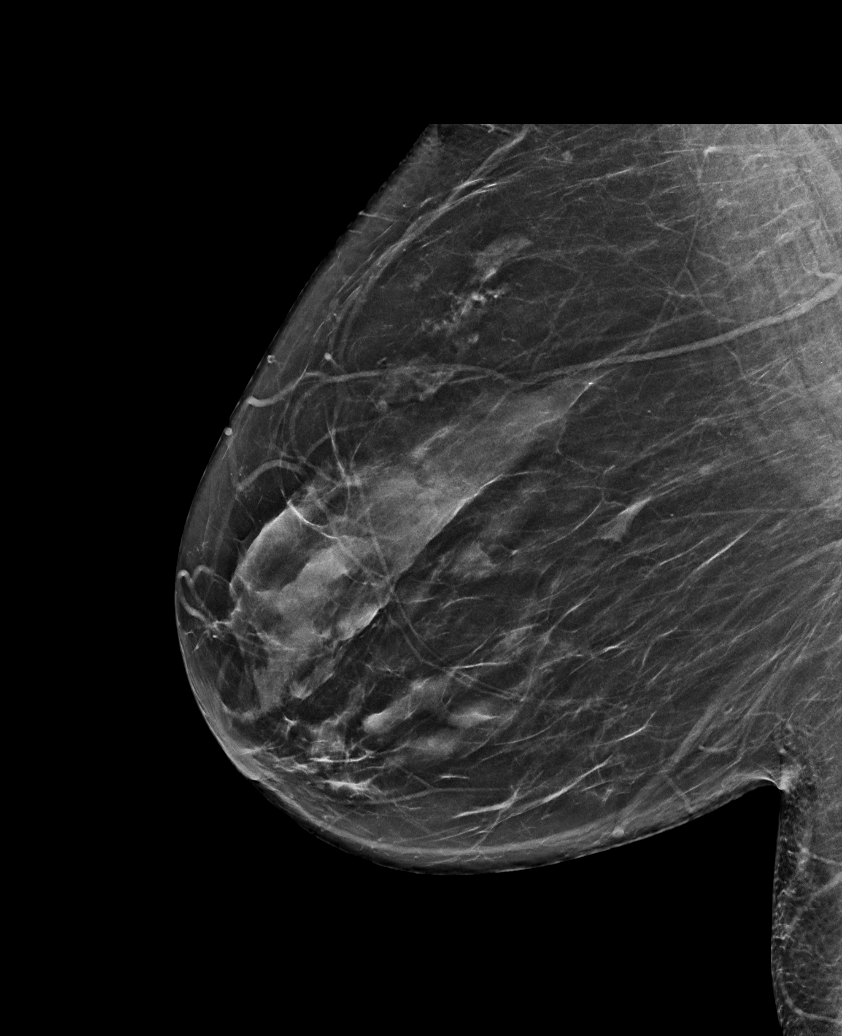

[L MLO synth-2D]
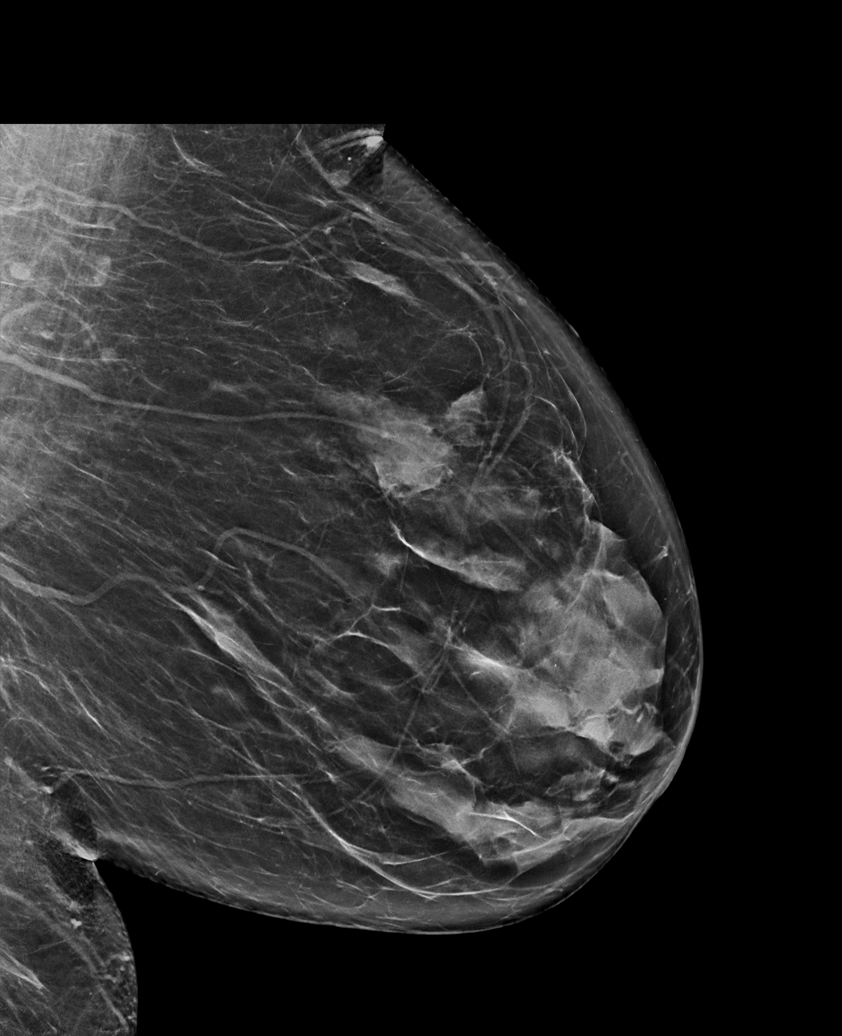

[R MLO tomo · tomo slice 33/66.0]
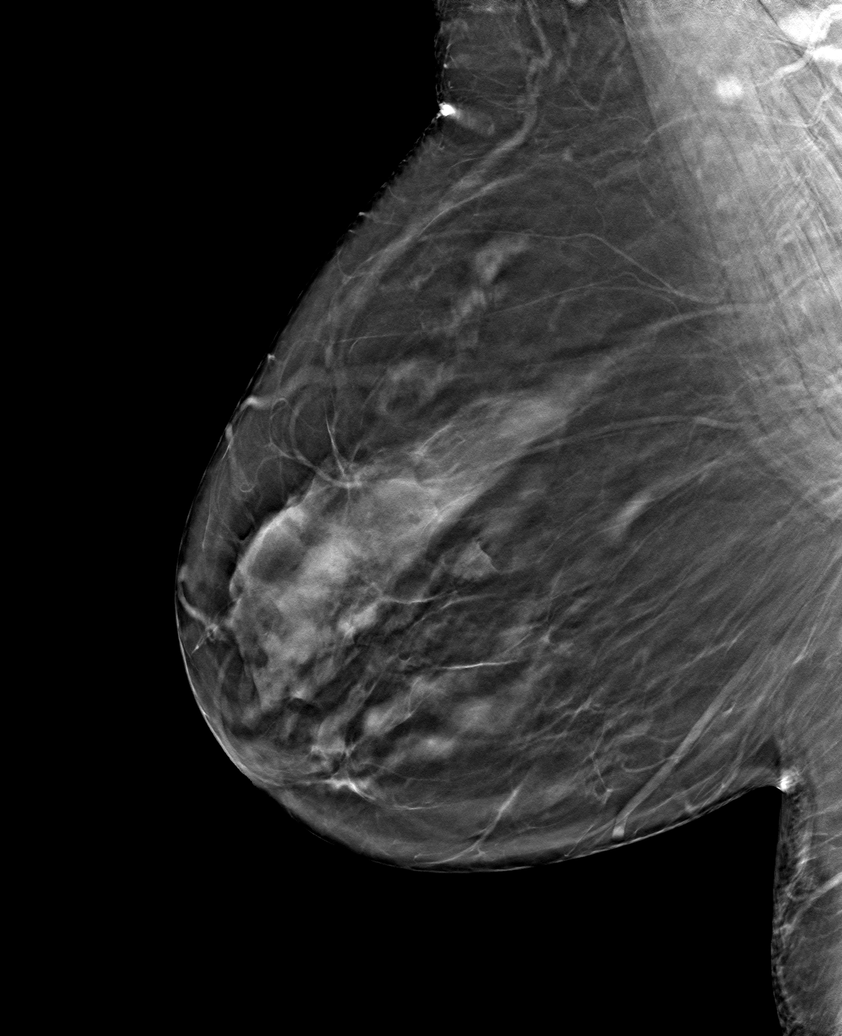

[6 of 30 positions shown; findings below may reference images not displayed]

ACR Breast Density Category c: The breast tissue is heterogeneously
dense, which may obscure small masses.
FINDINGS: There are no findings suspicious for malignancy.
IMPRESSION: No mammographic evidence of malignancy. A result letter of this
screening mammogram will be mailed directly to the patient.

RECOMMENDATION:
Screening mammogram in one year. (Code:Q3-W-BC3)

BI-RADS CATEGORY  1: Negative.

## 2022-10-27 ENCOUNTER — Ambulatory Visit: Payer: Medicare Other | Admitting: Internal Medicine

## 2023-02-13 ENCOUNTER — Other Ambulatory Visit: Payer: Self-pay | Admitting: Endocrinology

## 2023-02-13 DIAGNOSIS — Z1231 Encounter for screening mammogram for malignant neoplasm of breast: Secondary | ICD-10-CM

## 2023-03-16 ENCOUNTER — Ambulatory Visit (HOSPITAL_COMMUNITY): Payer: Medicare Other | Admitting: Physician Assistant

## 2023-03-30 ENCOUNTER — Ambulatory Visit: Payer: Medicare Other

## 2023-04-13 ENCOUNTER — Inpatient Hospital Stay (HOSPITAL_COMMUNITY)
Admission: EM | Admit: 2023-04-13 | Discharge: 2023-04-15 | DRG: 149 | Disposition: A | Discharge status: Home / Self Care | Payer: Medicare Other | Attending: Internal Medicine | Admitting: Internal Medicine

## 2023-04-13 ENCOUNTER — Emergency Department (HOSPITAL_COMMUNITY): Payer: Medicare Other

## 2023-04-13 ENCOUNTER — Encounter (HOSPITAL_COMMUNITY): Payer: Self-pay

## 2023-04-13 ENCOUNTER — Other Ambulatory Visit: Payer: Self-pay

## 2023-04-13 DIAGNOSIS — Z8249 Family history of ischemic heart disease and other diseases of the circulatory system: Secondary | ICD-10-CM

## 2023-04-13 DIAGNOSIS — Z7984 Long term (current) use of oral hypoglycemic drugs: Secondary | ICD-10-CM

## 2023-04-13 DIAGNOSIS — Z803 Family history of malignant neoplasm of breast: Secondary | ICD-10-CM

## 2023-04-13 DIAGNOSIS — M25561 Pain in right knee: Secondary | ICD-10-CM | POA: Diagnosis present

## 2023-04-13 DIAGNOSIS — K900 Celiac disease: Secondary | ICD-10-CM | POA: Diagnosis present

## 2023-04-13 DIAGNOSIS — K219 Gastro-esophageal reflux disease without esophagitis: Secondary | ICD-10-CM | POA: Diagnosis present

## 2023-04-13 DIAGNOSIS — E1142 Type 2 diabetes mellitus with diabetic polyneuropathy: Secondary | ICD-10-CM | POA: Diagnosis present

## 2023-04-13 DIAGNOSIS — Z9181 History of falling: Secondary | ICD-10-CM

## 2023-04-13 DIAGNOSIS — Z823 Family history of stroke: Secondary | ICD-10-CM

## 2023-04-13 DIAGNOSIS — E785 Hyperlipidemia, unspecified: Secondary | ICD-10-CM | POA: Diagnosis present

## 2023-04-13 DIAGNOSIS — F4310 Post-traumatic stress disorder, unspecified: Secondary | ICD-10-CM | POA: Diagnosis present

## 2023-04-13 DIAGNOSIS — Z79899 Other long term (current) drug therapy: Secondary | ICD-10-CM

## 2023-04-13 DIAGNOSIS — F319 Bipolar disorder, unspecified: Secondary | ICD-10-CM | POA: Diagnosis present

## 2023-04-13 DIAGNOSIS — R112 Nausea with vomiting, unspecified: Secondary | ICD-10-CM

## 2023-04-13 DIAGNOSIS — Z820 Family history of epilepsy and other diseases of the nervous system: Secondary | ICD-10-CM

## 2023-04-13 DIAGNOSIS — E119 Type 2 diabetes mellitus without complications: Secondary | ICD-10-CM

## 2023-04-13 DIAGNOSIS — N3000 Acute cystitis without hematuria: Secondary | ICD-10-CM | POA: Diagnosis present

## 2023-04-13 DIAGNOSIS — Z885 Allergy status to narcotic agent status: Secondary | ICD-10-CM

## 2023-04-13 DIAGNOSIS — Z825 Family history of asthma and other chronic lower respiratory diseases: Secondary | ICD-10-CM

## 2023-04-13 DIAGNOSIS — J4540 Moderate persistent asthma, uncomplicated: Secondary | ICD-10-CM | POA: Diagnosis present

## 2023-04-13 DIAGNOSIS — Z96641 Presence of right artificial hip joint: Secondary | ICD-10-CM | POA: Diagnosis present

## 2023-04-13 DIAGNOSIS — R7401 Elevation of levels of liver transaminase levels: Secondary | ICD-10-CM | POA: Diagnosis present

## 2023-04-13 DIAGNOSIS — I10 Essential (primary) hypertension: Secondary | ICD-10-CM | POA: Diagnosis present

## 2023-04-13 DIAGNOSIS — K589 Irritable bowel syndrome without diarrhea: Secondary | ICD-10-CM | POA: Diagnosis present

## 2023-04-13 DIAGNOSIS — R42 Dizziness and giddiness: Secondary | ICD-10-CM | POA: Diagnosis not present

## 2023-04-13 DIAGNOSIS — E7800 Pure hypercholesterolemia, unspecified: Secondary | ICD-10-CM | POA: Diagnosis present

## 2023-04-13 DIAGNOSIS — Z7983 Long term (current) use of bisphosphonates: Secondary | ICD-10-CM

## 2023-04-13 DIAGNOSIS — M25511 Pain in right shoulder: Secondary | ICD-10-CM | POA: Diagnosis present

## 2023-04-13 DIAGNOSIS — Z808 Family history of malignant neoplasm of other organs or systems: Secondary | ICD-10-CM

## 2023-04-13 LAB — URINALYSIS, ROUTINE W REFLEX MICROSCOPIC
Bacteria, UA: NONE SEEN
Bilirubin Urine: NEGATIVE
Glucose, UA: >=500 mg/dL — AB
Hgb urine dipstick: NEGATIVE
Ketones, ur: 20 mg/dL — AB
Nitrite: NEGATIVE
Protein, ur: NEGATIVE mg/dL
Specific Gravity, Urine: 1.014 (ref 1.005–1.030)
WBC, UA: >50 WBC/hpf (ref 0–5)
pH: 7 (ref 5.0–8.0)

## 2023-04-13 LAB — COMPREHENSIVE METABOLIC PANEL
ALT: 63 U/L — ABNORMAL HIGH (ref 0–44)
AST: 56 U/L — ABNORMAL HIGH (ref 15–41)
Albumin: 4.2 g/dL (ref 3.5–5.0)
Alkaline Phosphatase: 86 U/L (ref 38–126)
Anion gap: 12 (ref 5–15)
BUN: 11 mg/dL (ref 8–23)
CO2: 22 mmol/L (ref 22–32)
Calcium: 9.7 mg/dL (ref 8.9–10.3)
Chloride: 99 mmol/L (ref 98–111)
Creatinine, Ser: 0.73 mg/dL (ref 0.44–1.00)
GFR, Estimated: >60 mL/min (ref >60)
Glucose, Bld: 225 mg/dL — ABNORMAL HIGH (ref 70–99)
Potassium: 3.6 mmol/L (ref 3.5–5.1)
Sodium: 133 mmol/L — ABNORMAL LOW (ref 135–145)
Total Bilirubin: 1.7 mg/dL — ABNORMAL HIGH (ref 0.3–1.2)
Total Protein: 7.2 g/dL (ref 6.5–8.1)

## 2023-04-13 LAB — CBC WITH DIFFERENTIAL/PLATELET
Abs Immature Granulocytes: 0.03 10*3/uL (ref 0.00–0.07)
Basophils Absolute: 0.1 10*3/uL (ref 0.0–0.1)
Basophils Relative: 1 %
Eosinophils Absolute: 0 10*3/uL (ref 0.0–0.5)
Eosinophils Relative: 1 %
HCT: 37.6 % (ref 36.0–46.0)
Hemoglobin: 13.1 g/dL (ref 12.0–15.0)
Immature Granulocytes: 0 %
Lymphocytes Relative: 10 %
Lymphs Abs: 0.8 10*3/uL (ref 0.7–4.0)
MCH: 29.8 pg (ref 26.0–34.0)
MCHC: 34.8 g/dL (ref 30.0–36.0)
MCV: 85.5 fL (ref 80.0–100.0)
Monocytes Absolute: 0.3 10*3/uL (ref 0.1–1.0)
Monocytes Relative: 4 %
Neutro Abs: 6.2 10*3/uL (ref 1.7–7.7)
Neutrophils Relative %: 84 %
Platelets: 161 10*3/uL (ref 150–400)
RBC: 4.4 MIL/uL (ref 3.87–5.11)
RDW: 11.6 % (ref 11.5–15.5)
WBC: 7.4 10*3/uL (ref 4.0–10.5)
nRBC: 0 % (ref 0.0–0.2)

## 2023-04-13 LAB — CBG MONITORING, ED: Glucose-Capillary: 213 mg/dL — ABNORMAL HIGH (ref 70–99)

## 2023-04-13 LAB — MAGNESIUM: Magnesium: 1.6 mg/dL — ABNORMAL LOW (ref 1.7–2.4)

## 2023-04-13 LAB — LIPASE, BLOOD: Lipase: 28 U/L (ref 11–51)

## 2023-04-13 MED ORDER — ONDANSETRON HCL 4 MG/2ML IJ SOLN: 4.0000 mg | Freq: Once | INTRAMUSCULAR | Status: DC

## 2023-04-13 MED ORDER — DIAZEPAM 5 MG/ML IJ SOLN
2.5000 mg | Freq: Once | INTRAMUSCULAR | Status: AC
  Administered 2023-04-13: 2.5 mg via INTRAVENOUS
  Filled 2023-04-13: qty 2

## 2023-04-13 MED ORDER — METOCLOPRAMIDE HCL 5 MG/ML IJ SOLN
10.0000 mg | Freq: Once | INTRAMUSCULAR | Status: AC
  Administered 2023-04-13: 10 mg via INTRAVENOUS
  Filled 2023-04-13: qty 2

## 2023-04-13 MED ORDER — LACTATED RINGERS IV BOLUS
1000.0000 mL | Freq: Once | INTRAVENOUS | Status: AC
  Administered 2023-04-13: 1000 mL via INTRAVENOUS

## 2023-04-13 MED ORDER — SODIUM CHLORIDE 0.9 % IV SOLN
1.0000 g | Freq: Once | INTRAVENOUS | Status: AC
  Administered 2023-04-14: 1 g via INTRAVENOUS
  Filled 2023-04-13: qty 10

## 2023-04-13 MED ORDER — MECLIZINE HCL 25 MG PO TABS
25.0000 mg | ORAL_TABLET | Freq: Once | ORAL | Status: DC
  Filled 2023-04-13: qty 1

## 2023-04-13 MED ORDER — ONDANSETRON HCL 4 MG/2ML IJ SOLN
4.0000 mg | Freq: Once | INTRAMUSCULAR | Status: AC
  Administered 2023-04-13: 4 mg via INTRAVENOUS
  Filled 2023-04-13: qty 2

## 2023-04-13 MED ORDER — SODIUM CHLORIDE 0.9 % IV SOLN: 1.0000 g | Freq: Once | INTRAVENOUS | Status: DC

## 2023-04-13 MED ORDER — MECLIZINE HCL 25 MG PO TABS
25.0000 mg | ORAL_TABLET | Freq: Once | ORAL | Status: AC
  Administered 2023-04-13: 25 mg via ORAL
  Filled 2023-04-13: qty 1

## 2023-04-13 MED ORDER — MECLIZINE HCL 25 MG PO TABS: 25.0000 mg | ORAL_TABLET | Freq: Once | ORAL | Status: DC

## 2023-04-13 NOTE — ED Notes (Signed)
Triage completed. Pt resting in bed, awaiting to be seen by MD.

## 2023-04-13 NOTE — ED Provider Notes (Signed)
[place] EMERGENCY DEPARTMENT AT [facility] Provider Note   CSN: [id number] Arrival date & time: 04/13/23  1547     History {Add pertinent medical, surgical, social history, OB history to HPI:1} Chief Complaint  Patient presents with   Nausea    Paige Andrews is a 67 y.o. female with HTN, asthma, GERD, IBS, bipolar 1, anxiety, nausea/vomiting, h/o transaminitis, who presents with nausea.   BIBEMS from home with symptoms of vertigo/dizziness that started yesterday. Saw doctor yesterday morning for regular appointment and had some of those symptoms but got worse and worse and now has been nauseated/vomiting at home with severe symptoms, worse with head movement. +Room spinning sensation, worse with movement of head. Endorse sensation of hot/cold. Denies asymmetric weakness. Had a fall coming out of the bathroom yesterday, didn't hit her head but fell onto right shoulder/right knee, has pain in both. Able to move arm, no numbness/tingling. Had BPPV once before which felt like this, lasted one week, ended up getting admitted to hospital and it just got better. Denies fevers, chest pain, abd pain, urinary symptoms.   HPI     Home Medications Prior to Admission medications   Medication Sig Start Date End Date Taking? Authorizing Provider  ACCU-CHEK SOFTCLIX LANCETS lancets Use as instructed to test sugar once daily 08/26/18   Hoy Register, MD  acetaminophen (TYLENOL) 500 MG tablet Take 1,000 mg by mouth every 8 (eight) hours as needed for moderate pain.    [provider]  alendronate (FOSAMAX) 70 MG tablet Take 70 mg by mouth every Friday. 01/24/21   [provider]  atorvastatin (LIPITOR) 80 MG tablet Take 1 tablet by mouth once daily 06/22/21   Hoy Register, MD  baclofen (LIORESAL) 10 MG tablet TAKE 1 TABLET BY MOUTH THREE TIMES DAILY Patient taking differently: 5 mg 3 (three) times daily. 06/08/21   Hoy Register, MD  Blood Glucose Monitoring Suppl  (ACCU-CHEK AVIVA PLUS) w/Device KIT Use as instructed to test sugar once daily 07/23/18   Hoy Register, MD  diphenhydrAMINE (BENADRYL) 25 MG tablet Take 25 mg by mouth every 6 (six) hours as needed for allergies.    [provider]  docusate sodium (COLACE) 100 MG capsule Take 1 capsule (100 mg total) by mouth 2 (two) times daily. Patient not taking: Reported on 02/13/2022 02/09/22   Samson Frederic, MD  fluticasone (FLOVENT HFA) 110 MCG/ACT inhaler Inhale 2 puffs into the lungs 2 (two) times daily. 08/05/21   Marcelyn Bruins, MD  gabapentin (NEURONTIN) 600 MG tablet Take 600 mg by mouth 3 (three) times daily. 03/23/21   [provider]  glipiZIDE (GLUCOTROL) 5 MG tablet Take 0.5 tablets (2.5 mg total) by mouth 2 (two) times daily before a meal. 10/19/20   Hoy Register, MD  glucose blood (ACCU-CHEK AVIVA PLUS) test strip Use as instructed to test sugar once daily 07/23/18   Hoy Register, MD  hydrOXYzine (ATARAX/VISTARIL) 50 MG tablet Take 50 mg by mouth 3 (three) times daily as needed for anxiety.    [provider]  Lancets Misc. (ACCU-CHEK FASTCLIX LANCET) KIT Use as directed once daily 07/29/18   Hoy Register, MD  losartan (COZAAR) 50 MG tablet Take 50 mg by mouth daily. 12/05/21   [provider]  magnesium oxide (MAG-OX) 400 (241.3 Mg) MG tablet Take 400 mg by mouth daily. Patient not taking: Reported on 02/13/2022 01/08/21   [provider]  meclizine (ANTIVERT) 25 MG tablet Take 25 mg by mouth 3 (three)  times daily as needed for dizziness.    [provider]  metFORMIN (GLUCOPHAGE) 500 MG tablet Take 2 tablets (1,000 mg total) by mouth 2 (two) times daily with a meal. 10/19/20   Hoy Register, MD  methocarbamol (ROBAXIN) 500 MG tablet Take 1 tablet (500 mg total) by mouth every 6 (six) hours as needed for muscle spasms. 02/09/22   Swinteck, Arlys John, MD  ondansetron (ZOFRAN) 4 MG tablet Take 1 tablet (4 mg total) by mouth every 6 (six)  hours. Patient taking differently: Take 4 mg by mouth every 8 (eight) hours as needed for nausea or vomiting. 06/03/21   Theron Arista, PA-C  ondansetron (ZOFRAN) 4 MG tablet Take 1 tablet (4 mg total) by mouth every 6 (six) hours as needed for nausea. 02/09/22   Swinteck, Arlys John, MD  oxyCODONE (ROXICODONE) 15 MG immediate release tablet Take 1 tablet (15 mg total) by mouth every 4 (four) hours as needed for severe pain. 02/11/22   Swinteck, Arlys John, MD  pantoprazole (PROTONIX) 40 MG tablet Take 1 tablet (40 mg total) by mouth daily. 10/19/20   Hoy Register, MD  polyethylene glycol (MIRALAX / GLYCOLAX) 17 g packet Take 17 g by mouth daily as needed for mild constipation. 02/09/22   Swinteck, Arlys John, MD  polyethylene glycol (MIRALAX MIX-IN PAX) 17 g packet Take 17 g by mouth daily. 02/13/22   Sloan Leiter, DO  PROAIR HFA 108 [id number] Base) MCG/ACT inhaler Inhale 2 puffs into the lungs every 6 (six) hours as needed for wheezing or shortness of breath. 11/24/20   [provider]  rizatriptan (MAXALT) 10 MG tablet Take 10 mg by mouth as needed for migraine. 11/18/21   [provider]  senna (SENOKOT) 8.6 MG TABS tablet Take 2 tablets (17.2 mg total) by mouth at bedtime. Patient not taking: Reported on 02/13/2022 02/09/22   Samson Frederic, MD  Spacer/Aero-Holding Deretha Emory DEVI Take 1 each by mouth as directed. Rinse mouth after use. 08/05/21   Padgett, Pilar Grammes, MD  VYVANSE 70 MG capsule Take 70 mg by mouth every morning. 12/10/21   [provider]  zolpidem (AMBIEN) 5 MG tablet TAKE 1 TABLET BY MOUTH AT BEDTIME AS NEEDED FOR SLEEP Patient taking differently: Take 5 mg by mouth at bedtime as needed for sleep. 08/18/21   Hoy Register, MD      Allergies    Morphine and related    Review of Systems   Review of Systems Review of systems Positive for falls, negative for head trauma.  A 10 point review of systems was performed and is negative unless otherwise reported in HPI.  Physical  Exam Updated Vital Signs Temp 98.1 F (36.7 C) (Oral)   Ht 5\' 4"  (1.626 m)   Wt 68.5 kg   LMP 12/11/2014 (Approximate)   SpO2 98%   BMI 25.92 kg/m  Physical Exam General: uncomfortable appearing female, lying in bed, moaning  HEENT: PERRLA, EOMI, no nystagmus, Sclera anicteric, MMM, trachea midline.  Cardiology: RRR, no murmurs/rubs/gallops. BL radial and DP pulses equal bilaterally.  Resp: Normal respiratory rate and effort. CTAB, no wheezes, rhonchi, crackles.  Abd: Soft, non-tender, non-distended. No rebound tenderness or guarding.  GU: Deferred. MSK: No peripheral edema or signs of trauma. Extremities without deformity or TTP. No cyanosis or clubbing. Skin: warm, dry. No rashes or lesions. Back: No CVA tenderness Neuro: A&Ox4, CNs II-XII grossly intact. MAEs. Sensation grossly intact.  Psych: Normal mood and affect.   ED Results / Procedures / Treatments  Labs (all labs ordered are listed, but only abnormal results are displayed) Labs Reviewed - No data to display  EKG None  Radiology No results found.  Procedures Procedures  {Document cardiac monitor, telemetry assessment procedure when appropriate:1}  Medications Ordered in ED Medications - No data to display  ED Course/ Medical Decision Making/ A&P                          Medical Decision Making   This patient presents to the ED for concern of ***, this involves an extensive number of treatment options, and is a complaint that carries with it a high risk of complications and morbidity.  I considered the following differential and admission for this acute, potentially life threatening condition.   MDM:    If peripheral vertigo, consider BPPV, meniere's syndrome, labyrinthitis, vestibular neuritis, ear foreign body, or head trauma. If central, consider posterior stroke, brain tumor, vertebral artery dissection, basilar migraine, vertebrobasilar insufficiency, demyelinating disorder, infections such as  neurosyphilis or tuberculosis.  Other causes include anemia, hyperviscosity syndrome, toxic (alcohol, aminoglycosides), metabolic such as thyroid disease or hypoglycemia.  HINTS exam:  -Head impulse test (When the head is turned towards the normal side, the vestibular ocular reflex remains intact and eyes continue to fixate on the visual target     When the head is turned towards the affected side, the vestibular ocular reflex fails and the eyes make a corrective saccade to re-fixate on the visual target [8][9]     Normally, a functional vestibular system will identify any movement of the head position and rapidly correct eye movement accordingly so that the center of the vision remains on a target.         This reflex fails in peripheral causes of vertigo affecting the vestibulocochlear nerve     It is reassuring if the reflex is abnormal (due to dysfunction of the peripheral nerve) -Nystagmus -Test of skew      Labs: I Ordered, and personally interpreted labs.  The pertinent results include:  ***  Imaging Studies ordered: I ordered imaging studies including *** I independently visualized and interpreted imaging. I agree with the radiologist interpretation  Additional history obtained from ***.  External records from outside source obtained and reviewed including ***  Cardiac Monitoring: The patient was maintained on a cardiac monitor.  I personally viewed and interpreted the cardiac monitored which showed an underlying rhythm of: ***  Reevaluation: After the interventions noted above, I reevaluated the patient and found that they have :{resolved/improved/worsened:23923::"improved"}  Social Determinants of Health: ***  Disposition:  ***  Co morbidities that complicate the patient evaluation  Past Medical History:  Diagnosis Date   ADD (attention deficit disorder)    Anemia    Anxiety    Arthritis    Asthma    Binge eating disorder    Bipolar 1 disorder (HCC)    Celiac  disease    Chronic lumbar pain    Depression    Diabetes (HCC)    GERD (gastroesophageal reflux disease)    Hypercholesterolemia    Hypertension    Hyperthyroidism    IBS (irritable bowel syndrome)    Migraine    OSA (obstructive sleep apnea)    NO CPAP CURRENTLY   Pneumonia    PTSD (post-traumatic stress disorder)      Medicines No orders of the defined types were placed in this encounter.   I have reviewed the patients home medicines and  have made adjustments as needed  Problem List / ED Course: Problem List Items Addressed This Visit   None        {Document critical care time when appropriate:1} {Document review of labs and clinical decision tools ie heart score, Chads2Vasc2 etc:1}  {Document your independent review of radiology images, and any outside records:1} {Document your discussion with family members, caretakers, and with consultants:1} {Document social determinants of health affecting pt's care:1} {Document your decision making why or why not admission, treatments were needed:1}  This note was created using dictation software, which may contain spelling or grammatical errors.

## 2023-04-13 NOTE — ED Triage Notes (Signed)
Arrived via EMS from home, hx of vertigo. N/V. Dizziness started yesterday. Had a fall yesterday at home, R shoulder pain and knee.

## 2023-04-13 NOTE — ED Provider Notes (Incomplete)
[place] EMERGENCY DEPARTMENT AT [facility] Provider Note   CSN: [id number] Arrival date & time: 04/13/23  1547     History {Add pertinent medical, surgical, social history, OB history to HPI:1} Chief Complaint  Patient presents with  . Nausea    Paige Andrews is a 67 y.o. female with HTN, asthma, GERD, IBS, bipolar 1, anxiety, nausea/vomiting, h/o transaminitis, who presents with nausea.   BIBEMS from home with symptoms of vertigo/dizziness that started yesterday. Saw doctor yesterday morning for regular appointment and had some of those symptoms but got worse and worse and now has been nauseated/vomiting at home with severe symptoms, worse with head movement. +Room spinning sensation, worse with movement of head. Endorse sensation of hot/cold. Denies asymmetric weakness. Had a fall coming out of the bathroom yesterday, didn't hit her head but fell onto right shoulder/right knee, has pain in both. Able to move arm, no numbness/tingling. Had BPPV once before which felt like this, lasted one week, ended up getting admitted to hospital and it just got better. Denies fevers, chest pain, abd pain, urinary symptoms.   HPI     Home Medications Prior to Admission medications   Medication Sig Start Date End Date Taking? Authorizing Provider  ACCU-CHEK SOFTCLIX LANCETS lancets Use as instructed to test sugar once daily 08/26/18   Hoy Register, MD  acetaminophen (TYLENOL) 500 MG tablet Take 1,000 mg by mouth every 8 (eight) hours as needed for moderate pain.    [provider]  alendronate (FOSAMAX) 70 MG tablet Take 70 mg by mouth every Friday. 01/24/21   [provider]  atorvastatin (LIPITOR) 80 MG tablet Take 1 tablet by mouth once daily 06/22/21   Hoy Register, MD  baclofen (LIORESAL) 10 MG tablet TAKE 1 TABLET BY MOUTH THREE TIMES DAILY Patient taking differently: 5 mg 3 (three) times daily. 06/08/21   Hoy Register, MD  Blood Glucose Monitoring Suppl  (ACCU-CHEK AVIVA PLUS) w/Device KIT Use as instructed to test sugar once daily 07/23/18   Hoy Register, MD  diphenhydrAMINE (BENADRYL) 25 MG tablet Take 25 mg by mouth every 6 (six) hours as needed for allergies.    [provider]  docusate sodium (COLACE) 100 MG capsule Take 1 capsule (100 mg total) by mouth 2 (two) times daily. Patient not taking: Reported on 02/13/2022 02/09/22   Samson Frederic, MD  fluticasone (FLOVENT HFA) 110 MCG/ACT inhaler Inhale 2 puffs into the lungs 2 (two) times daily. 08/05/21   Marcelyn Bruins, MD  gabapentin (NEURONTIN) 600 MG tablet Take 600 mg by mouth 3 (three) times daily. 03/23/21   [provider]  glipiZIDE (GLUCOTROL) 5 MG tablet Take 0.5 tablets (2.5 mg total) by mouth 2 (two) times daily before a meal. 10/19/20   Hoy Register, MD  glucose blood (ACCU-CHEK AVIVA PLUS) test strip Use as instructed to test sugar once daily 07/23/18   Hoy Register, MD  hydrOXYzine (ATARAX/VISTARIL) 50 MG tablet Take 50 mg by mouth 3 (three) times daily as needed for anxiety.    [provider]  Lancets Misc. (ACCU-CHEK FASTCLIX LANCET) KIT Use as directed once daily 07/29/18   Hoy Register, MD  losartan (COZAAR) 50 MG tablet Take 50 mg by mouth daily. 12/05/21   [provider]  magnesium oxide (MAG-OX) 400 (241.3 Mg) MG tablet Take 400 mg by mouth daily. Patient not taking: Reported on 02/13/2022 01/08/21   [provider]  meclizine (ANTIVERT) 25 MG tablet Take 25 mg by mouth 3 (three)  times daily as needed for dizziness.    [provider]  metFORMIN (GLUCOPHAGE) 500 MG tablet Take 2 tablets (1,000 mg total) by mouth 2 (two) times daily with a meal. 10/19/20   Hoy Register, MD  methocarbamol (ROBAXIN) 500 MG tablet Take 1 tablet (500 mg total) by mouth every 6 (six) hours as needed for muscle spasms. 02/09/22   Swinteck, Arlys John, MD  ondansetron (ZOFRAN) 4 MG tablet Take 1 tablet (4 mg total) by mouth every 6 (six)  hours. Patient taking differently: Take 4 mg by mouth every 8 (eight) hours as needed for nausea or vomiting. 06/03/21   Theron Arista, PA-C  ondansetron (ZOFRAN) 4 MG tablet Take 1 tablet (4 mg total) by mouth every 6 (six) hours as needed for nausea. 02/09/22   Swinteck, Arlys John, MD  oxyCODONE (ROXICODONE) 15 MG immediate release tablet Take 1 tablet (15 mg total) by mouth every 4 (four) hours as needed for severe pain. 02/11/22   Swinteck, Arlys John, MD  pantoprazole (PROTONIX) 40 MG tablet Take 1 tablet (40 mg total) by mouth daily. 10/19/20   Hoy Register, MD  polyethylene glycol (MIRALAX / GLYCOLAX) 17 g packet Take 17 g by mouth daily as needed for mild constipation. 02/09/22   Swinteck, Arlys John, MD  polyethylene glycol (MIRALAX MIX-IN PAX) 17 g packet Take 17 g by mouth daily. 02/13/22   Sloan Leiter, DO  PROAIR HFA [phone] Base) MCG/ACT inhaler Inhale 2 puffs into the lungs every 6 (six) hours as needed for wheezing or shortness of breath. 11/24/20   [provider]  rizatriptan (MAXALT) 10 MG tablet Take 10 mg by mouth as needed for migraine. 11/18/21   [provider]  senna (SENOKOT) 8.6 MG TABS tablet Take 2 tablets (17.2 mg total) by mouth at bedtime. Patient not taking: Reported on 02/13/2022 02/09/22   Samson Frederic, MD  Spacer/Aero-Holding Deretha Emory DEVI Take 1 each by mouth as directed. Rinse mouth after use. 08/05/21   Padgett, Pilar Grammes, MD  VYVANSE 70 MG capsule Take 70 mg by mouth every morning. 12/10/21   [provider]  zolpidem (AMBIEN) 5 MG tablet TAKE 1 TABLET BY MOUTH AT BEDTIME AS NEEDED FOR SLEEP Patient taking differently: Take 5 mg by mouth at bedtime as needed for sleep. 08/18/21   Hoy Register, MD      Allergies    Morphine and related    Review of Systems   Review of Systems Review of systems Positive for falls, negative for head trauma.  A 10 point review of systems was performed and is negative unless otherwise reported in HPI.  Physical  Exam Updated Vital Signs BP (!) 152/94 (BP Location: Right Arm)   Pulse 90   Temp 98.2 F (36.8 C) (Oral)   Resp 16   Ht 5\' 4"  (1.626 m)   Wt 68.5 kg   LMP 12/11/2014 (Approximate)   SpO2 99%   BMI 25.92 kg/m  Physical Exam General: uncomfortable appearing female, lying in bed, moaning  HEENT: PERRLA, EOMI, no nystagmus, Sclera anicteric, MMM, trachea midline.  Cardiology: RRR, no murmurs/rubs/gallops. BL radial and DP pulses equal bilaterally.  Resp: Normal respiratory rate and effort. CTAB, no wheezes, rhonchi, crackles.  Abd: Soft, non-tender, non-distended. No rebound tenderness or guarding.  GU: Deferred. MSK: No peripheral edema or signs of trauma. Extremities without deformity or TTP. No cyanosis or clubbing. Skin: warm, dry. No rashes or lesions. Back: No CVA tenderness Neuro: A&Ox4, CNs II-XII grossly intact. 5/5 strength all  extremities. Sensation grossly intact. Normal speech. Psych: Anxious mood and affect.   ED Results / Procedures / Treatments   Labs (all labs ordered are listed, but only abnormal results are displayed) Labs Reviewed  COMPREHENSIVE METABOLIC PANEL - Abnormal; Notable for the following components:      Result Value   Sodium 133 (*)    Glucose, Bld 225 (*)    AST 56 (*)    ALT 63 (*)    Total Bilirubin 1.7 (*)    All other components within normal limits  MAGNESIUM - Abnormal; Notable for the following components:   Magnesium 1.6 (*)    All other components within normal limits  URINALYSIS, ROUTINE W REFLEX MICROSCOPIC - Abnormal; Notable for the following components:   APPearance HAZY (*)    Glucose, UA >=500 (*)    Ketones, ur 20 (*)    Leukocytes,Ua LARGE (*)    Non Squamous Epithelial 0-5 (*)    All other components within normal limits  CBG MONITORING, ED - Abnormal; Notable for the following components:   Glucose-Capillary 213 (*)    All other components within normal limits  CBC WITH DIFFERENTIAL/PLATELET  LIPASE, BLOOD     EKG EKG Interpretation  Date/Time:  Friday Apr 13 2023 16:58:24 EDT Ventricular Rate:  75 PR Interval:  165 QRS Duration: 103 QT Interval:  424 QTC Calculation: 474 R Axis:   44 Text Interpretation: Sinus rhythm Low voltage, precordial leads Confirmed by Vivi Barrack [phone]) on 04/13/2023 6:06:14 PM  Radiology CT Head Wo Contrast  Result Date: 04/13/2023 CLINICAL DATA:  Minor head trauma, fell EXAM: CT HEAD WITHOUT CONTRAST TECHNIQUE: Contiguous axial images were obtained from the base of the skull through the vertex without intravenous contrast. RADIATION DOSE REDUCTION: This exam was performed according to the departmental dose-optimization program which includes automated exposure control, adjustment of the mA and/or kV according to patient size and/or use of iterative reconstruction technique. COMPARISON:  None Available. FINDINGS: Brain: No acute infarct or hemorrhage. Likely chronic small-vessel ischemic changes within the periventricular white matter. No acute extra-axial fluid collections. No mass effect. Vascular: No hyperdense vessel or unexpected calcification. Skull: Normal. Negative for fracture or focal lesion. Sinuses/Orbits: Paranasal sinuses are unremarkable. Small right mastoid effusion. Other: None. IMPRESSION: 1. No acute intracranial process. 2. Small right mastoid effusion. Electronically Signed   By: Sharlet Salina M.D.   On: 04/13/2023 18:16   DG Knee Complete 4 Views Right  Result Date: 04/13/2023 CLINICAL DATA:  Trauma, fall, pain EXAM: RIGHT KNEE - COMPLETE 4+ VIEW COMPARISON:  None Available. FINDINGS: No recent fracture or dislocation is seen. There is no effusion in suprapatellar bursa. IMPRESSION: No fracture or dislocation is seen in right knee. Electronically Signed   By: Ernie Avena M.D.   On: 04/13/2023 17:58   DG Shoulder Right Portable  Result Date: 04/13/2023 CLINICAL DATA:  Pain after fall EXAM: RIGHT SHOULDER - 3 VIEW COMPARISON:  X-ray  02/09/2020 images only.  No report FINDINGS: No fracture or dislocation. Preserved glenohumeral joint space. Slight joint space loss of the AC joint. Mild hyperostosis IMPRESSION: No acute osseous abnormality. Mild degenerative changes of the [facility] joint Electronically Signed   By: Karen Kays M.D.   On: 04/13/2023 17:54    Procedures Procedures  {Document cardiac monitor, telemetry assessment procedure when appropriate:1}  Medications Ordered in ED Medications  meclizine (ANTIVERT) tablet 25 mg (25 mg Oral Not Given 04/13/23 1922)  diazepam (VALIUM) injection 2.5 mg (has no administration  in time range)  cefTRIAXone (ROCEPHIN) 1 g in sodium chloride 0.9 % 100 mL IVPB (has no administration in time range)  lactated ringers bolus 1,000 mL (1,000 mLs Intravenous New Bag/Given 04/13/23 1717)  diazepam (VALIUM) injection 2.5 mg (2.5 mg Intravenous Given 04/13/23 1728)  metoCLOPramide (REGLAN) injection 10 mg (10 mg Intravenous Given 04/13/23 1717)  meclizine (ANTIVERT) tablet 25 mg (25 mg Oral Given 04/13/23 1915)  ondansetron (ZOFRAN) injection 4 mg (4 mg Intravenous Given 04/13/23 2035)    ED Course/ Medical Decision Making/ A&P                          Medical Decision Making Amount and/or Complexity of Data Reviewed Labs: ordered. Decision-making details documented in ED Course. Radiology: ordered. Decision-making details documented in ED Course.  Risk Prescription drug management.    This patient presents to the ED for concern of vertigo, this involves an extensive number of treatment options, and is a complaint that carries with it a high risk of complications and morbidity. Patient is overall well-appearing and HDS though nauseated.  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 She hasn't had any recent illnesses or hearing loss to indicate meniere's syndrome, labyrinthitis, vestibular neuritis. She  did have head trauma with her fall and will obtain CTH. She has no nystagmus on exam and therefore cannot complete a HINTS exam. Overall lower c/f central cause such as posterior stroke or vertebrobasilar insufficiency, however if patient's symptoms cannot be controlled will consider MRI. Other causes include anemia, hyperviscosity syndrome, toxic, metabolic such as thyroid disease or hypoglycemia, or infectious cause such as UTI.    Clinical Course as of 04/13/23 2349  Fri Apr 13, 2023  1658 Glucose-Capillary(!): 213 [HN]  1805 CBC with Differential wnl [HN]  1805 DG Knee Complete 4 Views Right No fracture or dislocation is seen in right knee. [HN]  1806 DG Shoulder Right Portable No acute osseous abnormality. Mild degenerative changes of the [facility] joint   [HN]  1853 CT Head Wo Contrast 1. No acute intracranial process. 2. Small right mastoid effusion.   [HN]  1853 AST(!): 56 [HN]  1853 ALT(!): 63 [HN]  1853 Total Bilirubin(!): 1.7 Has h/o mild transaminitis [HN]  1853 Leukocytes,Ua(!): LARGE [HN]  1853 RBC / HPF: 6-10 [HN]  1853 WBC, UA: >50 [HN]  1853 Bacteria, UA: NONE SEEN [HN]  1853 UA shows >50 WBC, 6-10 RBC, and large leukocytes with no bacteria.  [HN]  2002 Patient reevaluated.  She states she feels much improved after the treatments.  She states she does still feel mildly nauseated and would like other treatment for nausea.  She has not had any dysuria or hematuria noted but she has had urinary frequency, in this case we will treat her for UTI as well. [HN]  2341 Reevaluated patient.  [HN]    Clinical Course User Index [HN] Loetta Rough, MD    Labs: I Ordered, and personally interpreted labs.  The pertinent results include:  ***  Imaging Studies ordered: I ordered imaging studies including *** I independently visualized and interpreted imaging. I agree with the radiologist interpretation  Additional history obtained from ***.  External records from outside source  obtained and reviewed including ***  Cardiac Monitoring: .The patient was maintained on a cardiac monitor.  I personally viewed and interpreted the cardiac monitored which  showed an underlying rhythm of: ***  Reevaluation: After the interventions noted above, I reevaluated the patient and found that they have :{resolved/improved/worsened:23923::"improved"}  Social Determinants of Health: .***  Disposition:  ***  Co morbidities that complicate the patient evaluation . Past Medical History:  Diagnosis Date  . ADD (attention deficit disorder)   . Anemia   . Anxiety   . Arthritis   . Asthma   . Binge eating disorder   . Bipolar 1 disorder (HCC)   . Celiac disease   . Chronic lumbar pain   . Depression   . Diabetes (HCC)   . GERD (gastroesophageal reflux disease)   . Hypercholesterolemia   . Hypertension   . Hyperthyroidism   . IBS (irritable bowel syndrome)   . Migraine   . OSA (obstructive sleep apnea)    NO CPAP CURRENTLY  . Pneumonia   . PTSD (post-traumatic stress disorder)      Medicines Meds ordered this encounter  Medications  . DISCONTD: ondansetron (ZOFRAN) injection 4 mg  . lactated ringers bolus 1,000 mL  . diazepam (VALIUM) injection 2.5 mg  . DISCONTD: meclizine (ANTIVERT) tablet 25 mg  . metoCLOPramide (REGLAN) injection 10 mg  . meclizine (ANTIVERT) tablet 25 mg  . meclizine (ANTIVERT) tablet 25 mg  . ondansetron (ZOFRAN) injection 4 mg  . DISCONTD: cefTRIAXone (ROCEPHIN) 1 g in sodium chloride 0.9 % 100 mL IVPB    Order Specific Question:   Antibiotic Indication:    Answer:   UTI  . diazepam (VALIUM) injection 2.5 mg  . cefTRIAXone (ROCEPHIN) 1 g in sodium chloride 0.9 % 100 mL IVPB    Order Specific Question:   Antibiotic Indication:    Answer:   UTI    I have reviewed the patients home medicines and have made adjustments as needed  Problem List / ED Course: Problem List Items Addressed This Visit   None        {Document critical care  time when appropriate:1} {Document review of labs and clinical decision tools ie heart score, Chads2Vasc2 etc:1}  {Document your independent review of radiology images, and any outside records:1} {Document your discussion with family members, caretakers, and with consultants:1} {Document social determinants of health affecting pt's care:1} {Document your decision making why or why not admission, treatments were needed:1}  This note was created using dictation software, which may contain spelling or grammatical errors.

## 2023-04-13 NOTE — ED Notes (Signed)
Antivert was wasted. MD made aware

## 2023-04-14 ENCOUNTER — Observation Stay (HOSPITAL_COMMUNITY): Payer: Medicare Other

## 2023-04-14 ENCOUNTER — Encounter (HOSPITAL_COMMUNITY): Payer: Self-pay | Admitting: Internal Medicine

## 2023-04-14 DIAGNOSIS — I10 Essential (primary) hypertension: Secondary | ICD-10-CM

## 2023-04-14 DIAGNOSIS — E1142 Type 2 diabetes mellitus with diabetic polyneuropathy: Secondary | ICD-10-CM

## 2023-04-14 DIAGNOSIS — N3000 Acute cystitis without hematuria: Secondary | ICD-10-CM | POA: Diagnosis present

## 2023-04-14 DIAGNOSIS — Z96641 Presence of right artificial hip joint: Secondary | ICD-10-CM | POA: Diagnosis present

## 2023-04-14 DIAGNOSIS — F319 Bipolar disorder, unspecified: Secondary | ICD-10-CM | POA: Diagnosis present

## 2023-04-14 DIAGNOSIS — Z825 Family history of asthma and other chronic lower respiratory diseases: Secondary | ICD-10-CM | POA: Diagnosis not present

## 2023-04-14 DIAGNOSIS — Z79899 Other long term (current) drug therapy: Secondary | ICD-10-CM | POA: Diagnosis not present

## 2023-04-14 DIAGNOSIS — Z823 Family history of stroke: Secondary | ICD-10-CM | POA: Diagnosis not present

## 2023-04-14 DIAGNOSIS — J4540 Moderate persistent asthma, uncomplicated: Secondary | ICD-10-CM | POA: Diagnosis present

## 2023-04-14 DIAGNOSIS — F4310 Post-traumatic stress disorder, unspecified: Secondary | ICD-10-CM | POA: Diagnosis present

## 2023-04-14 DIAGNOSIS — Z820 Family history of epilepsy and other diseases of the nervous system: Secondary | ICD-10-CM | POA: Diagnosis not present

## 2023-04-14 DIAGNOSIS — E785 Hyperlipidemia, unspecified: Secondary | ICD-10-CM | POA: Diagnosis present

## 2023-04-14 DIAGNOSIS — R7401 Elevation of levels of liver transaminase levels: Secondary | ICD-10-CM | POA: Diagnosis present

## 2023-04-14 DIAGNOSIS — M25511 Pain in right shoulder: Secondary | ICD-10-CM | POA: Diagnosis present

## 2023-04-14 DIAGNOSIS — M25561 Pain in right knee: Secondary | ICD-10-CM | POA: Diagnosis present

## 2023-04-14 DIAGNOSIS — K589 Irritable bowel syndrome without diarrhea: Secondary | ICD-10-CM | POA: Diagnosis present

## 2023-04-14 DIAGNOSIS — K219 Gastro-esophageal reflux disease without esophagitis: Secondary | ICD-10-CM | POA: Diagnosis present

## 2023-04-14 DIAGNOSIS — E7800 Pure hypercholesterolemia, unspecified: Secondary | ICD-10-CM | POA: Diagnosis present

## 2023-04-14 DIAGNOSIS — Z808 Family history of malignant neoplasm of other organs or systems: Secondary | ICD-10-CM | POA: Diagnosis not present

## 2023-04-14 DIAGNOSIS — Z7984 Long term (current) use of oral hypoglycemic drugs: Secondary | ICD-10-CM | POA: Diagnosis not present

## 2023-04-14 DIAGNOSIS — R42 Dizziness and giddiness: Secondary | ICD-10-CM | POA: Diagnosis present

## 2023-04-14 DIAGNOSIS — Z8249 Family history of ischemic heart disease and other diseases of the circulatory system: Secondary | ICD-10-CM | POA: Diagnosis not present

## 2023-04-14 DIAGNOSIS — Z803 Family history of malignant neoplasm of breast: Secondary | ICD-10-CM | POA: Diagnosis not present

## 2023-04-14 DIAGNOSIS — K900 Celiac disease: Secondary | ICD-10-CM | POA: Diagnosis present

## 2023-04-14 DIAGNOSIS — E782 Mixed hyperlipidemia: Secondary | ICD-10-CM

## 2023-04-14 LAB — CBC WITH DIFFERENTIAL/PLATELET
Abs Immature Granulocytes: 0.02 10*3/uL (ref 0.00–0.07)
Basophils Absolute: 0 10*3/uL (ref 0.0–0.1)
Basophils Relative: 1 %
Eosinophils Absolute: 0.1 10*3/uL (ref 0.0–0.5)
Eosinophils Relative: 1 %
HCT: 35.3 % — ABNORMAL LOW (ref 36.0–46.0)
Hemoglobin: 12.1 g/dL (ref 12.0–15.0)
Immature Granulocytes: 0 %
Lymphocytes Relative: 26 %
Lymphs Abs: 1.4 10*3/uL (ref 0.7–4.0)
MCH: 29.4 pg (ref 26.0–34.0)
MCHC: 34.3 g/dL (ref 30.0–36.0)
MCV: 85.9 fL (ref 80.0–100.0)
Monocytes Absolute: 0.4 10*3/uL (ref 0.1–1.0)
Monocytes Relative: 7 %
Neutro Abs: 3.6 10*3/uL (ref 1.7–7.7)
Neutrophils Relative %: 65 %
Platelets: 150 10*3/uL (ref 150–400)
RBC: 4.11 MIL/uL (ref 3.87–5.11)
RDW: 11.7 % (ref 11.5–15.5)
WBC: 5.5 10*3/uL (ref 4.0–10.5)
nRBC: 0 % (ref 0.0–0.2)

## 2023-04-14 LAB — COMPREHENSIVE METABOLIC PANEL
ALT: 48 U/L — ABNORMAL HIGH (ref 0–44)
AST: 44 U/L — ABNORMAL HIGH (ref 15–41)
Albumin: 3.8 g/dL (ref 3.5–5.0)
Alkaline Phosphatase: 79 U/L (ref 38–126)
Anion gap: 9 (ref 5–15)
BUN: 10 mg/dL (ref 8–23)
CO2: 23 mmol/L (ref 22–32)
Calcium: 9.1 mg/dL (ref 8.9–10.3)
Chloride: 98 mmol/L (ref 98–111)
Creatinine, Ser: 0.76 mg/dL (ref 0.44–1.00)
GFR, Estimated: >60 mL/min (ref >60)
Glucose, Bld: 149 mg/dL — ABNORMAL HIGH (ref 70–99)
Potassium: 3.4 mmol/L — ABNORMAL LOW (ref 3.5–5.1)
Sodium: 130 mmol/L — ABNORMAL LOW (ref 135–145)
Total Bilirubin: 1.1 mg/dL (ref 0.3–1.2)
Total Protein: 6.5 g/dL (ref 6.5–8.1)

## 2023-04-14 LAB — GLUCOSE, CAPILLARY
Glucose-Capillary: 140 mg/dL — ABNORMAL HIGH (ref 70–99)
Glucose-Capillary: 166 mg/dL — ABNORMAL HIGH (ref 70–99)
Glucose-Capillary: 143 mg/dL — ABNORMAL HIGH (ref 70–99)
Glucose-Capillary: 167 mg/dL — ABNORMAL HIGH (ref 70–99)

## 2023-04-14 LAB — HEMOGLOBIN A1C
Hgb A1c MFr Bld: 8.4 % — ABNORMAL HIGH (ref 4.8–5.6)
Mean Plasma Glucose: 194.38 mg/dL

## 2023-04-14 LAB — MAGNESIUM: Magnesium: 2 mg/dL (ref 1.7–2.4)

## 2023-04-14 MED ORDER — DIAZEPAM 5 MG/ML IJ SOLN: 5.0000 mg | Freq: Four times a day (QID) | INTRAMUSCULAR | Status: DC | PRN

## 2023-04-14 MED ORDER — MECLIZINE HCL 25 MG PO TABS
25.0000 mg | ORAL_TABLET | Freq: Three times a day (TID) | ORAL | Status: DC | PRN
  Administered 2023-04-14 (×2): 25 mg via ORAL
  Filled 2023-04-14 (×2): qty 1

## 2023-04-14 MED ORDER — ALBUTEROL SULFATE (2.5 MG/3ML) 0.083% IN NEBU: 2.5000 mg | INHALATION_SOLUTION | RESPIRATORY_TRACT | Status: DC | PRN

## 2023-04-14 MED ORDER — MAGNESIUM SULFATE 2 GM/50ML IV SOLN
2.0000 g | Freq: Once | INTRAVENOUS | Status: AC
  Administered 2023-04-14: 2 g via INTRAVENOUS
  Filled 2023-04-14: qty 50

## 2023-04-14 MED ORDER — MELATONIN 3 MG PO TABS
3.0000 mg | ORAL_TABLET | Freq: Every evening | ORAL | Status: DC | PRN
  Administered 2023-04-14 (×2): 3 mg via ORAL
  Filled 2023-04-14 (×2): qty 1

## 2023-04-14 MED ORDER — ACETAMINOPHEN 325 MG PO TABS
650.0000 mg | ORAL_TABLET | Freq: Once | ORAL | Status: AC
  Administered 2023-04-14: 650 mg via ORAL
  Filled 2023-04-14: qty 2

## 2023-04-14 MED ORDER — BUDESONIDE 0.25 MG/2ML IN SUSP
0.2500 mg | Freq: Two times a day (BID) | RESPIRATORY_TRACT | Status: DC
  Administered 2023-04-14 – 2023-04-15 (×3): 0.25 mg via RESPIRATORY_TRACT
  Filled 2023-04-14 (×3): qty 2

## 2023-04-14 MED ORDER — POTASSIUM CHLORIDE CRYS ER 20 MEQ PO TBCR
40.0000 meq | EXTENDED_RELEASE_TABLET | Freq: Once | ORAL | Status: AC
  Administered 2023-04-14: 40 meq via ORAL
  Filled 2023-04-14: qty 2

## 2023-04-14 MED ORDER — SODIUM CHLORIDE 0.9 % IV SOLN
1.0000 g | INTRAVENOUS | Status: DC
  Administered 2023-04-14: 1 g via INTRAVENOUS
  Filled 2023-04-14: qty 10

## 2023-04-14 MED ORDER — PANTOPRAZOLE SODIUM 40 MG PO TBEC
40.0000 mg | DELAYED_RELEASE_TABLET | Freq: Every day | ORAL | Status: DC
  Administered 2023-04-14 – 2023-04-15 (×2): 40 mg via ORAL
  Filled 2023-04-14 (×2): qty 1

## 2023-04-14 MED ORDER — ACETAMINOPHEN 325 MG PO TABS
650.0000 mg | ORAL_TABLET | Freq: Four times a day (QID) | ORAL | Status: DC | PRN
  Administered 2023-04-14 – 2023-04-15 (×2): 650 mg via ORAL
  Filled 2023-04-14 (×2): qty 2

## 2023-04-14 MED ORDER — ACETAMINOPHEN 650 MG RE SUPP: 650.0000 mg | Freq: Four times a day (QID) | RECTAL | Status: DC | PRN

## 2023-04-14 MED ORDER — GABAPENTIN 300 MG PO CAPS
600.0000 mg | ORAL_CAPSULE | Freq: Three times a day (TID) | ORAL | Status: DC
  Administered 2023-04-14 – 2023-04-15 (×4): 600 mg via ORAL
  Filled 2023-04-14 (×4): qty 2

## 2023-04-14 MED ORDER — LORAZEPAM 2 MG/ML IJ SOLN
0.5000 mg | Freq: Once | INTRAMUSCULAR | Status: AC
  Administered 2023-04-14: 0.5 mg via INTRAVENOUS
  Filled 2023-04-14: qty 1

## 2023-04-14 MED ORDER — ONDANSETRON HCL 4 MG/2ML IJ SOLN
4.0000 mg | Freq: Four times a day (QID) | INTRAMUSCULAR | Status: DC | PRN
  Administered 2023-04-14: 4 mg via INTRAVENOUS
  Filled 2023-04-14: qty 2

## 2023-04-14 MED ORDER — LOSARTAN POTASSIUM 50 MG PO TABS
50.0000 mg | ORAL_TABLET | Freq: Every day | ORAL | Status: DC
  Administered 2023-04-14 – 2023-04-15 (×2): 50 mg via ORAL
  Filled 2023-04-14 (×2): qty 1

## 2023-04-14 MED ORDER — INSULIN ASPART 100 UNIT/ML IJ SOLN
0.0000 [IU] | Freq: Three times a day (TID) | INTRAMUSCULAR | Status: DC
  Administered 2023-04-14: 2 [IU] via SUBCUTANEOUS
  Administered 2023-04-14 (×2): 1 [IU] via SUBCUTANEOUS
  Administered 2023-04-15: 2 [IU] via SUBCUTANEOUS

## 2023-04-14 NOTE — Progress Notes (Signed)
Brief same-day note  Patient is a 67 year old female with history of diabetes type 2, diabetic neuropathy, hypertension, hyperlipidemia, moderate persistent asthma who presented to the emergency room with complaint of vertigo which she describes a sensation of room spinning.  Vertigo was triggered with certain movements of the head.  Patient also reported 2 days history of increased urinary frequency.  Patient seen and examined the bedside today.  During my evaluation, she was overall comfortable.  She says that her vertigo has improved.  No nausea or vomiting.   Assessment and plan  Vertigo:presented to the emergency room with complaint of vertigo which she describes a sensation of room spinning.  Vertigo was triggered with certain movements of the head.  PT consulted.  Likely BPPV.  CT head did not show any acute intracranial abnormalities.  Hopefully vestibular therapy will help.  Continue meclizine as needed.  She might need to follow-up with ENT as an outpatient  Acute cystitis: 2-day history of increased urinary frequency.  UA showed significant pyuria, large leukocyte esterase.  Started on Rocephin.  Follow-up urine culture  Hypomagnesemia: Supplemented.  Being monitored  Diabetes type 2: A1c of 6.7 as per February 2023.  History of diabetic peripheral neuropathy.  Takes metformin and glipizide at home.  Monitor blood sugars.  Continue sliding scale insulin.  Check new A1c level  Hypertension: Remains mildly hypertensive.  Continue home losartan  Hyperlipidemia: Takes Lipitor at home  History of moderate persistent asthma: Currently not in exacerbation.  Continue Flovent

## 2023-04-14 NOTE — Evaluation (Addendum)
Physical Therapy Evaluation Patient Details Name: Paige Andrews MRN: [id number] DOB: 01-13-1956 Today's Date: 04/14/2023  History of Present Illness  Pt is a 67 year old female admitted on 04/13/23 for vertigo.  PMH of depression, bipolar, anemia, GERD, R shoulder surgery, ADD, R THA, migraines, PTSD  Clinical Impression  Pt admitted with above diagnosis.  Pt currently with functional limitations due to the deficits listed below (see PT Problem List). Pt will benefit from acute skilled PT to increase their independence and safety with mobility to allow discharge.   Pt seen for vestibular evaluation (below).  Nystagmus not observed with oculomotor exam or positional testing.  Pt did have increased nausea with turning head left in supine and sidelying test to right however again, no nystagmus observed.  Pt reports onset of a migraine 4 days ago which could be precipitating factor for vertigo symptoms however pt also had a fall prior to admission resulting in pain in right neck and shoulder which both indicate possible diagnosis of cervicogenic dizziness or vestibular migraine.  Pt reported constant spinning (for hours) at onset of symptoms which does not correlate with typical BPPV symptoms (typically only last seconds-minutes) as well as positional testing negative for nystagmus making BPPV diagnosis less likely.  Pt was able to ambulate approx 120 feet with RW today however slow guarded movement.  Pt reports she has a friend that can stay with her upon d/c since she lives alone.   04/14/23 1004  Vestibular Assessment  General Observation Pt does present with slight right facial droop and dysarthric speech which she reports started just prior to admission.  Pt able to stick out tongue and hold out UEs however right lagging a little however pt reports due to fall prior to admission with resulting right shoulder and neck pain.  Pt initially reported no changes around start of vertigo however then later stated  she has had a migraine for 4 days. (RN and MD notified of findings)  Symptom Behavior  Subjective history of current problem started suddenly yesterday  Type of Dizziness  Spinning;"World moves"  Frequency of Dizziness constant yesterday but has become better today  Duration of Dizziness can last for half an hour  Symptom Nature Constant  Aggravating Factors Turning head quickly  Relieving Factors Closing eyes;Head stationary  Progression of Symptoms Better  History of similar episodes has had BPPV in the past but does not feel this is BPPV, calls it "vertigo"  Oculomotor Exam  Oculomotor Alignment Normal  Spontaneous Absent  Gaze-induced  Absent  Smooth Pursuits Intact  Saccades Intact  Positional Testing  Sidelying Test Sidelying Right;Sidelying Left  Horizontal Canal Testing Horizontal Canal Right;Horizontal Canal Left  Sidelying Right  Sidelying Right Duration reports nausea and mild increase in spinning  Sidelying Right Symptoms No nystagmus  Sidelying Left  Sidelying Left Symptoms No nystagmus  Horizontal Canal Right  Horizontal Canal Right Symptoms Normal  Horizontal Canal Left  Horizontal Canal Left Duration reports nausea and mild increase in spinning  Horizontal Canal Left Symptoms Normal            Recommendations for follow up therapy are one component of a multi-disciplinary discharge planning process, led by the attending physician.  Recommendations may be updated based on patient status, additional functional criteria and insurance authorization.  Follow Up Recommendations       Assistance Recommended at Discharge PRN  Patient can return home with the following  Help with stairs or ramp for entrance;Assistance with cooking/housework  Equipment Recommendations None recommended by PT  Recommendations for Other Services       Functional Status Assessment Patient has had a recent decline in their functional status and demonstrates the ability to make  significant improvements in function in a reasonable and predictable amount of time.     Precautions / Restrictions Precautions Precautions: Fall      Mobility  Bed Mobility Overal bed mobility: Modified Independent                  Transfers Overall transfer level: Needs assistance Equipment used: Rolling walker (2 wheels) Transfers: Sit to/from Stand Sit to Stand: Min guard                Ambulation/Gait Ambulation/Gait assistance: Min guard Gait Distance (Feet): 120 Feet Assistive device: Rolling walker (2 wheels) Gait Pattern/deviations: Step-through pattern, Decreased stride length Gait velocity: decr     General Gait Details: verbal cues for use of RW, cues for fixing gaze on stationary object to avoid or improve dizziness (no increase in symptoms with ambulating)  Stairs            Wheelchair Mobility    Modified Rankin (Stroke Patients Only)       Balance Overall balance assessment: History of Falls (fall prior to admission from vertigo symptoms)                                           Pertinent Vitals/Pain Pain Assessment Pain Assessment: Faces Faces Pain Scale: Hurts little more Pain Location: right sided neck and shoulder pain since fall prior to admission Pain Descriptors / Indicators: Aching, Tender Pain Intervention(s): Repositioned, Monitored during session    Home Living Family/patient expects to be discharged to:: Private residence Living Arrangements: Alone Available Help at Discharge: Friend(s);Available 24 hours/day (pt reports her friend will stay with her upon d/c) Type of Home: House       Alternate Level Stairs-Number of Steps: flight Home Layout: Bed/bath upstairs;Two level Home Equipment: Agricultural consultant (2 wheels)      Prior Function Prior Level of Function : Independent/Modified Independent                     Hand Dominance        Extremity/Trunk Assessment   Upper  Extremity Assessment Upper Extremity Assessment: RUE deficits/detail RUE Deficits / Details: decreased active shoulder movement due to pain s/p fall prior to admission but able to functionally mobilize and hold RW    Lower Extremity Assessment Lower Extremity Assessment: Overall WFL for tasks assessed       Communication   Communication: No difficulties  Cognition Arousal/Alertness: Awake/alert Behavior During Therapy: WFL for tasks assessed/performed Overall Cognitive Status: Within Functional Limits for tasks assessed                                          General Comments      Exercises     Assessment/Plan    PT Assessment Patient needs continued PT services  PT Problem List Decreased activity tolerance;Decreased balance;Decreased mobility;Decreased knowledge of use of DME       PT Treatment Interventions DME instruction;Gait training;Therapeutic exercise;Balance training;Functional mobility training;Therapeutic activities;Patient/family education (habituation exercises, compensation strategies)    PT Goals (Current goals can  be found in the Care Plan section)  Acute Rehab PT Goals PT Goal Formulation: With patient Time For Goal Achievement: 04/28/23 Potential to Achieve Goals: Good    Frequency Min 1X/week     Co-evaluation               AM-PAC PT "6 Clicks" Mobility  Outcome Measure Help needed turning from your back to your side while in a flat bed without using bedrails?: None Help needed moving from lying on your back to sitting on the side of a flat bed without using bedrails?: A Little Help needed moving to and from a bed to a chair (including a wheelchair)?: A Little Help needed standing up from a chair using your arms (e.g., wheelchair or bedside chair)?: A Little Help needed to walk in hospital room?: A Little Help needed climbing 3-5 steps with a railing? : A Little 6 Click Score: 19    End of Session Equipment Utilized  During Treatment: Gait belt Activity Tolerance: Patient tolerated treatment well Patient left: in bed;with call bell/phone within reach;with bed alarm set Nurse Communication: Mobility status PT Visit Diagnosis: Difficulty in walking, not elsewhere classified (R26.2)    Time: 1001-1029 PT Time Calculation (min) (ACUTE ONLY): 28 min   Charges:   PT Evaluation $PT Eval Moderate Complexity: 1 Mod PT Treatments $Gait Training: 8-22 mins      Paulino Door, DPT Physical Therapist Acute Rehabilitation Services Office: [phone]  Janan Halter [place] 04/14/2023, 12:36 PM

## 2023-04-14 NOTE — H&P (Signed)
History and Physical      Paige Andrews NWG:[id number] DOB: [birth year] DOA: 04/13/2023; DOS: 04/14/2023  PCP: Felix Pacini, FNP  Patient coming from: home   I have personally briefly reviewed patient's old medical records in [facility]  Chief Complaint: vertigo  HPI: Paige Andrews is a 67 y.o. female with medical history significant for type 2 diabetes mellitus complicated by diabetic peripheral polyneuropathy, essential hypertension, hyperlipidemia, moderate persistent asthma, who is admitted to [facility] on 04/13/2023 with vertigo after presenting from home to [facility] ED complaining of vertigo.   The patient reports 2 days of recurrent vertigo, which she describes as the sensation of the room spinning.  She notes that this sensation is exacerbated with certain movements of the head, including rotational movements, and notes improvement when laying flat.  She notes that this vertigo has complicated her ambulatory abilities, and at baseline is able to ambulate without assistance.  She conveys concern regarding increased fall risk as result of this new onset vertigo.  She denies any associated acute focal weakness, numbness, paresthesias, dysphagia, acute change in vision, slurry speech, facial droop, or headache.  Not associate with any chest pain, shortness of breath, palpitations, diaphoresis, presyncope, or syncope.  not on any blood thinners, including no aspirin.  Denies any recent subjective fever, chills, rigors, or generalized myalgias.  No recent rhinitis, rhinorrhea, sore throat, cough.  She reports that her vertigo is not associate with any acute hearing loss nor any new tinnitus.  Conveys that she has very similar vertigo in the past, which ultimately resolved spontaneously.   She notes 2 days of increased urinary frequency in the absence of any associated dysuria or gross hematuria.  Denies any associated abdominal pain.       ED Course:  Vital signs in the ED  were notable for the following: Afebrile; heart rate in the 70s to 90s; systolic blood pressures in the 130s to 160s; respiratory rate 16-20, oxygen saturation 97 to 100% on room air.  Labs were notable for the following: CMP notable for the following: Sodium 133, which corrects to approximately 135 when taking into account concomitant hyperglycemia, creatinine 0.72, glucose 225.  Serum magnesium level 1.6.  CBC notable for white blood cell count 7400.  Urinalysis assisted with hazy appearing specimen and notable for greater than 50 red blood cells, large leukocyte esterase, no evidence this comes epithelial cells.  Per my interpretation, EKG in ED demonstrated the following: Sinus rhythm with heart rate 75, normal intervals, no evidence of tubular ST changes, including no evidence of ST elevation.  Imaging and additional notable ED work-up: CT head without contrast, per formal radiology read, showed no evidence of acute intracranial process, including no evidence of intracranial hemorrhage or any evidence of acute infarct.  While in the ED, the following were administered: Valium 2.5 mg IV x 2 doses, meclizine 25 mg p.o. x 1 dose, Reglan 10 mg IV x 1, Zofran 4 mg IV x 1, Rocephin 1 g IV x 1, lactated Ringer's x 1 L bolus.  Subsequently, the patient was admitted for further evaluation management of presenting vertigo, with presentation also suspicious for urinary tract infection and presenting labs notable for hypomagnesemia.     Review of Systems: As per HPI otherwise 10 point review of systems negative.   Past Medical History:  Diagnosis Date   ADD (attention deficit disorder)    Anemia    Anxiety    Arthritis    Asthma  Binge eating disorder    Bipolar 1 disorder (HCC)    Celiac disease    Chronic lumbar pain    Depression    Diabetes (HCC)    GERD (gastroesophageal reflux disease)    Hypercholesterolemia    Hypertension    Hyperthyroidism    IBS (irritable bowel syndrome)     Migraine    OSA (obstructive sleep apnea)    NO CPAP CURRENTLY   Pneumonia    PTSD (post-traumatic stress disorder)     Past Surgical History:  Procedure Laterality Date   BILATERAL CATARACT SURGERY      carpel tunnel Right    CESAREAN SECTION     CESAREAN SECTION     X 2   COLPOSCOPY     HAND SURGERY     X 2 ON RIGHT   SHOULDER SURGERY Right    TOTAL HIP ARTHROPLASTY Right 02/08/2022   Procedure: TOTAL HIP ARTHROPLASTY ANTERIOR APPROACH;  Surgeon: Samson Frederic, MD;  Location: [facility] ORS;  Service: Orthopedics;  Laterality: Right;    Social History:  reports that she has never smoked. She has never used smokeless tobacco. She reports current alcohol use. She reports that she does not use drugs.   Allergies  Allergen Reactions   Morphine And Related Anxiety    Patient states, " it makes me  feel like I am crawling out of my skin"    Family History  Problem Relation Age of Onset   Dementia Mother    Breast cancer Mother    Pancreatitis Father    Dementia Father    COPD Father    Multiple sclerosis Sister    Multiple sclerosis Brother    Allergic rhinitis Brother    Hypertension Maternal Grandmother    Stroke Maternal Grandmother    Cancer Paternal Grandfather        brain    Family history reviewed and not pertinent    Prior to Admission medications   Medication Sig Start Date End Date Taking? Authorizing Provider  ACCU-CHEK SOFTCLIX LANCETS lancets Use as instructed to test sugar once daily 08/26/18   Hoy Register, MD  acetaminophen (TYLENOL) 500 MG tablet Take 1,000 mg by mouth every 8 (eight) hours as needed for moderate pain.    [provider]  alendronate (FOSAMAX) 70 MG tablet Take 70 mg by mouth every Friday. 01/24/21   [provider]  atorvastatin (LIPITOR) 80 MG tablet Take 1 tablet by mouth once daily 06/22/21   Hoy Register, MD  baclofen (LIORESAL) 10 MG tablet TAKE 1 TABLET BY MOUTH THREE TIMES DAILY Patient taking differently: 5  mg 3 (three) times daily. 06/08/21   Hoy Register, MD  Blood Glucose Monitoring Suppl (ACCU-CHEK AVIVA PLUS) w/Device KIT Use as instructed to test sugar once daily 07/23/18   Hoy Register, MD  diphenhydrAMINE (BENADRYL) 25 MG tablet Take 25 mg by mouth every 6 (six) hours as needed for allergies.    [provider]  docusate sodium (COLACE) 100 MG capsule Take 1 capsule (100 mg total) by mouth 2 (two) times daily. Patient not taking: Reported on 02/13/2022 02/09/22   Samson Frederic, MD  fluticasone (FLOVENT HFA) 110 MCG/ACT inhaler Inhale 2 puffs into the lungs 2 (two) times daily. 08/05/21   Marcelyn Bruins, MD  gabapentin (NEURONTIN) 600 MG tablet Take 600 mg by mouth 3 (three) times daily. 03/23/21   [provider]  glipiZIDE (GLUCOTROL) 5 MG tablet Take 0.5 tablets (2.5 mg total) by mouth 2 (  two) times daily before a meal. 10/19/20   Hoy Register, MD  glucose blood (ACCU-CHEK AVIVA PLUS) test strip Use as instructed to test sugar once daily 07/23/18   Hoy Register, MD  hydrOXYzine (ATARAX/VISTARIL) 50 MG tablet Take 50 mg by mouth 3 (three) times daily as needed for anxiety.    [provider]  Lancets Misc. (ACCU-CHEK FASTCLIX LANCET) KIT Use as directed once daily 07/29/18   Hoy Register, MD  losartan (COZAAR) 50 MG tablet Take 50 mg by mouth daily. 12/05/21   [provider]  magnesium oxide (MAG-OX) 400 (241.3 Mg) MG tablet Take 400 mg by mouth daily. Patient not taking: Reported on 02/13/2022 01/08/21   [provider]  meclizine (ANTIVERT) 25 MG tablet Take 25 mg by mouth 3 (three) times daily as needed for dizziness.    [provider]  metFORMIN (GLUCOPHAGE) 500 MG tablet Take 2 tablets (1,000 mg total) by mouth 2 (two) times daily with a meal. 10/19/20   Hoy Register, MD  methocarbamol (ROBAXIN) 500 MG tablet Take 1 tablet (500 mg total) by mouth every 6 (six) hours as needed for muscle spasms. 02/09/22   Swinteck,  Arlys John, MD  ondansetron (ZOFRAN) 4 MG tablet Take 1 tablet (4 mg total) by mouth every 6 (six) hours. Patient taking differently: Take 4 mg by mouth every 8 (eight) hours as needed for nausea or vomiting. 06/03/21   Theron Arista, PA-C  ondansetron (ZOFRAN) 4 MG tablet Take 1 tablet (4 mg total) by mouth every 6 (six) hours as needed for nausea. 02/09/22   Swinteck, Arlys John, MD  oxyCODONE (ROXICODONE) 15 MG immediate release tablet Take 1 tablet (15 mg total) by mouth every 4 (four) hours as needed for severe pain. 02/11/22   Swinteck, Arlys John, MD  pantoprazole (PROTONIX) 40 MG tablet Take 1 tablet (40 mg total) by mouth daily. 10/19/20   Hoy Register, MD  polyethylene glycol (MIRALAX / GLYCOLAX) 17 g packet Take 17 g by mouth daily as needed for mild constipation. 02/09/22   Swinteck, Arlys John, MD  polyethylene glycol (MIRALAX MIX-IN PAX) 17 g packet Take 17 g by mouth daily. 02/13/22   Sloan Leiter, DO  PROAIR HFA 108 [phone] Base) MCG/ACT inhaler Inhale 2 puffs into the lungs every 6 (six) hours as needed for wheezing or shortness of breath. 11/24/20   [provider]  rizatriptan (MAXALT) 10 MG tablet Take 10 mg by mouth as needed for migraine. 11/18/21   [provider]  senna (SENOKOT) 8.6 MG TABS tablet Take 2 tablets (17.2 mg total) by mouth at bedtime. Patient not taking: Reported on 02/13/2022 02/09/22   Samson Frederic, MD  Spacer/Aero-Holding Deretha Emory DEVI Take 1 each by mouth as directed. Rinse mouth after use. 08/05/21   Padgett, Pilar Grammes, MD  VYVANSE 70 MG capsule Take 70 mg by mouth every morning. 12/10/21   [provider]  zolpidem (AMBIEN) 5 MG tablet TAKE 1 TABLET BY MOUTH AT BEDTIME AS NEEDED FOR SLEEP Patient taking differently: Take 5 mg by mouth at bedtime as needed for sleep. 08/18/21   Hoy Register, MD     Objective    Physical Exam: Vitals:   04/13/23 1556 04/13/23 1559 04/13/23 1604 04/13/23 2036  BP:  (!) 177/101  (!) 152/94  Pulse:  72  90  Resp:   20  16  Temp:  98.1 F (36.7 C)  98.2 F (36.8 C)  TempSrc:  Oral  Oral  SpO2: 98% 98%  99%  Weight:   68.5 kg   Height:   5\' 4"  (1.626 m)     General: appears to be stated age; alert, oriented Skin: warm, dry, no rash Head:  AT/[place] Mouth:  Oral mucosa membranes appear moist, normal dentition Neck: supple; trachea midline Heart:  RRR; did not appreciate any M/R/G Lungs: CTAB, did not appreciate any wheezes, rales, or rhonchi Abdomen: + BS; soft, ND, NT Vascular: 2+ pedal pulses b/l; 2+ radial pulses b/l Extremities: no peripheral edema, no muscle wasting Neuro: strength and sensation intact in upper and lower extremities b/l; no nystagmus     Labs on Admission: I have personally reviewed following labs and imaging studies  CBC: Recent Labs  Lab 04/13/23 1645  WBC 7.4  NEUTROABS 6.2  HGB 13.1  HCT 37.6  MCV 85.5  PLT 161   Basic Metabolic Panel: Recent Labs  Lab 04/13/23 1645  NA 133*  K 3.6  CL 99  CO2 22  GLUCOSE 225*  BUN 11  CREATININE 0.73  CALCIUM 9.7  MG 1.6*   GFR: Estimated Creatinine Clearance: 65.7 mL/min (by C-G formula based on SCr of 0.73 mg/dL). Liver Function Tests: Recent Labs  Lab 04/13/23 1645  AST 56*  ALT 63*  ALKPHOS 86  BILITOT 1.7*  PROT 7.2  ALBUMIN 4.2   Recent Labs  Lab 04/13/23 1645  LIPASE 28   No results for input(s): "AMMONIA" in the last 168 hours. Coagulation Profile: No results for input(s): "INR", "PROTIME" in the last 168 hours. Cardiac Enzymes: No results for input(s): "CKTOTAL", "CKMB", "CKMBINDEX", "TROPONINI" in the last 168 hours. BNP (last 3 results) No results for input(s): "PROBNP" in the last 8760 hours. HbA1C: No results for input(s): "HGBA1C" in the last 72 hours. CBG: Recent Labs  Lab 04/13/23 1652  GLUCAP 213*   Lipid Profile: No results for input(s): "CHOL", "HDL", "LDLCALC", "TRIG", "CHOLHDL", "LDLDIRECT" in the last 72 hours. Thyroid Function Tests: No results for input(s): "TSH",  "T4TOTAL", "FREET4", "T3FREE", "THYROIDAB" in the last 72 hours. Anemia Panel: No results for input(s): "VITAMINB12", "FOLATE", "FERRITIN", "TIBC", "IRON", "RETICCTPCT" in the last 72 hours. Urine analysis:    Component Value Date/Time   COLORURINE YELLOW 04/13/2023 1645   APPEARANCEUR HAZY (A) 04/13/2023 1645   LABSPEC 1.014 04/13/2023 1645   PHURINE 7.0 04/13/2023 1645   GLUCOSEU >=500 (A) 04/13/2023 1645   HGBUR NEGATIVE 04/13/2023 1645   BILIRUBINUR NEGATIVE 04/13/2023 1645   KETONESUR 20 (A) 04/13/2023 1645   PROTEINUR NEGATIVE 04/13/2023 1645   NITRITE NEGATIVE 04/13/2023 1645   LEUKOCYTESUR LARGE (A) 04/13/2023 1645    Radiological Exams on Admission: CT Head Wo Contrast  Result Date: 04/13/2023 CLINICAL DATA:  Minor head trauma, fell EXAM: CT HEAD WITHOUT CONTRAST TECHNIQUE: Contiguous axial images were obtained from the base of the skull through the vertex without intravenous contrast. RADIATION DOSE REDUCTION: This exam was performed according to the departmental dose-optimization program which includes automated exposure control, adjustment of the mA and/or kV according to patient size and/or use of iterative reconstruction technique. COMPARISON:  None Available. FINDINGS: Brain: No acute infarct or hemorrhage. Likely chronic small-vessel ischemic changes within the periventricular white matter. No acute extra-axial fluid collections. No mass effect. Vascular: No hyperdense vessel or unexpected calcification. Skull: Normal. Negative for fracture or focal lesion. Sinuses/Orbits: Paranasal sinuses are unremarkable. Small right mastoid effusion. Other: None. IMPRESSION: 1. No acute intracranial process. 2. Small right mastoid effusion. Electronically Signed   By: Sharlet Salina M.D.   On: 04/13/2023  18:16   DG Knee Complete 4 Views Right  Result Date: 04/13/2023 CLINICAL DATA:  Trauma, fall, pain EXAM: RIGHT KNEE - COMPLETE 4+ VIEW COMPARISON:  None Available. FINDINGS: No recent  fracture or dislocation is seen. There is no effusion in suprapatellar bursa. IMPRESSION: No fracture or dislocation is seen in right knee. Electronically Signed   By: Ernie Avena M.D.   On: 04/13/2023 17:58   DG Shoulder Right Portable  Result Date: 04/13/2023 CLINICAL DATA:  Pain after fall EXAM: RIGHT SHOULDER - 3 VIEW COMPARISON:  X-ray 02/09/2020 images only.  No report FINDINGS: No fracture or dislocation. Preserved glenohumeral joint space. Slight joint space loss of the AC joint. Mild hyperostosis IMPRESSION: No acute osseous abnormality. Mild degenerative changes of the [facility] joint Electronically Signed   By: Karen Kays M.D.   On: 04/13/2023 17:54      Assessment/Plan    Principal Problem:   Vertigo Active Problems:   DM2 (diabetes mellitus, type 2) (HCC)   Essential hypertension   Acute cystitis   Hypomagnesemia   Hyperlipidemia   Moderate persistent asthma     #) Vertigo: 2 day of recurrent vertigo, it appears positional in nature and suggestive of benign paroxysmal positional vertigo that has been refractory to antivert as well as multiple doses of Valium in the emergency department today, resulting in associated concerns for increased fall risk.  Of note, CT head shows no evidence of acute process, including no evidence of acute infarct.  Vertebrobasilar insufficiency appears less likely.  Denies any recent symptoms suggestive of upper respiratory infection to increase index of suspicion for labyrinthitis.  Not associate with any recent/acute hearing loss.  Additionally, no overt pharmacologic contributions.  Overall, will attempt to symptomatically manage, while pursuing consultation of vestibular physical therapy for evaluation and therapeutic intervention.    Plan: Fall precautions ordered.  Consult placed for vestibular physical therapy.  Prn Antivert.  As needed Valium for vertigo refractory to Antivert. as needed Zofran.    Monitor strict I's and O's and daily  weights.              #) Acute cystitis: In the context of 2 days of new onset increased urinary frequency, urinalysis appears suggestive of UTI in the setting of a hazy appearing specimen associated with significant pyuria, large leukocyte esterase, and no evidence of squamous epithelial cells to suggest a contaminated specimen.  In the absence of objective fever or leukocytosis, SIRS criteria not met for sepsis at this time.  Urine culture collected in the ED followed by initiation of Rocephin, which will be continued for now.  Plan: Follow result of urine culture.  Continue Rocephin.  Repeat CBC in the morning.              #) Hypomagnesemia: Present serum magnesium level 1.6.  Plan: Magnesium sulfate 2 g IV over 2 hours x 1 dose.  Monitor on symmetry.  Repeat serum magnesium level in the morning.              #) Type 2 Diabetes Mellitus: documented history of such. Home insulin regimen: None. Home oral hypoglycemic agents: Metformin and glipizide. presenting blood sugar: 225. Most recent A1c noted to be 6.7% when checked in February 2023.  Is complicated by diabetic peripheral polyneuropathy for which the patient is on gabapentin at home.   Plan: accuchecks QAC and HS with low dose SSI.  Resume home gabapentin. hold home oral hypoglycemic agents during this hospitalization.  Check hemoglobin  A1c level.              #) Essential Hypertension: documented h/o such, with outpatient antihypertensive regimen including losartan.  SBP's in the ED today: 150s to 160s mmHg.   Plan: Close monitoring of subsequent BP via routine VS. resume home losartan.              #) Hyperlipidemia: documented h/o such. On high intensity atorvastatin as outpatient.  In the setting of the mild elevation in presenting transaminases, without corresponding laboratory evidence to suggest a cholestatic pattern, will hold home atorvastatin for now, and trend liver  enzymes further with CMP in the morning.  Plan: Hold home statin and repeat CMP in the morning.              #) History of moderate persistent asthma: Documented history of such, without clinical evidence to suggest acute exacerbation at this time.  Outpatient respiratory regimen includes scheduled Flovent.  Plan: Continue scheduled Flovent.  As needed albuterol nebulizer.  Further evaluation management of presenting hypomagnesemia, as above.        DVT prophylaxis: SCD's   Code Status: Full code Family Communication: none Disposition Plan: Per Rounding Team Consults called: none;  Admission status: obs    I SPENT GREATER THAN 75  MINUTES IN CLINICAL CARE TIME/MEDICAL DECISION-MAKING IN COMPLETING THIS ADMISSION.     Chaney Born Paige Leano DO [facility] From 7PM - 7AM   04/14/2023, 12:17 AM

## 2023-04-14 NOTE — ED Notes (Signed)
ED TO INPATIENT HANDOFF REPORT  ED Nurse Name and Phone #: Linus Orn Name/Age/Gender Paige Andrews 67 y.o. female Room/Bed: WA20/WA20  Code Status   Code Status: Full Code  Home/SNF/Other Home Patient oriented to: self, place, time, and situation Is this baseline? Yes   Triage Complete: Triage complete  Chief Complaint Vertigo [R42]  Triage Note Arrived via EMS from home, hx of vertigo. N/V. Dizziness started yesterday. Had a fall yesterday at home, R shoulder pain and knee.     Allergies Allergies  Allergen Reactions   Morphine And Related Anxiety    Patient states, " it makes me  feel like I am crawling out of my skin"    Level of Care/Admitting Diagnosis ED Disposition     ED Disposition  Admit   Condition  --   Comment  Hospital Area: [facility] [[id number]]  Level of Care: Telemetry [5]  Admit to tele based on following criteria: Monitor for Ischemic changes  May place patient in observation at [facility] or [facility] if equivalent level of care is available:: No  Covid Evaluation: Asymptomatic - no recent exposure (last 10 days) testing not required  Diagnosis: Vertigo [207257]  Admitting Physician: Angie Fava [[id number]]  Attending Physician: Angie Fava [[id number]]          B Medical/Surgery History Past Medical History:  Diagnosis Date   ADD (attention deficit disorder)    Anemia    Anxiety    Arthritis    Asthma    Binge eating disorder    Bipolar 1 disorder (HCC)    Celiac disease    Chronic lumbar pain    Depression    Diabetes (HCC)    GERD (gastroesophageal reflux disease)    Hypercholesterolemia    Hypertension    Hyperthyroidism    IBS (irritable bowel syndrome)    Migraine    OSA (obstructive sleep apnea)    NO CPAP CURRENTLY   Pneumonia    PTSD (post-traumatic stress disorder)    Past Surgical History:  Procedure Laterality Date   BILATERAL CATARACT SURGERY      carpel tunnel Right     CESAREAN SECTION     CESAREAN SECTION     X 2   COLPOSCOPY     HAND SURGERY     X 2 ON RIGHT   SHOULDER SURGERY Right    TOTAL HIP ARTHROPLASTY Right 02/08/2022   Procedure: TOTAL HIP ARTHROPLASTY ANTERIOR APPROACH;  Surgeon: Samson Frederic, MD;  Location: [facility];  Service: Orthopedics;  Laterality: Right;     A IV Location/Drains/Wounds Patient Lines/Drains/Airways Status     Active Line/Drains/Airways     Name Placement date Placement time Site Days   Peripheral IV 04/13/23 20 G 1.75" Anterior;Left;Proximal Forearm 04/13/23  1714  Forearm  1   Incision (Closed) 02/08/22 Hip Right 02/08/22  1215  -- 430            Intake/Output Last 24 hours No intake or output data in the 24 hours ending 04/14/23 0043  Labs/Imaging Results for orders placed or performed during the hospital encounter of 04/13/23 (from the past 48 hour(s))  CBC with Differential     Status: None   Collection Time: 04/13/23  4:45 PM  Result Value Ref Range   WBC 7.4 4.0 - 10.5 K/uL   RBC 4.40 3.87 - 5.11 MIL/uL   Hemoglobin 13.1 12.0 - 15.0 g/dL   HCT 19.1 47.8 - 29.5 %  MCV 85.5 80.0 - 100.0 fL   MCH 29.8 26.0 - 34.0 pg   MCHC 34.8 30.0 - 36.0 g/dL   RDW 16.1 09.6 - 04.5 %   Platelets 161 150 - 400 K/uL   nRBC 0.0 0.0 - 0.2 %   Neutrophils Relative % 84 %   Neutro Abs 6.2 1.7 - 7.7 K/uL   Lymphocytes Relative 10 %   Lymphs Abs 0.8 0.7 - 4.0 K/uL   Monocytes Relative 4 %   Monocytes Absolute 0.3 0.1 - 1.0 K/uL   Eosinophils Relative 1 %   Eosinophils Absolute 0.0 0.0 - 0.5 K/uL   Basophils Relative 1 %   Basophils Absolute 0.1 0.0 - 0.1 K/uL   Immature Granulocytes 0 %   Abs Immature Granulocytes 0.03 0.00 - 0.07 K/uL    Comment: Performed at [facility], [address] [place], [place] [address]  Comprehensive metabolic panel     Status: Abnormal   Collection Time: 04/13/23  4:45 PM  Result Value Ref Range   Sodium 133 (L) 135 - 145 mmol/L   Potassium 3.6 3.5 - 5.1  mmol/L   Chloride 99 98 - 111 mmol/L   CO2 22 22 - 32 mmol/L   Glucose, Bld 225 (H) 70 - 99 mg/dL    Comment: Glucose reference range applies only to samples taken after fasting for at least 8 hours.   BUN 11 8 - 23 mg/dL   Creatinine, Ser 1.91 0.44 - 1.00 mg/dL   Calcium 9.7 8.9 - 47.8 mg/dL   Total Protein 7.2 6.5 - 8.1 g/dL   Albumin 4.2 3.5 - 5.0 g/dL   AST 56 (H) 15 - 41 U/L   ALT 63 (H) 0 - 44 U/L   Alkaline Phosphatase 86 38 - 126 U/L   Total Bilirubin 1.7 (H) 0.3 - 1.2 mg/dL   GFR, Estimated >29 >56 mL/min    Comment: (NOTE) Calculated using the CKD-EPI Creatinine Equation (2021)    Anion gap 12 5 - 15    Comment: Performed at [facility], [address], [place], [place] [address]  Magnesium     Status: Abnormal   Collection Time: 04/13/23  4:45 PM  Result Value Ref Range   Magnesium 1.6 (L) 1.7 - 2.4 mg/dL    Comment: Performed at [facility], [address], [place], [place] [address]  Urinalysis, Routine w reflex microscopic -Urine, Clean Catch     Status: Abnormal   Collection Time: 04/13/23  4:45 PM  Result Value Ref Range   Color, Urine YELLOW YELLOW   APPearance HAZY (A) CLEAR   Specific Gravity, Urine 1.014 1.005 - 1.030   pH 7.0 5.0 - 8.0   Glucose, UA >=500 (A) NEGATIVE mg/dL   Hgb urine dipstick NEGATIVE NEGATIVE   Bilirubin Urine NEGATIVE NEGATIVE   Ketones, ur 20 (A) NEGATIVE mg/dL   Protein, ur NEGATIVE NEGATIVE mg/dL   Nitrite NEGATIVE NEGATIVE   Leukocytes,Ua LARGE (A) NEGATIVE   RBC / HPF 6-10 0 - 5 RBC/hpf   WBC, UA >50 0 - 5 WBC/hpf   Bacteria, UA NONE SEEN NONE SEEN   Squamous Epithelial / HPF 0-5 0 - 5 /HPF   Non Squamous Epithelial 0-5 (A) NONE SEEN    Comment: Performed at [facility], [address], [place], [place] [address]  Lipase, blood     Status: None   Collection Time: 04/13/23  4:45 PM  Result Value Ref  Range   Lipase 28 11 - 51 U/L    Comment: Performed at [facility], [address] [place], [place] [address]  POC CBG, ED     Status: Abnormal   Collection Time: 04/13/23  4:52 PM  Result Value Ref Range   Glucose-Capillary 213 (H) 70 - 99 mg/dL    Comment: Glucose reference range applies only to samples taken after fasting for at least 8 hours.   CT Head Wo Contrast  Result Date: 04/13/2023 CLINICAL DATA:  Minor head trauma, fell EXAM: CT HEAD WITHOUT CONTRAST TECHNIQUE: Contiguous axial images were obtained from the base of the skull through the vertex without intravenous contrast. RADIATION DOSE REDUCTION: This exam was performed according to the departmental dose-optimization program which includes automated exposure control, adjustment of the mA and/or kV according to patient size and/or use of iterative reconstruction technique. COMPARISON:  None Available. FINDINGS: Brain: No acute infarct or hemorrhage. Likely chronic small-vessel ischemic changes within the periventricular white matter. No acute extra-axial fluid collections. No mass effect. Vascular: No hyperdense vessel or unexpected calcification. Skull: Normal. Negative for fracture or focal lesion. Sinuses/Orbits: Paranasal sinuses are unremarkable. Small right mastoid effusion. Other: None. IMPRESSION: 1. No acute intracranial process. 2. Small right mastoid effusion. Electronically Signed   By: Sharlet Salina M.D.   On: 04/13/2023 18:16   DG Knee Complete 4 Views Right  Result Date: 04/13/2023 CLINICAL DATA:  Trauma, fall, pain EXAM: RIGHT KNEE - COMPLETE 4+ VIEW COMPARISON:  None Available. FINDINGS: No recent fracture or dislocation is seen. There is no effusion in suprapatellar bursa. IMPRESSION: No fracture or dislocation is seen in right knee. Electronically Signed   By: Ernie Avena M.D.   On: 04/13/2023 17:58   DG Shoulder Right Portable  Result Date: 04/13/2023 CLINICAL DATA:  Pain after fall EXAM: RIGHT SHOULDER - 3 VIEW COMPARISON:  X-ray 02/09/2020 images  only.  No report FINDINGS: No fracture or dislocation. Preserved glenohumeral joint space. Slight joint space loss of the AC joint. Mild hyperostosis IMPRESSION: No acute osseous abnormality. Mild degenerative changes of the [facility] joint Electronically Signed   By: Karen Kays M.D.   On: 04/13/2023 17:54    Pending Labs Unresulted Labs (From admission, onward)     Start     Ordered   04/14/23 0500  CBC with Differential/Platelet  Tomorrow morning,   R        04/14/23 0012   04/14/23 0500  Comprehensive metabolic panel  Tomorrow morning,   R        04/14/23 0012   04/14/23 0500  Magnesium  Tomorrow morning,   R        04/14/23 0012   04/14/23 0012  Urine Culture  Add-on,   AD       Question:  Indication  Answer:  Urgency/frequency   04/14/23 0011            Vitals/Pain Today's Vitals   04/13/23 1604 04/13/23 1606 04/13/23 1607 04/13/23 2036  BP:    (!) 152/94  Pulse:    90  Resp:    16  Temp:    98.2 F (36.8 C)  TempSrc:    Oral  SpO2:    99%  Weight: 68.5 kg     Height: 5\' 4"  (1.626 m)     PainSc:  5  9      Isolation Precautions No active isolations  Medications Medications  meclizine (ANTIVERT) tablet 25 mg (25 mg  Oral Not Given 04/13/23 1922)  acetaminophen (TYLENOL) tablet 650 mg (has no administration in time range)    Or  acetaminophen (TYLENOL) suppository 650 mg (has no administration in time range)  melatonin tablet 3 mg (has no administration in time range)  ondansetron (ZOFRAN) injection 4 mg (has no administration in time range)  meclizine (ANTIVERT) tablet 25 mg (has no administration in time range)  diazepam (VALIUM) injection 5 mg (has no administration in time range)  cefTRIAXone (ROCEPHIN) 1 g in sodium chloride 0.9 % 100 mL IVPB (has no administration in time range)  magnesium sulfate IVPB 2 g 50 mL (2 g Intravenous New Bag/Given 04/14/23 0041)  lactated ringers bolus 1,000 mL (0 mLs Intravenous Stopped 04/14/23 0016)  diazepam (VALIUM) injection 2.5 mg  (2.5 mg Intravenous Given 04/13/23 1728)  metoCLOPramide (REGLAN) injection 10 mg (10 mg Intravenous Given 04/13/23 1717)  meclizine (ANTIVERT) tablet 25 mg (25 mg Oral Given 04/13/23 1915)  ondansetron (ZOFRAN) injection 4 mg (4 mg Intravenous Given 04/13/23 2035)  diazepam (VALIUM) injection 2.5 mg (2.5 mg Intravenous Given 04/13/23 2356)  cefTRIAXone (ROCEPHIN) 1 g in sodium chloride 0.9 % 100 mL IVPB (0 g Intravenous Stopped 04/14/23 0036)    Mobility walks with person assist     Focused Assessments Cardiac Assessment Handoff:    Lab Results  Component Value Date   TROPONINI <0.03 01/22/2017   No results found for: "DDIMER" Does the Patient currently have chest pain? No    R Recommendations: See Admitting Provider Note  Report given to:   Additional Notes: AAOx4, pt unable to ambulate do to dizziness. Pt normal ambulates.

## 2023-04-15 DIAGNOSIS — R42 Dizziness and giddiness: Secondary | ICD-10-CM | POA: Diagnosis not present

## 2023-04-15 LAB — BASIC METABOLIC PANEL
Anion gap: 9 (ref 5–15)
BUN: 12 mg/dL (ref 8–23)
CO2: 24 mmol/L (ref 22–32)
Calcium: 9.4 mg/dL (ref 8.9–10.3)
Chloride: 104 mmol/L (ref 98–111)
Creatinine, Ser: 0.84 mg/dL (ref 0.44–1.00)
GFR, Estimated: >60 mL/min (ref >60)
Glucose, Bld: 138 mg/dL — ABNORMAL HIGH (ref 70–99)
Potassium: 4.1 mmol/L (ref 3.5–5.1)
Sodium: 137 mmol/L (ref 135–145)

## 2023-04-15 LAB — GLUCOSE, CAPILLARY: Glucose-Capillary: 156 mg/dL — ABNORMAL HIGH (ref 70–99)

## 2023-04-15 LAB — URINE CULTURE

## 2023-04-15 MED ORDER — MECLIZINE HCL 25 MG PO TABS: 25.0000 mg | ORAL_TABLET | Freq: Three times a day (TID) | ORAL | 0 refills | Status: AC | PRN

## 2023-04-15 NOTE — Progress Notes (Signed)
Physical Therapy Treatment Patient Details Name: Paige Andrews MRN: [id number] DOB: Apr 15, 1956 Today's Date: 04/15/2023   History of Present Illness Pt is a 67 year old female admitted on 04/13/23 for vertigo.  PMH of depression, bipolar, anemia, GERD, R shoulder surgery, ADD, R THA, migraines, PTSD    PT Comments    Pt reports feeling much better today. Pt has had no dizziness with morning and was not dizzy with mobility.  Pt able to ambulate without assistive device in hallway.  Pt anticipates d/c home today.    Recommendations for follow up therapy are one component of a multi-disciplinary discharge planning process, led by the attending physician.  Recommendations may be updated based on patient status, additional functional criteria and insurance authorization.  Follow Up Recommendations       Assistance Recommended at Discharge PRN  Patient can return home with the following Help with stairs or ramp for entrance;Assistance with cooking/housework   Equipment Recommendations  None recommended by PT    Recommendations for Other Services       Precautions / Restrictions Precautions Precautions: Fall     Mobility  Bed Mobility Overal bed mobility: Modified Independent                  Transfers Overall transfer level: Modified independent                      Ambulation/Gait Ambulation/Gait assistance: Supervision, Modified independent (Device/Increase time) Gait Distance (Feet): 180 Feet Assistive device: None Gait Pattern/deviations: Step-through pattern, Decreased stride length       General Gait Details: pt declined need for RW today, no dizziness, mildly unsteady with challenges to balance (start/stop, turn in circle, head turns) however no physical assist required (pt unable to tightrope walk however reports she was not able to do this even before admission)   Stairs             Wheelchair Mobility    Modified Rankin (Stroke Patients  Only)       Balance                                            Cognition Arousal/Alertness: Awake/alert Behavior During Therapy: WFL for tasks assessed/performed Overall Cognitive Status: Within Functional Limits for tasks assessed                                          Exercises      General Comments        Pertinent Vitals/Pain Pain Assessment Pain Assessment: No/denies pain    Home Living                          Prior Function            PT Goals (current goals can now be found in the care plan section) Progress towards PT goals: Progressing toward goals    Frequency    Min 1X/week      PT Plan Current plan remains appropriate    Co-evaluation              AM-PAC PT "6 Clicks" Mobility   Outcome Measure  Help needed turning from your back to your side while in a  flat bed without using bedrails?: None Help needed moving from lying on your back to sitting on the side of a flat bed without using bedrails?: None Help needed moving to and from a bed to a chair (including a wheelchair)?: None Help needed standing up from a chair using your arms (e.g., wheelchair or bedside chair)?: None Help needed to walk in hospital room?: A Little Help needed climbing 3-5 steps with a railing? : A Little 6 Click Score: 22    End of Session Equipment Utilized During Treatment: Gait belt Activity Tolerance: Patient tolerated treatment well Patient left: in bed;with call bell/phone within reach;with bed alarm set Nurse Communication: Mobility status PT Visit Diagnosis: Difficulty in walking, not elsewhere classified (R26.2)     Time: 9528-4132 PT Time Calculation (min) (ACUTE ONLY): 10 min  Charges:  $Gait Training: 8-22 mins                     Paulino Door, DPT Physical Therapist Acute Rehabilitation Services Office: [phone]    Janan Halter [place] 04/15/2023, 4:37 PM

## 2023-04-15 NOTE — Discharge Summary (Signed)
Physician Discharge Summary  Paige Andrews WGN:[id number] DOB: 1956/10/17 DOA: 04/13/2023  PCP: Felix Pacini, FNP  Admit date: 04/13/2023 Discharge date: 04/15/2023  Admitted From: Home Disposition:  Home  Discharge Condition:Stable CODE STATUS:FULL Diet recommendation: Heart Healthy   Brief/Interim Summary: Patient is a 67 year old female with history of diabetes type 2, diabetic neuropathy, hypertension, hyperlipidemia, moderate persistent asthma who presented to the emergency room with complaint of vertigo which she describes a sensation of room spinning.  Vertigo was triggered with certain movements of the head.  Patient also reported 2 days history of increased urinary frequency.  Hospital course remained stable.  She was seen by PT and recommended outpatient follow-up.  Her dizziness completely resolved today.  MRI of the brain was also done which did not show any acute findings.  Medically stable for discharge home today  Following problems were addressed during the hospitalization:  Vertigo:presented to the emergency room with complaint of vertigo which she describes a sensation of room spinning.  Vertigo was triggered with certain movements of the head.  PT consulted.  CT head did not show any acute intracranial abnormalities Continue meclizine as needed.  She might need to follow-up with ENT as an outpatient. MRI of the brain did not show any acute findings.  It   Acute cystitis: 2-day history of increased urinary frequency.  UA showed significant pyuria, large leukocyte esterase.  Started on Rocephin.  Cultures are multiple species.  She does not have any urinary symptoms today.  Antibiotics will not be continued on discharge   Hypomagnesemia: Supplemented corrected   Diabetes type 2: A1c of 6.7 as per February 2023.  History of diabetic peripheral neuropathy.  Takes metformin and glipizide at home.  Monitor blood sugars at home, hemoglobin A1c of  8.4  Hypertension:    Continue home  regimen   Hyperlipidemia: Takes Lipitor at home   History of moderate persistent asthma: Currently not in exacerbation.  Continue Flovent  Discharge Diagnoses:  Principal Problem:   Vertigo Active Problems:   DM2 (diabetes mellitus, type 2) (HCC)   Essential hypertension   Acute cystitis   Hypomagnesemia   Hyperlipidemia   Moderate persistent asthma    Discharge Instructions  Discharge Instructions     Diet - low sodium heart healthy   Complete by: As directed    Discharge instructions   Complete by: As directed    1)Please take prescribed medication as instructed 2)Follow up with outpatient physical therapy.  Follow-up with ENT as an outpatient if vertigo persists 4)Your hemoglobin A1c is 8.4.  Please follow-up with your primary care physician closely for the management of your  diabetes and monitor your blood sugars at home.   Increase activity slowly   Complete by: As directed       Allergies as of 04/15/2023       Reactions   Morphine And Related Anxiety   Patient states, " it makes me  feel like I am crawling out of my skin"        Medication List     TAKE these medications    Accu-Chek Aviva Plus w/Device Kit Use as instructed to test sugar once daily   Accu-Chek FastClix Lancet Kit Use as directed once daily   Accu-Chek Softclix Lancets lancets Use as instructed to test sugar once daily   acetaminophen 500 MG tablet Commonly known as: TYLENOL Take 1,000 mg by mouth every 8 (eight) hours as needed for moderate pain.   alendronate 70 MG tablet  Commonly known as: FOSAMAX Take 70 mg by mouth every Friday.   atorvastatin 80 MG tablet Commonly known as: LIPITOR Take 1 tablet by mouth once daily   Baclofen 5 MG Tabs Take 1 tablet by mouth 3 (three) times daily as needed (Muscle spasms).   diphenhydrAMINE 25 MG tablet Commonly known as: BENADRYL Take 25 mg by mouth every 6 (six) hours as needed for allergies.   docusate sodium  100 MG capsule Commonly known as: COLACE Take 1 capsule (100 mg total) by mouth 2 (two) times daily.   fluticasone 110 MCG/ACT inhaler Commonly known as: Flovent HFA Inhale 2 puffs into the lungs 2 (two) times daily.   gabapentin 600 MG tablet Commonly known as: NEURONTIN Take 600 mg by mouth 3 (three) times daily.   glipiZIDE 5 MG tablet Commonly known as: GLUCOTROL Take 0.5 tablets (2.5 mg total) by mouth 2 (two) times daily before a meal.   glucose blood test strip Commonly known as: Accu-Chek Aviva Plus Use as instructed to test sugar once daily   hydrOXYzine 50 MG tablet Commonly known as: ATARAX Take 50 mg by mouth 3 (three) times daily as needed for anxiety.   losartan 50 MG tablet Commonly known as: COZAAR Take 50 mg by mouth daily.   magnesium oxide 400 (241.3 Mg) MG tablet Commonly known as: MAG-OX Take 400 mg by mouth daily.   meclizine 25 MG tablet Commonly known as: ANTIVERT Take 1 tablet (25 mg total) by mouth 3 (three) times daily as needed for dizziness.   metFORMIN 500 MG tablet Commonly known as: GLUCOPHAGE Take 2 tablets (1,000 mg total) by mouth 2 (two) times daily with a meal.   methocarbamol 500 MG tablet Commonly known as: ROBAXIN Take 1 tablet (500 mg total) by mouth every 6 (six) hours as needed for muscle spasms.   metoprolol succinate 50 MG 24 hr tablet Commonly known as: TOPROL-XL Take 1 tablet by mouth daily.   ondansetron 4 MG tablet Commonly known as: ZOFRAN Take 1 tablet (4 mg total) by mouth every 6 (six) hours. What changed:  when to take this reasons to take this   ondansetron 4 MG tablet Commonly known as: ZOFRAN Take 1 tablet (4 mg total) by mouth every 6 (six) hours as needed for nausea. What changed: Another medication with the same name was changed. Make sure you understand how and when to take each.   oxyCODONE 15 MG immediate release tablet Commonly known as: ROXICODONE Take 1 tablet (15 mg total) by mouth every 4  (four) hours as needed for severe pain.   oxyCODONE-acetaminophen 5-325 MG tablet Commonly known as: PERCOCET/ROXICET Take 1 tablet by mouth every 6 (six) hours as needed for moderate pain or severe pain.   Ozempic (2 MG/DOSE) 8 MG/3ML Sopn Generic drug: Semaglutide (2 MG/DOSE) Inject 2 mg into the skin once a week.   pantoprazole 40 MG tablet Commonly known as: PROTONIX Take 1 tablet (40 mg total) by mouth daily.   polyethylene glycol 17 g packet Commonly known as: MIRALAX / GLYCOLAX Take 17 g by mouth daily as needed for mild constipation.   polyethylene glycol 17 g packet Commonly known as: MiraLax Mix-In Pax Take 17 g by mouth daily.   ProAir HFA 108 (90 Base) MCG/ACT inhaler Generic drug: albuterol Inhale 2 puffs into the lungs every 6 (six) hours as needed for wheezing or shortness of breath.   rizatriptan 10 MG tablet Commonly known as: MAXALT Take 10 mg by mouth as  needed for migraine.   senna 8.6 MG Tabs tablet Commonly known as: SENOKOT Take 2 tablets (17.2 mg total) by mouth at bedtime.   Spacer/Aero-Holding [employer] Take 1 each by mouth as directed. Rinse mouth after use.   Vitamin D (Ergocalciferol) 1.25 MG (50000 UNIT) Caps capsule Commonly known as: DRISDOL Take 50,000 Units by mouth once a week.   Vyvanse 70 MG capsule Generic drug: lisdexamfetamine Take 70 mg by mouth every morning.   zolpidem 5 MG tablet Commonly known as: AMBIEN TAKE 1 TABLET BY MOUTH AT BEDTIME AS NEEDED FOR SLEEP What changed:  reasons to take this additional instructions        Follow-up Information     Levan Hurst Anastasia Fiedler, FNP. Schedule an appointment as soon as possible for a visit in 1 week(s).   Specialty: Endocrinology Contact information: [address] [place] [address] [phone]                Allergies  Allergen Reactions   Morphine And Related Anxiety    Patient states, " it makes me  feel like I am crawling out of my skin"     Consultations: None   Procedures/Studies: MR BRAIN WO CONTRAST  Result Date: 04/14/2023 CLINICAL DATA:  Neuro deficit, acute, stroke suspected.  Vertigo. EXAM: MRI HEAD WITHOUT CONTRAST TECHNIQUE: Multiplanar, multiecho pulse sequences of the brain and surrounding structures were obtained without intravenous contrast. COMPARISON:  Head CT 04/13/2023. FINDINGS: Brain: No acute infarct or hemorrhage. Confluent T2 hyperintensity in the cerebral white matter, most consistent with chronic small-vessel disease. No mass or midline shift. Mild prominence of the ventricles and sulci, within expected range for age. No extra-axial collection. No foci of abnormal susceptibility. Vascular: Normal flow voids. Skull and upper cervical spine: Normal marrow signal. Sinuses/Orbits: Trace fluid in the bilateral mastoid air cells. Paranasal sinuses are well aerated. Orbits are unremarkable. Other: None. IMPRESSION: No acute intracranial process. Electronically Signed   By: Orvan Falconer M.D.   On: 04/14/2023 15:07   CT Head Wo Contrast  Result Date: 04/13/2023 CLINICAL DATA:  Minor head trauma, fell EXAM: CT HEAD WITHOUT CONTRAST TECHNIQUE: Contiguous axial images were obtained from the base of the skull through the vertex without intravenous contrast. RADIATION DOSE REDUCTION: This exam was performed according to the departmental dose-optimization program which includes automated exposure control, adjustment of the mA and/or kV according to patient size and/or use of iterative reconstruction technique. COMPARISON:  None Available. FINDINGS: Brain: No acute infarct or hemorrhage. Likely chronic small-vessel ischemic changes within the periventricular white matter. No acute extra-axial fluid collections. No mass effect. Vascular: No hyperdense vessel or unexpected calcification. Skull: Normal. Negative for fracture or focal lesion. Sinuses/Orbits: Paranasal sinuses are unremarkable. Small right mastoid effusion. Other:  None. IMPRESSION: 1. No acute intracranial process. 2. Small right mastoid effusion. Electronically Signed   By: Sharlet Salina M.D.   On: 04/13/2023 18:16   DG Knee Complete 4 Views Right  Result Date: 04/13/2023 CLINICAL DATA:  Trauma, fall, pain EXAM: RIGHT KNEE - COMPLETE 4+ VIEW COMPARISON:  None Available. FINDINGS: No recent fracture or dislocation is seen. There is no effusion in suprapatellar bursa. IMPRESSION: No fracture or dislocation is seen in right knee. Electronically Signed   By: Ernie Avena M.D.   On: 04/13/2023 17:58   DG Shoulder Right Portable  Result Date: 04/13/2023 CLINICAL DATA:  Pain after fall EXAM: RIGHT SHOULDER - 3 VIEW COMPARISON:  X-ray 02/09/2020 images only.  No report FINDINGS:  No fracture or dislocation. Preserved glenohumeral joint space. Slight joint space loss of the AC joint. Mild hyperostosis IMPRESSION: No acute osseous abnormality. Mild degenerative changes of the [facility] joint Electronically Signed   By: Karen Kays M.D.   On: 04/13/2023 17:54      Subjective: Patient seen and examined at bedside today.  Hemodynamically stable for discharge.  Discharge Exam: Vitals:   04/15/23 0515 04/15/23 0639  BP: 130/88   Pulse: 91   Resp: 17   Temp: 98.1 F (36.7 C)   SpO2: 97% 98%   Vitals:   04/14/23 2119 04/15/23 0500 04/15/23 0515 04/15/23 0639  BP:   130/88   Pulse:   91   Resp:   17   Temp:   98.1 F (36.7 C)   TempSrc:   Oral   SpO2: 96%  97% 98%  Weight:  67.8 kg    Height:        General: Pt is alert, awake, not in acute distress Cardiovascular: RRR, S1/S2 +, no rubs, no gallops Respiratory: CTA bilaterally, no wheezing, no rhonchi Abdominal: Soft, NT, ND, bowel sounds + Extremities: no edema, no cyanosis    The results of significant diagnostics from this hospitalization (including imaging, microbiology, ancillary and laboratory) are listed below for reference.     Microbiology: Recent Results (from the past 240 hour(s))   Urine Culture     Status: Abnormal   Collection Time: 04/13/23  4:45 PM   Specimen: Urine, Clean Catch  Result Value Ref Range Status   Specimen Description   Final    URINE, CLEAN CATCH Performed at [facility], [address], [place], [place] [address]    Special Requests   Final    NONE Performed at [facility], [address], [place], [place] [address]    Culture MULTIPLE SPECIES PRESENT, SUGGEST RECOLLECTION (A)  Final   Report Status 04/15/2023 FINAL  Final     Labs: BNP (last 3 results) No results for input(s): "BNP" in the last 8760 hours. Basic Metabolic Panel: Recent Labs  Lab 04/13/23 1645 04/14/23 1220 04/15/23 0636  NA 133* 130* 137  K 3.6 3.4* 4.1  CL 99 98 104  CO2 22 23 24   GLUCOSE 225* 149* 138*  BUN 11 10 12   CREATININE 0.73 0.76 0.84  CALCIUM 9.7 9.1 9.4  MG 1.6* 2.0  --    Liver Function Tests: Recent Labs  Lab 04/13/23 1645 04/14/23 1220  AST 56* 44*  ALT 63* 48*  ALKPHOS 86 79  BILITOT 1.7* 1.1  PROT 7.2 6.5  ALBUMIN 4.2 3.8   Recent Labs  Lab 04/13/23 1645  LIPASE 28   No results for input(s): "AMMONIA" in the last 168 hours. CBC: Recent Labs  Lab 04/13/23 1645 04/14/23 1220  WBC 7.4 5.5  NEUTROABS 6.2 3.6  HGB 13.1 12.1  HCT 37.6 35.3*  MCV 85.5 85.9  PLT 161 150   Cardiac Enzymes: No results for input(s): "CKTOTAL", "CKMB", "CKMBINDEX", "TROPONINI" in the last 168 hours. BNP: Invalid input(s): "POCBNP" CBG: Recent Labs  Lab 04/14/23 0731 04/14/23 1133 04/14/23 1632 04/14/23 2142 04/15/23 0818  GLUCAP 140* 166* 143* 167* 156*   D-Dimer No results for input(s): "DDIMER" in the last 72 hours. Hgb A1c Recent Labs    04/14/23 1220  HGBA1C 8.4*   Lipid Profile No results for input(s): "CHOL", "HDL", "LDLCALC", "TRIG", "CHOLHDL", "LDLDIRECT" in the last 72 hours. Thyroid function studies No results for input(s): "  TSH", "T4TOTAL", "T3FREE", "THYROIDAB" in the last 72  hours.  Invalid input(s): "FREET3" Anemia work up No results for input(s): "VITAMINB12", "FOLATE", "FERRITIN", "TIBC", "IRON", "RETICCTPCT" in the last 72 hours. Urinalysis    Component Value Date/Time   COLORURINE YELLOW 04/13/2023 1645   APPEARANCEUR HAZY (A) 04/13/2023 1645   LABSPEC 1.014 04/13/2023 1645   PHURINE 7.0 04/13/2023 1645   GLUCOSEU >=500 (A) 04/13/2023 1645   HGBUR NEGATIVE 04/13/2023 1645   BILIRUBINUR NEGATIVE 04/13/2023 1645   KETONESUR 20 (A) 04/13/2023 1645   PROTEINUR NEGATIVE 04/13/2023 1645   NITRITE NEGATIVE 04/13/2023 1645   LEUKOCYTESUR LARGE (A) 04/13/2023 1645   Sepsis Labs Recent Labs  Lab 04/13/23 1645 04/14/23 1220  WBC 7.4 5.5   Microbiology Recent Results (from the past 240 hour(s))  Urine Culture     Status: Abnormal   Collection Time: 04/13/23  4:45 PM   Specimen: Urine, Clean Catch  Result Value Ref Range Status   Specimen Description   Final    URINE, CLEAN CATCH Performed at [facility], [address], [place], [place] [address]    Special Requests   Final    NONE Performed at [facility], [address] [place], [place] [address]    Culture MULTIPLE SPECIES PRESENT, SUGGEST RECOLLECTION (A)  Final   Report Status 04/15/2023 FINAL  Final    Please note: You were cared for by a hospitalist during your hospital stay. Once you are discharged, your primary care physician will handle any further medical issues. Please note that NO REFILLS for any discharge medications will be authorized once you are discharged, as it is imperative that you return to your primary care physician (or establish a relationship with a primary care physician if you do not have one) for your post hospital discharge needs so that they can reassess your need for medications and monitor your lab values.    Time coordinating discharge: 40 minutes  SIGNED:   Burnadette Pop, MD  [facility] 04/15/2023, 9:59  AM Pager [phone]  If 7PM-7AM, please contact night-coverage [email or web address] Password [email or web address]

## 2023-04-15 NOTE — TOC Transition Note (Addendum)
Transition of Care [facility]) - CM/SW Discharge Note   Patient Details  Name: Paige Andrews MRN: [id number] Date of Birth: 1956/02/19  Transition of Care [facility]) CM/SW Contact:  Lanier Clam, RN Phone Number: 04/15/2023, 11:14 AM   Clinical Narrative: Provided w/otpt PT in f/u section of d/c. Provided w/bus pass. No further CM needs. -12p-after review with nsg patient not able to take bus d/t capability of managing the bus route. Taxi voucher provided-[employer] will contact nsg once in main entrance. No further CM needs.      Final next level of care: OP Rehab Barriers to Discharge: No Barriers Identified   Patient Goals and CMS Choice      Discharge Placement                         Discharge Plan and Services Additional resources added to the After Visit Summary for                                       Social Determinants of Health (SDOH) Interventions SDOH Screenings   Food Insecurity: No Food Insecurity (04/14/2023)  Housing: Low Risk  (04/14/2023)  Transportation Needs: No Transportation Needs (04/14/2023)  Utilities: Not At Risk (04/14/2023)  Depression (PHQ2-9): Medium Risk (10/19/2020)  Tobacco Use: Low Risk  (04/14/2023)     Readmission Risk Interventions     No data to display

## 2023-04-15 NOTE — Plan of Care (Signed)

## 2023-05-03 ENCOUNTER — Ambulatory Visit (INDEPENDENT_AMBULATORY_CARE_PROVIDER_SITE_OTHER): Payer: Medicare Other | Admitting: Podiatry

## 2023-05-03 VITALS — BP 147/91

## 2023-05-03 DIAGNOSIS — M2141 Flat foot [pes planus] (acquired), right foot: Secondary | ICD-10-CM | POA: Diagnosis not present

## 2023-05-03 DIAGNOSIS — E1142 Type 2 diabetes mellitus with diabetic polyneuropathy: Secondary | ICD-10-CM

## 2023-05-03 DIAGNOSIS — M2142 Flat foot [pes planus] (acquired), left foot: Secondary | ICD-10-CM

## 2023-05-03 DIAGNOSIS — B353 Tinea pedis: Secondary | ICD-10-CM | POA: Diagnosis not present

## 2023-05-03 DIAGNOSIS — M2170 Unequal limb length (acquired), unspecified site: Secondary | ICD-10-CM | POA: Diagnosis not present

## 2023-05-03 MED ORDER — CLOTRIMAZOLE-BETAMETHASONE 1-0.05 % EX CREA: 1.0000 | TOPICAL_CREAM | Freq: Every day | CUTANEOUS | 0 refills | Status: DC

## 2023-05-03 NOTE — Progress Notes (Signed)
  Subjective:  Patient ID: Paige Andrews, female    DOB: 1956-11-23,  MRN: [id number]  Chief Complaint  Patient presents with   itchy feet    RM 24 Pt in in office today with complaints of dry itchy feet and cracked heels.    Toe Pain    Bilateral toe bruising. Pt doe not recall when her toe started or injuring her feet. Was seen by PCP.     67 y.o. female presents with the above complaint. History confirmed with patient.  Bruising is resolved and is not painful.  She also notes dry cracking skin that does not heal.  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  Left lower extremity 1 cm shorter than right.  She does have dry scaling cracking skin on the posterior heels bilateral, skin is well perfused nails are in good condition Assessment:   1. Tinea pedis of both feet   2. Type 2 diabetes mellitus with diabetic polyneuropathy, without long-term current use of insulin (HCC)   3. Pes planus of both feet   4. Acquired unequal limb length      Plan:  Patient was evaluated and treated and all questions answered.  Patient educated on diabetes. Discussed proper diabetic foot care and discussed risks and complications of disease. Educated patient in depth on reasons to return to the office immediately should he/she discover anything concerning or new on the feet. All questions answered. Discussed proper shoes as well.   We discussed that the fissuring and cracking on the heels likely is tinea pedis.  I discussed etiology treatment options of this.  I recommended Lotrisone cream.  Rx sent to pharmacy.  We also discussed appropriate shoe gear which likely is causing the issue with the bruising that she was having on her toes.  Additionally she has an unequal limb  length discrepancy at this point as well.  The left lower extremity is 1 cm shorter than the right.  I recommend we have her fitted for extra-depth diabetic shoes with a multidensity insole and a 1 cm lift on the left side.  Return if symptoms worsen or fail to improve.

## 2023-05-10 ENCOUNTER — Ambulatory Visit (INDEPENDENT_AMBULATORY_CARE_PROVIDER_SITE_OTHER): Payer: Medicare Other | Admitting: Mental Health

## 2023-05-10 ENCOUNTER — Ambulatory Visit: Payer: Medicare Other | Admitting: Physical Therapy

## 2023-05-10 DIAGNOSIS — F319 Bipolar disorder, unspecified: Secondary | ICD-10-CM | POA: Diagnosis not present

## 2023-05-10 DIAGNOSIS — F411 Generalized anxiety disorder: Secondary | ICD-10-CM

## 2023-05-10 DIAGNOSIS — F4310 Post-traumatic stress disorder, unspecified: Secondary | ICD-10-CM | POA: Insufficient documentation
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
## 2023-05-23 ENCOUNTER — Ambulatory Visit (INDEPENDENT_AMBULATORY_CARE_PROVIDER_SITE_OTHER): Payer: Medicare Other | Admitting: Podiatry

## 2023-05-23 DIAGNOSIS — M2142 Flat foot [pes planus] (acquired), left foot: Secondary | ICD-10-CM

## 2023-05-23 DIAGNOSIS — M2141 Flat foot [pes planus] (acquired), right foot: Secondary | ICD-10-CM

## 2023-05-23 DIAGNOSIS — E1142 Type 2 diabetes mellitus with diabetic polyneuropathy: Secondary | ICD-10-CM

## 2023-05-23 NOTE — Progress Notes (Signed)
Patient presents today to measured for diabetic shoes and insoles.  Patient was casted for 1 pair of diabetic shoes and 3 pairs of foam casted diabetic insoles.  Ht 5'4 Wt 150 Shoe size  7W Shoe type  [id number]  Treating physician : laura [place]  Re-appointment for regularly scheduled diabetic foot care visits or if they should experience any trouble with the shoes or insoles.

## 2023-05-30 ENCOUNTER — Other Ambulatory Visit: Payer: Self-pay | Admitting: Podiatry

## 2023-06-20 ENCOUNTER — Ambulatory Visit (HOSPITAL_COMMUNITY): Payer: Medicare Other | Admitting: Mental Health

## 2023-08-08 ENCOUNTER — Ambulatory Visit (HOSPITAL_COMMUNITY): Payer: Medicare Other | Admitting: Mental Health

## 2023-10-17 ENCOUNTER — Ambulatory Visit (INDEPENDENT_AMBULATORY_CARE_PROVIDER_SITE_OTHER): Payer: Medicare Other

## 2023-10-17 DIAGNOSIS — E1142 Type 2 diabetes mellitus with diabetic polyneuropathy: Secondary | ICD-10-CM | POA: Diagnosis not present

## 2023-10-17 DIAGNOSIS — M2170 Unequal limb length (acquired), unspecified site: Secondary | ICD-10-CM

## 2023-10-17 DIAGNOSIS — M2141 Flat foot [pes planus] (acquired), right foot: Secondary | ICD-10-CM

## 2023-10-17 DIAGNOSIS — M2142 Flat foot [pes planus] (acquired), left foot: Secondary | ICD-10-CM | POA: Diagnosis not present

## 2023-10-17 NOTE — Progress Notes (Signed)

## 2023-10-22 ENCOUNTER — Encounter: Payer: Self-pay | Admitting: Podiatry

## 2023-10-22 ENCOUNTER — Ambulatory Visit (INDEPENDENT_AMBULATORY_CARE_PROVIDER_SITE_OTHER): Payer: Medicare Other | Admitting: Podiatry

## 2023-10-22 ENCOUNTER — Ambulatory Visit (INDEPENDENT_AMBULATORY_CARE_PROVIDER_SITE_OTHER): Payer: Medicare Other

## 2023-10-22 DIAGNOSIS — M778 Other enthesopathies, not elsewhere classified: Secondary | ICD-10-CM

## 2023-10-22 DIAGNOSIS — E1142 Type 2 diabetes mellitus with diabetic polyneuropathy: Secondary | ICD-10-CM | POA: Diagnosis not present

## 2023-10-22 MED ORDER — TRIAMCINOLONE ACETONIDE 10 MG/ML IJ SUSP
2.5000 mg | Freq: Once | INTRAMUSCULAR | Status: AC
Start: 2023-10-22 — End: 2023-10-22
  Administered 2023-10-22: 2.5 mg via INTRA_ARTICULAR

## 2023-10-22 MED ORDER — DEXAMETHASONE SODIUM PHOSPHATE 120 MG/30ML IJ SOLN
4.0000 mg | Freq: Once | INTRAMUSCULAR | Status: AC
Start: 2023-10-22 — End: 2023-10-22
  Administered 2023-10-22: 4 mg via INTRA_ARTICULAR

## 2023-10-22 NOTE — Progress Notes (Signed)
  Subjective:  Patient ID: Paige Andrews, female    DOB: 04/16/1956,   MRN: [id number]  Chief Complaint  Patient presents with   Foot Pain    Foot/ankle left - noticed after she started wearing her diabetic shoes her foot and ankle started to hurt really bad, swollen, tried ice, barely can walk, pinpoints pain to plantar forefoot, lateral ankle and dorsal midfoot, no injury    67 y.o. female presents for concern as above. She was recently dispensed her diabetic shoes and relates it started after this. Relates burning and tingling in their feet. Patient is diabetic and last A1c was  Lab Results  Component Value Date   HGBA1C 8.4 (H) 04/14/2023   . No history of gout or any history of certain foods recently consumed.   PCP:  Felix Pacini, FNP    . Denies any other pedal complaints. Denies n/v/f/c.   Past Medical History:  Diagnosis Date   ADD (attention deficit disorder)    Anemia    Anxiety    Arthritis    Asthma    Binge eating disorder    Bipolar 1 disorder (HCC)    Celiac disease    Chronic lumbar pain    Depression    Diabetes (HCC)    GERD (gastroesophageal reflux disease)    Hypercholesterolemia    Hypertension    Hyperthyroidism    IBS (irritable bowel syndrome)    Migraine    OSA (obstructive sleep apnea)    NO CPAP CURRENTLY   Pneumonia    PTSD (post-traumatic stress disorder)     Objective:  Physical Exam: Vascular: DP/PT pulses 2/4 bilateral. CFT <3 seconds. Absent hair growth on digits. Edema noted to bilateral lower extremities. Xerosis noted bilaterally.  Skin. No lacerations or abrasions bilateral feet. Nails 1-5 bilateral  are normal in appearance. Dry scaling cracked plantar foot skin bilateral.   Musculoskeletal: MMT 5/5 bilateral lower extremities in DF, PF, Inversion and Eversion. Deceased ROM in DF of ankle joint.  Left lower extremity 1 cm shorter than right.  Tender over the midtarsal joints exquisitely. No pain around ankle or subtalar  joints. Edema and mild erythema noted as well. No pain with ankle or subtalar joint ROM. Pain with midtarsal joint ROM.  Neurological: Sensation intact to light touch. Protective sensation diminished bilateral.    Assessment:   1. Capsulitis of left foot   2. Type 2 diabetes mellitus with diabetic polyneuropathy, without long-term current use of insulin (HCC)      Plan:  Patient was evaluated and treated and all questions answered. -Xrays reviewed -Discussed treatement options for gouty arthritis and gout education provided. -Patient opted for injection. After oral consent, injected left TN and CC joint with 1cc lidocaine and marcaine plain mixed with 1 cc Dexmethasone phosphate and 0.5 cc of kenalog without complication; post injection care explained. -Discussed diet and modifications.  -Ordered arthritic lab panel; will call patient with results if abnormal -Advised patient to call if symptoms are not improved within 1 week -Patient to return in 3 weeks for re-check/further discussion for long term management of gout or sooner if condition worsens.   Louann Sjogren, DPM

## 2023-10-22 NOTE — Patient Instructions (Signed)
Low-Purine Eating Plan A low-purine eating plan involves making food choices to limit your purine intake. Purine is a kind of uric acid. Too much uric acid in your blood can cause certain conditions, such as gout and kidney stones. Eating a low-purine diet may help control these conditions. What are tips for following this plan? Shopping Avoid buying products that contain high-fructose corn syrup. Check for this on food labels. It is commonly found in many processed foods and soft drinks. Be sure to check for it in baked goods such as cookies, canned fruits, and cereals and cereal bars. Avoid buying veal, chicken breast with skin, lamb, and organ meats such as liver. These types of meats tend to have the highest purine content. Choose dairy products. These may lower uric acid levels. Avoid certain types of fish. Not all fish and seafood have high purine content. Examples with high purine content include anchovies, trout, tuna, sardines, and salmon. Avoid buying beverages that contain alcohol, particularly beer and hard liquor. Alcohol can affect the way your body gets rid of uric acid. Meal planning  Learn which foods do or do not affect you. If you find out that a food tends to cause your gout symptoms to flare up, avoid eating that food. You can enjoy foods that do not cause problems. If you have any questions about a food item, talk with your dietitian or health care provider. Reduce the overall amount of meat in your diet. When you do eat meat, choose ones with lower purine content. Include plenty of fruits and vegetables. Although some vegetables may have a high purine content--such as asparagus, mushrooms, spinach, or cauliflower--it has been shown that these do not contribute to uric acid blood levels as much. Consume at least 1 dairy serving a day. This has been shown to decrease uric acid levels. General information If you drink alcohol: Limit how much you have to: 0-1 drink a day for  women who are not pregnant. 0-2 drinks a day for men. Know how much alcohol is in a drink. In the [place] one drink equals one 12 oz bottle of beer (355 mL), one 5 oz glass of wine (148 mL), or one 1 oz glass of hard liquor (44 mL). Drink plenty of water. Try to drink enough to keep your urine pale yellow. Fluids can help remove uric acid from your body. Work with your health care provider and dietitian to develop a plan to achieve or maintain a healthy weight. Losing weight may help reduce uric acid in your blood. What foods are recommended? The following are some types of foods that are good choices when limiting purine intake: Fresh or frozen fruits and vegetables. Whole grains, breads, cereals, and pasta. Rice. Beans, peas, legumes. Nuts and seeds. Dairy products. Fats and oils. The items listed above may not be a complete list. Talk with a dietitian about what dietary choices are best for you. What foods are not recommended? Limit your intake of foods high in purines, including: Beer and other alcohol. Meat-based gravy or sauce. Canned or fresh fish, such as: Anchovies, sardines, herring, salmon, and tuna. Mussels and scallops. Codfish, trout, and haddock. Bacon, veal, chicken breast with skin, and lamb. Organ meats, such as: Liver or kidney. Tripe. Sweetbreads (thymus gland or pancreas). Wild Education officer, environmental. Yeast or yeast extract supplements. Drinks sweetened with high-fructose corn syrup, such as soda. Processed foods made with high-fructose corn syrup. The items listed above may not be a complete list of foods  and beverages you should limit. Contact a dietitian for more information. Summary Eating a low-purine diet may help control conditions caused by too much uric acid in the body, such as gout or kidney stones. Choose low-purine foods, limit alcohol, and limit high-fructose corn syrup. You will learn over time which foods do or do not affect you. If you find out that a  food tends to cause your gout symptoms to flare up, avoid eating that food. This information is not intended to replace advice given to you by your health care provider. Make sure you discuss any questions you have with your health care provider. Document Revised: 11/10/2021 Document Reviewed: 11/10/2021 [employer]vier Patient Education  2024 [employer].

## 2023-10-24 LAB — ARTHRITIS PANEL
Anti Nuclear Antibody (ANA): NEGATIVE
Rheumatoid fact SerPl-aCnc: 10.2 [IU]/mL (ref <14.0)
Sed Rate: 2 mm/h (ref 0–40)
Uric Acid: 4.9 mg/dL (ref 3.0–7.2)

## 2023-11-13 ENCOUNTER — Telehealth: Payer: Self-pay

## 2023-11-13 NOTE — Telephone Encounter (Signed)
Patient called and left a message - she is asking about her labs from a few weeks ago - looks like you did an arthritis panel. Her swelling is not as bad, but she is still having pain, and she has new pain in her knee - please advise if labs show any cause for her symtpoms - thanks

## 2023-11-13 NOTE — Telephone Encounter (Signed)
Labs were normal and no obvious cause for her symptoms.  Thanks
